# Patient Record
Sex: Female | Born: 1955 | Race: White | Hispanic: No | Marital: Married | Smoking: Former smoker
Health system: Southern US, Community
[De-identification: ages and names within clinical notes are randomized; demographics above are authoritative.]

## PROBLEM LIST (undated history)

## (undated) DIAGNOSIS — C801 Malignant (primary) neoplasm, unspecified: Secondary | ICD-10-CM

## (undated) DIAGNOSIS — S61011A Laceration without foreign body of right thumb without damage to nail, initial encounter: Secondary | ICD-10-CM

## (undated) DIAGNOSIS — T7840XA Allergy, unspecified, initial encounter: Secondary | ICD-10-CM

## (undated) DIAGNOSIS — I10 Essential (primary) hypertension: Secondary | ICD-10-CM

## (undated) DIAGNOSIS — F419 Anxiety disorder, unspecified: Secondary | ICD-10-CM

## (undated) DIAGNOSIS — N6019 Diffuse cystic mastopathy of unspecified breast: Secondary | ICD-10-CM

## (undated) DIAGNOSIS — E785 Hyperlipidemia, unspecified: Secondary | ICD-10-CM

## (undated) DIAGNOSIS — F32A Depression, unspecified: Secondary | ICD-10-CM

## (undated) DIAGNOSIS — E559 Vitamin D deficiency, unspecified: Secondary | ICD-10-CM

## (undated) DIAGNOSIS — F329 Major depressive disorder, single episode, unspecified: Secondary | ICD-10-CM

## (undated) DIAGNOSIS — N951 Menopausal and female climacteric states: Secondary | ICD-10-CM

## (undated) DIAGNOSIS — E7800 Pure hypercholesterolemia, unspecified: Secondary | ICD-10-CM

## (undated) HISTORY — DX: Allergy, unspecified, initial encounter: T78.40XA

## (undated) HISTORY — DX: Diffuse cystic mastopathy of unspecified breast: N60.19

## (undated) HISTORY — DX: Hyperlipidemia, unspecified: E78.5

## (undated) HISTORY — DX: Malignant (primary) neoplasm, unspecified: C80.1

## (undated) HISTORY — DX: Essential (primary) hypertension: I10

## (undated) HISTORY — DX: Vitamin D deficiency, unspecified: E55.9

## (undated) HISTORY — PX: TUBAL LIGATION: SHX77

## (undated) HISTORY — DX: Menopausal and female climacteric states: N95.1

---
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
## 1997-11-27 ENCOUNTER — Emergency Department (HOSPITAL_COMMUNITY): Admission: EM | Admit: 1997-11-27 | Discharge: 1997-11-27 | Payer: Self-pay | Admitting: Emergency Medicine

## 1997-11-27 ENCOUNTER — Encounter: Payer: Self-pay | Admitting: Emergency Medicine

## 1998-12-26 ENCOUNTER — Other Ambulatory Visit: Admission: RE | Admit: 1998-12-26 | Discharge: 1998-12-26 | Payer: Self-pay | Admitting: Internal Medicine

## 2001-01-04 ENCOUNTER — Other Ambulatory Visit: Admission: RE | Admit: 2001-01-04 | Discharge: 2001-01-04 | Payer: Self-pay | Admitting: Internal Medicine

## 2004-02-16 LAB — HM COLONOSCOPY

## 2004-06-17 ENCOUNTER — Other Ambulatory Visit: Admission: RE | Admit: 2004-06-17 | Discharge: 2004-06-17 | Payer: Self-pay | Admitting: Internal Medicine

## 2004-06-29 ENCOUNTER — Ambulatory Visit (HOSPITAL_COMMUNITY): Admission: RE | Admit: 2004-06-29 | Discharge: 2004-06-29 | Payer: Self-pay | Admitting: Internal Medicine

## 2004-07-10 ENCOUNTER — Ambulatory Visit: Payer: Self-pay | Admitting: Gastroenterology

## 2004-07-31 ENCOUNTER — Ambulatory Visit: Payer: Self-pay | Admitting: Gastroenterology

## 2005-08-12 ENCOUNTER — Other Ambulatory Visit: Admission: RE | Admit: 2005-08-12 | Discharge: 2005-08-12 | Payer: Self-pay | Admitting: Internal Medicine

## 2006-04-15 ENCOUNTER — Emergency Department (HOSPITAL_COMMUNITY): Admission: EM | Admit: 2006-04-15 | Discharge: 2006-04-15 | Payer: Self-pay | Admitting: Emergency Medicine

## 2007-12-20 ENCOUNTER — Other Ambulatory Visit: Admission: RE | Admit: 2007-12-20 | Discharge: 2007-12-20 | Payer: Self-pay | Admitting: Internal Medicine

## 2007-12-20 ENCOUNTER — Ambulatory Visit (HOSPITAL_COMMUNITY): Admission: RE | Admit: 2007-12-20 | Discharge: 2007-12-20 | Payer: Self-pay | Admitting: Internal Medicine

## 2009-08-08 ENCOUNTER — Ambulatory Visit (HOSPITAL_COMMUNITY): Admission: RE | Admit: 2009-08-08 | Discharge: 2009-08-08 | Payer: Self-pay | Admitting: Internal Medicine

## 2010-09-01 ENCOUNTER — Other Ambulatory Visit (HOSPITAL_COMMUNITY): Payer: Self-pay | Admitting: Internal Medicine

## 2010-09-01 DIAGNOSIS — Z1231 Encounter for screening mammogram for malignant neoplasm of breast: Secondary | ICD-10-CM

## 2010-09-09 ENCOUNTER — Ambulatory Visit (HOSPITAL_COMMUNITY)
Admission: RE | Admit: 2010-09-09 | Discharge: 2010-09-09 | Disposition: A | Discharge status: Home / Self Care | Payer: 59 | Source: Ambulatory Visit | Attending: Internal Medicine | Admitting: Internal Medicine

## 2010-09-09 DIAGNOSIS — Z1231 Encounter for screening mammogram for malignant neoplasm of breast: Secondary | ICD-10-CM | POA: Insufficient documentation

## 2012-06-07 ENCOUNTER — Other Ambulatory Visit (HOSPITAL_COMMUNITY)
Admission: RE | Admit: 2012-06-07 | Discharge: 2012-06-07 | Disposition: A | Discharge status: Home / Self Care | Payer: 59 | Source: Ambulatory Visit | Attending: Internal Medicine | Admitting: Internal Medicine

## 2012-06-07 DIAGNOSIS — Z1151 Encounter for screening for human papillomavirus (HPV): Secondary | ICD-10-CM | POA: Insufficient documentation

## 2012-06-07 DIAGNOSIS — Z01419 Encounter for gynecological examination (general) (routine) without abnormal findings: Secondary | ICD-10-CM | POA: Insufficient documentation

## 2012-07-13 ENCOUNTER — Encounter: Payer: Self-pay | Admitting: Gastroenterology

## 2012-12-01 ENCOUNTER — Encounter: Payer: Self-pay | Admitting: Internal Medicine

## 2012-12-01 DIAGNOSIS — E559 Vitamin D deficiency, unspecified: Secondary | ICD-10-CM | POA: Insufficient documentation

## 2012-12-01 DIAGNOSIS — E785 Hyperlipidemia, unspecified: Secondary | ICD-10-CM | POA: Insufficient documentation

## 2012-12-01 DIAGNOSIS — I10 Essential (primary) hypertension: Secondary | ICD-10-CM | POA: Insufficient documentation

## 2012-12-21 ENCOUNTER — Ambulatory Visit: Payer: Self-pay | Admitting: Emergency Medicine

## 2013-03-07 ENCOUNTER — Encounter: Payer: Self-pay | Admitting: Internal Medicine

## 2013-03-07 ENCOUNTER — Ambulatory Visit (INDEPENDENT_AMBULATORY_CARE_PROVIDER_SITE_OTHER): Payer: 59 | Admitting: Internal Medicine

## 2013-03-07 VITALS — BP 150/90 | HR 76 | Temp 97.9°F | Resp 16 | Ht 66.0 in | Wt 165.0 lb

## 2013-03-07 DIAGNOSIS — R7401 Elevation of levels of liver transaminase levels: Secondary | ICD-10-CM

## 2013-03-07 DIAGNOSIS — Z0000 Encounter for general adult medical examination without abnormal findings: Secondary | ICD-10-CM

## 2013-03-07 DIAGNOSIS — N6019 Diffuse cystic mastopathy of unspecified breast: Secondary | ICD-10-CM

## 2013-03-07 DIAGNOSIS — Z111 Encounter for screening for respiratory tuberculosis: Secondary | ICD-10-CM

## 2013-03-07 DIAGNOSIS — E782 Mixed hyperlipidemia: Secondary | ICD-10-CM

## 2013-03-07 DIAGNOSIS — Z1212 Encounter for screening for malignant neoplasm of rectum: Secondary | ICD-10-CM

## 2013-03-07 DIAGNOSIS — F411 Generalized anxiety disorder: Secondary | ICD-10-CM

## 2013-03-07 DIAGNOSIS — Z113 Encounter for screening for infections with a predominantly sexual mode of transmission: Secondary | ICD-10-CM

## 2013-03-07 DIAGNOSIS — I10 Essential (primary) hypertension: Secondary | ICD-10-CM

## 2013-03-07 DIAGNOSIS — J019 Acute sinusitis, unspecified: Secondary | ICD-10-CM

## 2013-03-07 DIAGNOSIS — E559 Vitamin D deficiency, unspecified: Secondary | ICD-10-CM

## 2013-03-07 DIAGNOSIS — R740 Nonspecific elevation of levels of transaminase and lactic acid dehydrogenase [LDH]: Secondary | ICD-10-CM

## 2013-03-07 LAB — CBC WITH DIFFERENTIAL/PLATELET
BASOS PCT: 1 % (ref 0–1)
Basophils Absolute: 0 10*3/uL (ref 0.0–0.1)
EOS ABS: 0 10*3/uL (ref 0.0–0.7)
EOS PCT: 1 % (ref 0–5)
HEMATOCRIT: 43.2 % (ref 36.0–46.0)
Hemoglobin: 15.7 g/dL — ABNORMAL HIGH (ref 12.0–15.0)
Lymphocytes Relative: 44 % (ref 12–46)
Lymphs Abs: 1.9 10*3/uL (ref 0.7–4.0)
MCH: 32.7 pg (ref 26.0–34.0)
MCHC: 36.3 g/dL — AB (ref 30.0–36.0)
MCV: 90 fL (ref 78.0–100.0)
Monocytes Absolute: 0.4 10*3/uL (ref 0.1–1.0)
Monocytes Relative: 10 % (ref 3–12)
NEUTROS PCT: 44 % (ref 43–77)
Neutro Abs: 2 10*3/uL (ref 1.7–7.7)
Platelets: 253 10*3/uL (ref 150–400)
RBC: 4.8 MIL/uL (ref 3.87–5.11)
RDW: 13.8 % (ref 11.5–15.5)
WBC: 4.4 10*3/uL (ref 4.0–10.5)

## 2013-03-07 LAB — HEPATIC FUNCTION PANEL
ALT: 26 U/L (ref 0–35)
AST: 23 U/L (ref 0–37)
Albumin: 4.6 g/dL (ref 3.5–5.2)
Alkaline Phosphatase: 79 U/L (ref 39–117)
BILIRUBIN DIRECT: 0.1 mg/dL (ref 0.0–0.3)
BILIRUBIN INDIRECT: 0.4 mg/dL (ref 0.0–0.9)
Total Bilirubin: 0.5 mg/dL (ref 0.3–1.2)
Total Protein: 7 g/dL (ref 6.0–8.3)

## 2013-03-07 LAB — BASIC METABOLIC PANEL WITH GFR
BUN: 9 mg/dL (ref 6–23)
CO2: 29 mEq/L (ref 19–32)
Calcium: 10.1 mg/dL (ref 8.4–10.5)
Chloride: 97 mEq/L (ref 96–112)
Creat: 0.66 mg/dL (ref 0.50–1.10)
GFR, Est Non African American: >89 mL/min
GLUCOSE: 97 mg/dL (ref 70–99)
Potassium: 3.6 mEq/L (ref 3.5–5.3)
Sodium: 136 mEq/L (ref 135–145)

## 2013-03-07 LAB — HEPATITIS B CORE ANTIBODY, TOTAL: HEP B C TOTAL AB: NONREACTIVE

## 2013-03-07 LAB — LIPID PANEL
CHOLESTEROL: 209 mg/dL — AB (ref 0–200)
HDL: 58 mg/dL (ref >39)
LDL Cholesterol: 130 mg/dL — ABNORMAL HIGH (ref 0–99)
TRIGLYCERIDES: 106 mg/dL (ref <150)
Total CHOL/HDL Ratio: 3.6 Ratio
VLDL: 21 mg/dL (ref 0–40)

## 2013-03-07 LAB — TSH: TSH: 1.463 u[IU]/mL (ref 0.350–4.500)

## 2013-03-07 LAB — HIV ANTIBODY (ROUTINE TESTING W REFLEX): HIV: NONREACTIVE

## 2013-03-07 LAB — HEPATITIS A ANTIBODY, TOTAL: HEP A TOTAL AB: NONREACTIVE

## 2013-03-07 LAB — HEPATITIS C ANTIBODY: HCV Ab: NEGATIVE

## 2013-03-07 LAB — MAGNESIUM: Magnesium: 2 mg/dL (ref 1.5–2.5)

## 2013-03-07 LAB — HEPATITIS B SURFACE ANTIBODY,QUALITATIVE: Hep B S Ab: NEGATIVE

## 2013-03-07 LAB — VITAMIN B12: VITAMIN B 12: 586 pg/mL (ref 211–911)

## 2013-03-07 LAB — HEMOGLOBIN A1C
Hgb A1c MFr Bld: 5.4 % (ref <5.7)
MEAN PLASMA GLUCOSE: 108 mg/dL (ref <117)

## 2013-03-07 LAB — RPR

## 2013-03-07 MED ORDER — PREDNISONE 20 MG PO TABS
20.0000 mg | ORAL_TABLET | ORAL | Status: AC
Start: 2013-03-07 — End: 2013-04-07

## 2013-03-07 MED ORDER — PRAVASTATIN SODIUM 40 MG PO TABS: 40.0000 mg | ORAL_TABLET | Freq: Every day | ORAL | Status: DC

## 2013-03-07 MED ORDER — AZITHROMYCIN 250 MG PO TABS: ORAL_TABLET | ORAL | Status: AC

## 2013-03-07 MED ORDER — ALPRAZOLAM 0.25 MG PO TABS: 0.2500 mg | ORAL_TABLET | Freq: Three times a day (TID) | ORAL | Status: DC | PRN

## 2013-03-07 NOTE — Patient Instructions (Signed)

## 2013-03-07 NOTE — Progress Notes (Signed)
Patient ID: Ruth Ward, female   DOB: [birth year], 58 y.o.   MRN: [id number]  Annual Screening Comprehensive Examination  This very nice 58 y.o. MWF presents for complete physical.  Patient has been followed for HTN, Hx/o abnormal glucose, Hyperlipidemia, and Vitamin D Deficiency. She alsoc/o sinus pressure, congestion, and mucopurulent nasal secretions.    HTN predates since  2000. Patient's BP has not been monitored at home and in fact she has been out of her HS Atenolol. Today's BP: 150/90 mmHg. Patient denies any cardiac symptoms as chest pain, palpitations, shortness of breath, dizziness or ankle swelling.   Patient's hyperlipidemia is controlled with diet and medications. Patient denies myalgias or other medication SE's. Last cholesterol last visit was 166, triglycerides 308, HDL 48 and LDL 56 at goal.     Patient is monitored for prediabetes and insulin resistance with last A1c 5.2%. Patient denies reactive hypoglycemic symptoms, visual blurring, diabetic polys, or paresthesias.     Finally, patient has history of Vitamin D Deficiency of 60 in 2008 with last vitamin D 76.       Medication List         ALPRAZolam 0.25 MG tablet  Commonly known as:  XANAX  Take 1 tablet (0.25 mg total) by mouth 3 (three) times daily as needed for anxiety.     aspirin 81 MG tablet  Take 81 mg by mouth daily.     atenolol 100 MG tablet  Commonly known as:  TENORMIN  Take 100 mg by mouth daily. 1/2 DAILY (50MG )     azithromycin 250 MG tablet  Commonly known as:  ZITHROMAX  Take 2 tablets (500 mg) on  Day 1,  followed by 1 tablet (250 mg) once daily on Days 2 through 5.     hydrochlorothiazide 25 MG tablet  Commonly known as:  HYDRODIURIL  Take 25 mg by mouth daily.     pravastatin 40 MG tablet  Commonly known as:  PRAVACHOL  Take 1 tablet (40 mg total) by mouth daily.     predniSONE 20 MG tablet  Commonly known as:  DELTASONE  Take 1 tablet (20 mg total) by mouth See admin  instructions. 1 tab 3 x day for 3 days, then 1 tab 2 x day for 3 days, then 1 tab 1 x day for 5 days     Vitamin D 2000 UNITS tablet  Take 2,000 Units by mouth 2 (two) times daily.        Allergies  Allergen Reactions  . Lipitor [Atorvastatin]     Past Medical History  Diagnosis Date  . Hypertension   . Hyperlipidemia   . Vitamin D deficiency   . Climacteric   . Fibrocystic breast   . Allergy     Past Surgical History  Procedure Laterality Date  . Cesarean section  1975 and 1980  . Tubal ligation      Family History  Problem Relation Age of Onset  . Hypertension Mother   . Diabetes Mother   . Hypertension Father   . Heart disease Father   . Cancer Father   . Hypertension Brother   . Diabetes Other     History  Substance Use Topics  . Smoking status: Former Smoker    Quit date: 02/15/1986  . Smokeless tobacco: Not on file  . Alcohol Use: No    ROS Constitutional: Denies fever, chills, weight loss/gain, headaches, insomnia, fatigue, night sweats, and change in appetite. Eyes: Denies redness, blurred vision, diplopia, discharge,  itchy, watery eyes.  ENT: Denies discharge, post nasal drip, epistaxis, sore throat, earache, hearing loss, dental pain, Tinnitus, Vertigo, , snoring.  Cardio: Denies chest pain, palpitations, irregular heartbeat, syncope, dyspnea, diaphoresis, orthopnea, PND, claudication, edema/. Has sinus pressure, congestion as above. Respiratory: denies cough, dyspnea, DOE, pleurisy, hoarseness, laryngitis, wheezing.  Gastrointestinal: Denies dysphagia, heartburn, reflux, water brash, pain, cramps, nausea, vomiting, bloating, diarrhea, constipation, hematemesis, melena, hematochezia, jaundice, hemorrhoids Genitourinary: Denies dysuria, frequency, urgency, nocturia, hesitancy, discharge, hematuria, flank pain Breast:Breast lumps, nipple discharge, bleeding.  Musculoskeletal: Denies arthralgia, myalgia, stiffness, Jt. Swelling, pain, limp, and  strain/sprain. Skin: Denies puritis, rash, hives, warts, acne, eczema, changing in skin lesion Neuro: No weakness, tremor, incoordination, spasms, paresthesia, pain Psychiatric: Denies confusion, memory loss, sensory loss Endocrine: Denies change in weight, skin, hair change, nocturia, and paresthesia, diabetic polys, visual blurring, hyper / hypo glycemic episodes.  Heme/Lymph: No excessive bleeding, bruising, enlarged lymph nodes.  BP: 150/90  Pulse: 76  Temp: 97.9 F (36.6 C)  Resp: 16    Physical Exam General Appearance: Well nourished, in no apparent distress. Eyes: PERRLA, EOMs, conjunctiva no swelling or erythema, normal fundi and vessels. Sinuses: No frontal but + maxillary tenderness ENT/Mouth: EACs patent / TMs  nl. Nares clear without erythema, swelling, mucoid exudates. Oral hygiene is good. No erythema, swelling, or exudate. Tongue normal, non-obstructing. Tonsils not swollen or erythematous. Hearing normal.  Neck: Supple, thyroid normal. No bruits, nodes or JVD. Respiratory: Respiratory effort normal.  BS equal and clear bilateral without rales, rhonci, wheezing or stridor. Cardio: Heart sounds are normal with regular rate and rhythm and no murmurs, rubs or gallops. Peripheral pulses are normal and equal bilaterally without edema. No aortic or femoral bruits. Chest: symmetric with normal excursions and percussion. Breasts: Symmetric, without lumps, nipple discharge, retractions, or fibrocystic changes.  Abdomen: Flat, soft, with bowl sounds. Nontender, no guarding, rebound, hernias, masses, or organomegaly.  Lymphatics: Non tender without lymphadenopathy.  Musculoskeletal: Full ROM all peripheral extremities, joint stability, 5/5 strength, and normal gait. Skin: Warm and dry without rashes, lesions, cyanosis, clubbing or  ecchymosis.  Neuro: Cranial nerves intact, reflexes equal bilaterally. Normal muscle tone, no cerebellar symptoms. Sensation intact.  Pysch: Awake and  oriented X 3, normal affect, Insight and Judgment appropriate.   Assessment and Plan  1. Annual Screening Examination 2. Hypertension  3. Hyperlipidemia 4. Pre Diabetes Screening 5. Vitamin D Deficiency 6. Sinusitis  Continue prudent diet as discussed, weight control, BP monitoring, regular exercise, and medications. Discussed med's effects and SE's. Screening labs and tests as requested with regular follow-up as recommended.

## 2013-03-08 LAB — URINALYSIS, MICROSCOPIC ONLY
BACTERIA UA: NONE SEEN
Casts: NONE SEEN
Crystals: NONE SEEN
SQUAMOUS EPITHELIAL / LPF: NONE SEEN

## 2013-03-08 LAB — VITAMIN D 25 HYDROXY (VIT D DEFICIENCY, FRACTURES): VIT D 25 HYDROXY: 84 ng/mL (ref 30–89)

## 2013-03-08 LAB — MICROALBUMIN / CREATININE URINE RATIO
CREATININE, URINE: 86.5 mg/dL
MICROALB UR: 1.1 mg/dL (ref 0.00–1.89)
MICROALB/CREAT RATIO: 12.7 mg/g (ref 0.0–30.0)

## 2013-03-08 LAB — HEPATITIS B E ANTIBODY: HEPATITIS BE ANTIBODY: NEGATIVE

## 2013-03-08 LAB — INSULIN, FASTING: INSULIN FASTING, SERUM: 11 u[IU]/mL (ref 3–28)

## 2013-03-09 ENCOUNTER — Encounter: Payer: Self-pay | Admitting: Internal Medicine

## 2013-03-09 LAB — TB SKIN TEST
Induration: 0 mm
TB Skin Test: NEGATIVE

## 2013-03-12 ENCOUNTER — Other Ambulatory Visit: Payer: Self-pay | Admitting: Emergency Medicine

## 2013-03-12 ENCOUNTER — Encounter: Payer: Self-pay | Admitting: Internal Medicine

## 2013-03-12 MED ORDER — HYDROCHLOROTHIAZIDE 25 MG PO TABS: 25.0000 mg | ORAL_TABLET | Freq: Every day | ORAL | Status: DC

## 2013-04-02 ENCOUNTER — Ambulatory Visit (HOSPITAL_COMMUNITY): Admission: RE | Admit: 2013-04-02 | Payer: 59 | Source: Ambulatory Visit

## 2013-04-09 ENCOUNTER — Other Ambulatory Visit: Payer: Self-pay | Admitting: Internal Medicine

## 2013-04-09 ENCOUNTER — Ambulatory Visit (HOSPITAL_COMMUNITY)
Admission: RE | Admit: 2013-04-09 | Discharge: 2013-04-09 | Disposition: A | Discharge status: Home / Self Care | Payer: 59 | Source: Ambulatory Visit | Attending: Internal Medicine | Admitting: Internal Medicine

## 2013-04-09 DIAGNOSIS — Z1231 Encounter for screening mammogram for malignant neoplasm of breast: Secondary | ICD-10-CM | POA: Insufficient documentation

## 2013-04-09 DIAGNOSIS — N6019 Diffuse cystic mastopathy of unspecified breast: Secondary | ICD-10-CM

## 2013-05-08 ENCOUNTER — Encounter: Payer: Self-pay | Admitting: Internal Medicine

## 2013-05-08 ENCOUNTER — Ambulatory Visit (INDEPENDENT_AMBULATORY_CARE_PROVIDER_SITE_OTHER): Payer: 59 | Admitting: Internal Medicine

## 2013-05-08 VITALS — BP 122/76 | HR 56 | Temp 98.1°F | Resp 18 | Ht 66.0 in | Wt 161.0 lb

## 2013-05-08 DIAGNOSIS — F411 Generalized anxiety disorder: Secondary | ICD-10-CM

## 2013-05-08 DIAGNOSIS — I10 Essential (primary) hypertension: Secondary | ICD-10-CM

## 2013-05-08 MED ORDER — SERTRALINE HCL 50 MG PO TABS: 50.0000 mg | ORAL_TABLET | Freq: Every day | ORAL | Status: DC

## 2013-05-08 MED ORDER — ALPRAZOLAM 1 MG PO TABS: ORAL_TABLET | ORAL | Status: DC

## 2013-05-08 NOTE — Progress Notes (Signed)
   Subjective:    Patient ID: Ruth Ward, female    DOB: [birth year], 58 y.o.   MRN: [id number]  HPI Very nice 58 yo MWF presents for F/U of elevated BP 159/90 six weeks ago at her annual CPE. Since then she has been dealing with anxiety issues arising from some marital challenges which she and her spouse are working to try and resolve. She has had more frequent anxiety episode and hence has been using her alprazolam more frequently.  Medication Sig  . aspirin 81 MG tablet Take 81 mg by mouth daily.  Marland Kitchen atenolol (TENORMIN) 100 MG tablet Take 100 mg by mouth daily. 1/2 DAILY (50MG )  . Cholecalciferol (VITAMIN D) 2000 UNITS tablet Take 2,000 Units by mouth 2 (two) times daily.  . hydrochlorothiazide (HYDRODIURIL) 25 MG tablet Take 1 tablet (25 mg total) by mouth daily.  . pravastatin (PRAVACHOL) 40 MG tablet Take 1 tablet (40 mg total) by mouth daily.   Allergies  Allergen Reactions  . Lipitor [Atorvastatin]    Past Medical History  Diagnosis Date  . Hypertension   . Hyperlipidemia   . Vitamin D deficiency   . Climacteric   . Fibrocystic breast   . Allergy   Review of Systems   Negative to above.     Objective:   Physical Exam  BP 122/76  Pulse 56  Temp98.1 F   Resp 18  Ht 5\' 6"    Wt 161 lb   BMI 26.00 kg/m2  HEENT - Eac's patent. TM's Nl.EOM's full. PERRLA. NasoOroPharynx clear. Neck - supple. Nl Thyroid. No bruits nodes JVD Chest - Clear equal BS Cor - Nl HS. RRR w/o sig MGR. PP 1(+) No edema. Abd - No palpable organomegaly, masses or tenderness. BS nl. MS- FROM. w/o deformities. Muscle power tone and bulk Nl. Gait Nl. Neuro - No obvious Cr N abnormalities. Sensory, motor and Cerebellar functions appear Nl w/o focal abnormalities. Psyche - stable w/o ideations, delusions or hallucinations  Assessment & Plan:  1. Hypertension  2. Anxiety  - sertraline (ZOLOFT) 50 MG tablet; Take 1 tablet (50 mg total) by mouth daily. For mood  To start 1/2 tab 1st two weeks  then increase to 1 whole tab daily  - ALPRAZolam (XANAX) 1 MG tablet; Talke 1/2 to 1 tablet 2 to 3 x daily as needed for anxiety  Dispense: 90 tablet; Refill: 3 and instructed to begin to try and wean in 2 or 3 weeks after she's been on the sertraline.  - ROV 6-8 weeks

## 2013-06-05 ENCOUNTER — Ambulatory Visit: Payer: Self-pay | Admitting: Physician Assistant

## 2013-06-06 ENCOUNTER — Other Ambulatory Visit: Payer: Self-pay | Admitting: Emergency Medicine

## 2013-06-06 ENCOUNTER — Encounter: Payer: Self-pay | Admitting: Internal Medicine

## 2013-06-06 DIAGNOSIS — E782 Mixed hyperlipidemia: Secondary | ICD-10-CM

## 2013-06-06 DIAGNOSIS — F411 Generalized anxiety disorder: Secondary | ICD-10-CM

## 2013-06-06 MED ORDER — PRAVASTATIN SODIUM 40 MG PO TABS: 40.0000 mg | ORAL_TABLET | Freq: Every day | ORAL | Status: DC

## 2013-06-06 MED ORDER — SERTRALINE HCL 50 MG PO TABS: 50.0000 mg | ORAL_TABLET | Freq: Every day | ORAL | Status: DC

## 2013-06-08 ENCOUNTER — Ambulatory Visit: Payer: Self-pay | Admitting: Physician Assistant

## 2013-07-05 ENCOUNTER — Ambulatory Visit (INDEPENDENT_AMBULATORY_CARE_PROVIDER_SITE_OTHER): Payer: 59 | Admitting: Internal Medicine

## 2013-07-05 ENCOUNTER — Encounter: Payer: Self-pay | Admitting: Internal Medicine

## 2013-07-05 VITALS — BP 128/76 | HR 60 | Temp 99.0°F | Resp 16

## 2013-07-05 DIAGNOSIS — F411 Generalized anxiety disorder: Secondary | ICD-10-CM

## 2013-07-05 DIAGNOSIS — I10 Essential (primary) hypertension: Secondary | ICD-10-CM
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
## 2013-07-09 ENCOUNTER — Other Ambulatory Visit: Payer: Self-pay | Admitting: Emergency Medicine

## 2013-07-27 ENCOUNTER — Encounter: Payer: Self-pay | Admitting: Internal Medicine

## 2013-09-10 ENCOUNTER — Ambulatory Visit: Payer: Self-pay | Admitting: Internal Medicine

## 2013-10-09 ENCOUNTER — Encounter: Payer: Self-pay | Admitting: Internal Medicine

## 2013-10-09 ENCOUNTER — Ambulatory Visit (INDEPENDENT_AMBULATORY_CARE_PROVIDER_SITE_OTHER): Payer: 59 | Admitting: Internal Medicine

## 2013-10-09 VITALS — BP 126/72 | HR 56 | Temp 98.6°F | Resp 16 | Ht 66.0 in | Wt 140.0 lb

## 2013-10-09 DIAGNOSIS — Z79899 Other long term (current) drug therapy: Secondary | ICD-10-CM

## 2013-10-09 DIAGNOSIS — R7309 Other abnormal glucose: Secondary | ICD-10-CM | POA: Insufficient documentation

## 2013-10-09 DIAGNOSIS — E785 Hyperlipidemia, unspecified: Secondary | ICD-10-CM

## 2013-10-09 DIAGNOSIS — I10 Essential (primary) hypertension: Secondary | ICD-10-CM

## 2013-10-09 DIAGNOSIS — E559 Vitamin D deficiency, unspecified: Secondary | ICD-10-CM

## 2013-10-09 DIAGNOSIS — F411 Generalized anxiety disorder: Secondary | ICD-10-CM

## 2013-10-09 LAB — HEMOGLOBIN A1C
HEMOGLOBIN A1C: 5.3 % (ref <5.7)
MEAN PLASMA GLUCOSE: 105 mg/dL (ref <117)

## 2013-10-09 LAB — CBC WITH DIFFERENTIAL/PLATELET
BASOS PCT: 0 % (ref 0–1)
Basophils Absolute: 0 10*3/uL (ref 0.0–0.1)
EOS ABS: 0.1 10*3/uL (ref 0.0–0.7)
Eosinophils Relative: 1 % (ref 0–5)
HCT: 41.2 % (ref 36.0–46.0)
Hemoglobin: 14.2 g/dL (ref 12.0–15.0)
LYMPHS ABS: 2.4 10*3/uL (ref 0.7–4.0)
Lymphocytes Relative: 43 % (ref 12–46)
MCH: 31.8 pg (ref 26.0–34.0)
MCHC: 34.5 g/dL (ref 30.0–36.0)
MCV: 92.4 fL (ref 78.0–100.0)
Monocytes Absolute: 0.4 10*3/uL (ref 0.1–1.0)
Monocytes Relative: 7 % (ref 3–12)
NEUTROS PCT: 49 % (ref 43–77)
Neutro Abs: 2.7 10*3/uL (ref 1.7–7.7)
PLATELETS: 212 10*3/uL (ref 150–400)
RBC: 4.46 MIL/uL (ref 3.87–5.11)
RDW: 13 % (ref 11.5–15.5)
WBC: 5.5 10*3/uL (ref 4.0–10.5)

## 2013-10-09 MED ORDER — HYDROCHLOROTHIAZIDE 25 MG PO TABS: 25.0000 mg | ORAL_TABLET | Freq: Every day | ORAL | Status: DC

## 2013-10-09 MED ORDER — ATENOLOL 100 MG PO TABS: 100.0000 mg | ORAL_TABLET | Freq: Every day | ORAL | Status: DC

## 2013-10-09 MED ORDER — ALPRAZOLAM 1 MG PO TABS: ORAL_TABLET | ORAL | Status: DC

## 2013-10-09 NOTE — Progress Notes (Signed)
Patient ID: Ruth Ward, female   DOB: Dec 28, 1955, 58 y.o.   MRN: [id number]   This very nice 58 y.o.MWF presents for 3 month follow up with Hypertension, Hyperlipidemia, Pre-Diabetes and Vitamin D Deficiency.    Patient is treated for HTN & BP has been controlled at home. Today's BP: 126/72 mmHg. Patient denies any cardiac type chest pain, palpitations, dyspnea/orthopnea/PND, dizziness, claudication, or dependent edema.   Hyperlipidemia is controlled with diet & meds. Patient denies myalgias or other med SE's. Last Lipids were Chol 209*; HDL Chol 58; LDL 130*; Trig 106 on 03/07/2013:   Also, the patient has history of PreDiabetes and patient denies any symptoms of reactive hypoglycemia, diabetic polys, paresthesias or visual blurring.  Last A1c was  5.4% on  03/07/2013.    Further, Patient has history of Vitamin D Deficiency and patient supplements vitamin D without any suspected side-effects. Last vitamin D was  84 on  03/07/2013.   Medication List   ALPRAZolam 1 MG tablet  Commonly known as:  XANAX  Tal\ke 1/2 to 1 tablet 2 to 3 x daily as needed for anxiety     aspirin 81 MG tablet  Take 81 mg by mouth daily.     atenolol 100 MG tablet  Commonly known as:  TENORMIN  Take 1 tablet (100 mg total) by mouth daily. For BP     hydrochlorothiazide 25 MG tablet  Commonly known as:  HYDRODIURIL  Take 1 tablet (25 mg total) by mouth daily.     pravastatin 40 MG tablet  Commonly known as:  PRAVACHOL  Take 1 tablet (40 mg total) by mouth daily.     sertraline 50 MG tablet  Commonly known as:  ZOLOFT  Take 1 tablet (50 mg total) by mouth daily. For mood     Vitamin D 2000 UNITS tablet  Take 2,000 Units by mouth 2 (two) times daily.     Allergies  Allergen Reactions  . Lipitor [Atorvastatin]    PMHx:   Past Medical History  Diagnosis Date  . Hypertension   . Hyperlipidemia   . Vitamin D deficiency   . Climacteric   . Fibrocystic breast   . Allergy    FHx:    Reviewed /  unchanged SHx:    Reviewed / unchanged  Systems Review:  Constitutional: Denies fever, chills, wt changes, headaches, insomnia, fatigue, night sweats, change in appetite. Eyes: Denies redness, blurred vision, diplopia, discharge, itchy, watery eyes.  ENT: Denies discharge, congestion, post nasal drip, epistaxis, sore throat, earache, hearing loss, dental pain, tinnitus, vertigo, sinus pain, snoring.  CV: Denies chest pain, palpitations, irregular heartbeat, syncope, dyspnea, diaphoresis, orthopnea, PND, claudication or edema. Respiratory: denies cough, dyspnea, DOE, pleurisy, hoarseness, laryngitis, wheezing.  Gastrointestinal: Denies dysphagia, odynophagia, heartburn, reflux, water brash, abdominal pain or cramps, nausea, vomiting, bloating, diarrhea, constipation, hematemesis, melena, hematochezia  or hemorrhoids. Genitourinary: Denies dysuria, frequency, urgency, nocturia, hesitancy, discharge, hematuria or flank pain. Musculoskeletal: Denies arthralgias, myalgias, stiffness, jt. swelling, pain, limping or strain/sprain.  Skin: Denies pruritus, rash, hives, warts, acne, eczema or change in skin lesion(s). Neuro: No weakness, tremor, incoordination, spasms, paresthesia or pain. Psychiatric: Denies confusion, memory loss or sensory loss. Endo: Denies change in weight, skin or hair change.  Heme/Lymph: No excessive bleeding, bruising or enlarged lymph nodes.  Exam:  BP 126/72  Pulse 56  Temp(Src) 98.6 F (37 C) (Temporal)  Resp 16  Ht 5\' 6"  (1.676 m)  Wt 140 lb (63.504 kg)  BMI 22.61 kg/m2  Appears  well nourished and in no distress. Eyes: PERRLA, EOMs, conjunctiva no swelling or erythema. Sinuses: No frontal/maxillary tenderness ENT/Mouth: EAC's clear, TM's nl w/o erythema, bulging. Nares clear w/o erythema, swelling, exudates. Oropharynx clear without erythema or exudates. Oral hygiene is good. Tongue normal, non obstructing. Hearing intact.  Neck: Supple. Thyroid nl. Car 2+/2+  without bruits, nodes or JVD. Chest: Respirations nl with BS clear & equal w/o rales, rhonchi, wheezing or stridor.  Cor: Heart sounds normal w/ regular rate and rhythm without sig. murmurs, gallops, clicks, or rubs. Peripheral pulses normal and equal  without edema.  Abdomen: Soft & bowel sounds normal. Non-tender w/o guarding, rebound, hernias, masses, or organomegaly.  Lymphatics: Unremarkable.  Musculoskeletal: Full ROM all peripheral extremities, joint stability, 5/5 strength, and normal gait.  Skin: Warm, dry without exposed rashes, lesions or ecchymosis apparent.  Neuro: Cranial nerves intact, reflexes equal bilaterally. Sensory-motor testing grossly intact. Tendon reflexes grossly intact.  Pysch: Alert & oriented x 3.  Insight and judgement nl & appropriate. No ideations.  Assessment and Plan:  1. Hypertension - Continue monitor blood pressure at home. Continue diet/meds same.  2. Hyperlipidemia - Continue diet/meds, exercise,& lifestyle modifications. Continue monitor periodic cholesterol/liver & renal functions   3. Abn. Glucose, screening - Continue diet, exercise, lifestyle modifications. Monitor appropriate labs.   4. Vitamin D Deficiency - Continue supplementation.  Recommended regular exercise, BP monitoring, weight control, and discussed med and SE's. Recommended labs to assess and monitor clinical status. Further disposition pending results of labs.

## 2013-10-09 NOTE — Patient Instructions (Signed)

## 2013-10-10 LAB — BASIC METABOLIC PANEL WITH GFR
BUN: 14 mg/dL (ref 6–23)
CALCIUM: 9.2 mg/dL (ref 8.4–10.5)
CO2: 30 mEq/L (ref 19–32)
CREATININE: 0.69 mg/dL (ref 0.50–1.10)
Chloride: 99 mEq/L (ref 96–112)
GFR, Est Non African American: >89 mL/min
Glucose, Bld: 80 mg/dL (ref 70–99)
Potassium: 3.5 mEq/L (ref 3.5–5.3)
Sodium: 138 mEq/L (ref 135–145)

## 2013-10-10 LAB — HEPATIC FUNCTION PANEL
ALBUMIN: 4.5 g/dL (ref 3.5–5.2)
ALT: 23 U/L (ref 0–35)
AST: 20 U/L (ref 0–37)
Alkaline Phosphatase: 89 U/L (ref 39–117)
BILIRUBIN DIRECT: 0.1 mg/dL (ref 0.0–0.3)
BILIRUBIN TOTAL: 0.3 mg/dL (ref 0.2–1.2)
Indirect Bilirubin: 0.2 mg/dL (ref 0.2–1.2)
Total Protein: 6.5 g/dL (ref 6.0–8.3)

## 2013-10-10 LAB — LIPID PANEL
CHOL/HDL RATIO: 2.6 ratio
Cholesterol: 145 mg/dL (ref 0–200)
HDL: 55 mg/dL (ref >39)
LDL Cholesterol: 54 mg/dL (ref 0–99)
Triglycerides: 178 mg/dL — ABNORMAL HIGH (ref <150)
VLDL: 36 mg/dL (ref 0–40)

## 2013-10-10 LAB — MAGNESIUM: MAGNESIUM: 2 mg/dL (ref 1.5–2.5)

## 2013-10-10 LAB — VITAMIN D 25 HYDROXY (VIT D DEFICIENCY, FRACTURES): Vit D, 25-Hydroxy: 78 ng/mL (ref 30–89)

## 2013-10-10 LAB — INSULIN, FASTING: INSULIN FASTING, SERUM: 5.6 u[IU]/mL (ref 2.0–19.6)

## 2013-10-10 LAB — TSH: TSH: 1.631 u[IU]/mL (ref 0.350–4.500)

## 2013-11-30 ENCOUNTER — Encounter: Payer: Self-pay | Admitting: Internal Medicine

## 2013-11-30 ENCOUNTER — Other Ambulatory Visit: Payer: Self-pay

## 2014-01-28 ENCOUNTER — Ambulatory Visit (INDEPENDENT_AMBULATORY_CARE_PROVIDER_SITE_OTHER): Payer: 59 | Admitting: Physician Assistant

## 2014-01-28 VITALS — BP 128/78 | HR 60 | Temp 98.1°F | Resp 16 | Ht 66.0 in | Wt 142.0 lb

## 2014-01-28 DIAGNOSIS — T7840XA Allergy, unspecified, initial encounter: Secondary | ICD-10-CM

## 2014-01-28 DIAGNOSIS — E559 Vitamin D deficiency, unspecified: Secondary | ICD-10-CM

## 2014-01-28 DIAGNOSIS — E785 Hyperlipidemia, unspecified: Secondary | ICD-10-CM

## 2014-01-28 DIAGNOSIS — Z79899 Other long term (current) drug therapy: Secondary | ICD-10-CM

## 2014-01-28 DIAGNOSIS — L219 Seborrheic dermatitis, unspecified: Secondary | ICD-10-CM

## 2014-01-28 DIAGNOSIS — R7303 Prediabetes: Secondary | ICD-10-CM

## 2014-01-28 DIAGNOSIS — I10 Essential (primary) hypertension: Secondary | ICD-10-CM

## 2014-01-28 MED ORDER — DEXAMETHASONE SODIUM PHOSPHATE 100 MG/10ML IJ SOLN
10.0000 mg | Freq: Once | INTRAMUSCULAR | Status: AC
  Administered 2014-01-28: 10 mg via INTRAMUSCULAR

## 2014-01-28 MED ORDER — TRIAMCINOLONE ACETONIDE 0.1 % EX CREA: 1.0000 "application " | TOPICAL_CREAM | Freq: Two times a day (BID) | CUTANEOUS | Status: DC

## 2014-01-28 NOTE — Progress Notes (Signed)
Assessment and Plan:  Hypertension: Continue medication, monitor blood pressure at home. Continue DASH diet.  Reminder to go to the ER if any CP, SOB, nausea, dizziness, severe HA, changes vision/speech, left arm numbness and tingling, and jaw pain. Cholesterol: Continue diet and exercise. Check cholesterol.  Pre-diabetes-Continue diet and exercise.  Vitamin D Def- check level and continue medications.  Seb Dermatitis- info give, prednisone shot in the office, triamcinolone sent in, can use baby shampoo, call if it does not get better/gets worse  Continue diet and meds as discussed. She would prefer to wait until her CPE for labs due to insurance/cost. All labs were good last time so we will wait until her CPE.   HPI 58 y.o. female  presents for 3 month follow up with hypertension, hyperlipidemia, prediabetes and vitamin D. Her blood pressure has been controlled at home, she is on atenolol and HCTZ, today their BP is BP: 128/78 mmHg She does workout. She denies chest pain, shortness of breath, dizziness.  She is on cholesterol medication, pravastatin 40mg  and denies myalgias. Her cholesterol is at goal. The cholesterol last visit was:   Lab Results  Component Value Date   CHOL 145 10/09/2013   HDL 55 10/09/2013   LDLCALC 54 10/09/2013   TRIG 178* 10/09/2013   CHOLHDL 2.6 10/09/2013    Last A1C in the office was:  Lab Results  Component Value Date   HGBA1C 5.3 10/09/2013   Patient is on Vitamin D supplement.   Lab Results  Component Value Date   VD25OH 78 10/09/2013     She states that for the past 3 weeks she has had bilateral eye itching and swelling, she had this happen several years ago and had an injection that made it better. She wears contacts. She states it is not her eyes that itch but just the eye lids, she had stopped wearing make up and has put gold bond/cold rag that has helped some. She has been outside more and around a cat that increases allergies. She denies blurry  vision, eye pain, discharge, fever, chills, joint pain, rash.   Current Medications:  Current Outpatient Prescriptions on File Prior to Visit  Medication Sig Dispense Refill  . ALPRAZolam (XANAX) 1 MG tablet Tal\ke 1/2 to 1 tablet 2 to 3 x daily as needed for anxiety 90 tablet 3  . aspirin 81 MG tablet Take 81 mg by mouth daily.    Marland Kitchen atenolol (TENORMIN) 100 MG tablet Take 1 tablet (100 mg total) by mouth daily. For BP 90 tablet 99  . Cholecalciferol (VITAMIN D) 2000 UNITS tablet Take 2,000 Units by mouth 2 (two) times daily.    . hydrochlorothiazide (HYDRODIURIL) 25 MG tablet Take 1 tablet (25 mg total) by mouth daily. 90 tablet 99  . pravastatin (PRAVACHOL) 40 MG tablet Take 1 tablet (40 mg total) by mouth daily. 90 tablet 1  . sertraline (ZOLOFT) 50 MG tablet Take 1 tablet (50 mg total) by mouth daily. For mood 90 tablet 1   No current facility-administered medications on file prior to visit.   Medical History:  Past Medical History  Diagnosis Date  . Hypertension   . Hyperlipidemia   . Vitamin D deficiency   . Climacteric   . Fibrocystic breast   . Allergy    Allergies:  Allergies  Allergen Reactions  . Lipitor [Atorvastatin]      Review of Systems:  Review of Systems  Constitutional: Negative.   HENT: Negative.   Eyes: Negative for  blurred vision, double vision, photophobia, pain, discharge and redness.  Respiratory: Negative.   Cardiovascular: Negative.   Gastrointestinal: Negative.   Genitourinary: Negative.   Musculoskeletal: Negative.   Skin: Positive for rash (bilateral eyes). Negative for itching.  Neurological: Negative.   Psychiatric/Behavioral: Negative.      Family history- Review and unchanged Social history- Review and unchanged Physical Exam: BP 128/78 mmHg  Pulse 60  Temp(Src) 98.1 F (36.7 C)  Resp 16  Ht 5\' 6"  (1.676 m)  Wt 142 lb (64.411 kg)  BMI 22.93 kg/m2 Wt Readings from Last 3 Encounters:  01/28/14 142 lb (64.411 kg)  10/09/13 140  lb (63.504 kg)  05/08/13 161 lb (73.029 kg)   General Appearance: Well nourished, in no apparent distress. Eyes: PERRLA, EOMs, conjunctiva no swelling or erythema Sinuses: No Frontal/maxillary tenderness ENT/Mouth: Ext aud canals clear, TMs without erythema, bulging. No erythema, swelling, or exudate on post pharynx.  Tonsils not swollen or erythematous. Hearing normal.  Neck: Supple, thyroid normal.  Respiratory: Respiratory effort normal, BS equal bilaterally without rales, rhonchi, wheezing or stridor.  Cardio: RRR with no MRGs. Brisk peripheral pulses without edema.  Abdomen: Soft, + BS.  Non tender, no guarding, rebound, hernias, masses. Lymphatics: Non tender without lymphadenopathy.  Musculoskeletal: Full ROM, 5/5 strength, normal gait.  Skin: Bilateral eye lids erythematous with dr/scaly skin. Warm, dry without lesions, ecchymosis.  Neuro: Cranial nerves intact. Normal muscle tone, no cerebellar symptoms. Sensation intact.  Psych: Awake and oriented X 3, normal affect, Insight and Judgment appropriate.    Vicie Mutters, PA-C 11:36 AM [facility]

## 2014-01-28 NOTE — Patient Instructions (Signed)
Seborrheic Dermatitis °Seborrheic dermatitis involves pink or red skin with greasy, flaky scales. This is often found on the scalp, eyebrows, nose, bearded area, and on or behind the ears. It can also occur on the central chest. It often occurs where there are more oil (sebaceous) glands. This condition is also known as dandruff. When this condition affects a baby's scalp, it is called cradle cap. It may come and go for no known reason. It can occur at any time of life from infancy to old age. °CAUSES  °The cause is unknown. It is not the result of too little moisture or too much oil. In some people, seborrheic dermatitis flare-ups seem to be triggered by stress. It also commonly occurs in people with certain diseases such as Parkinson's disease or HIV/AIDS. °SYMPTOMS  °· Thick scales on the scalp. °· Redness on the face or in the armpits. °· The skin may seem oily or dry, but moisturizers do not help. °· In infants, seborrheic dermatitis appears as scaly redness that does not seem to bother the baby. In some babies, it affects only the scalp. In others, it also affects the neck creases, armpits, groin, or behind the ears. °· In adults and adolescents, seborrheic dermatitis may affect only the scalp. It may look patchy or spread out, with areas of redness and flaking. Other areas commonly affected include: °¨ Eyebrows. °¨ Eyelids. °¨ Forehead. °¨ Skin behind the ears. °¨ Outer ears. °¨ Chest. °¨ Armpits. °¨ Nose creases. °¨ Skin creases under the breasts. °¨ Skin between the buttocks. °¨ Groin. °· Some adults and adolescents feel itching or burning in the affected areas. °DIAGNOSIS  °Your caregiver can usually tell what the problem is by doing a physical exam. °TREATMENT  °· Cortisone (steroid) ointments, creams, and lotions can help decrease inflammation. °· Babies can be treated with baby oil to soften the scales, then they may be washed with baby shampoo. If this does not help, a prescription topical steroid  medicine may work. °· Adults can use medicated shampoos. °· Your caregiver may prescribe corticosteroid cream and shampoo containing an antifungal or yeast medicine (ketoconazole). Hydrocortisone or anti-yeast cream can be rubbed directly onto seborrheic dermatitis patches. Yeast does not cause seborrheic dermatitis, but it seems to add to the problem. °In infants, seborrheic dermatitis is often worst during the first year of life. It tends to disappear on its own as the child grows. However, it may return during the teenage years. In adults and adolescents, seborrheic dermatitis tends to be a long-lasting condition that comes and goes over many years. °HOME CARE INSTRUCTIONS  °· Use prescribed medicines as directed. °· In infants, do not aggressively remove the scales or flakes on the scalp with a comb or by other means. This may lead to hair loss. °SEEK MEDICAL CARE IF:  °· The problem does not improve from the medicated shampoos, lotions, or other medicines given by your caregiver. °· You have any other questions or concerns. °Document Released: 02/01/2005 Document Revised: 08/03/2011 Document Reviewed: 06/23/2009 °[employer]® Patient Information ©2015 [employer]. This information is not intended to replace advice given to you by your health care provider. Make sure you discuss any questions you have with your health care provider. ° °

## 2014-02-18 ENCOUNTER — Other Ambulatory Visit: Payer: Self-pay | Admitting: Internal Medicine

## 2014-03-07 ENCOUNTER — Encounter: Payer: Self-pay | Admitting: Internal Medicine

## 2014-04-09 ENCOUNTER — Encounter: Payer: Self-pay | Admitting: Gastroenterology

## 2014-04-15 ENCOUNTER — Encounter: Payer: Self-pay | Admitting: Internal Medicine

## 2014-05-02 ENCOUNTER — Encounter: Payer: Self-pay | Admitting: Internal Medicine

## 2014-05-02 ENCOUNTER — Ambulatory Visit (INDEPENDENT_AMBULATORY_CARE_PROVIDER_SITE_OTHER): Payer: 59 | Admitting: Internal Medicine

## 2014-05-02 VITALS — BP 132/80 | HR 68 | Temp 97.5°F | Resp 16 | Ht 66.5 in | Wt 152.6 lb

## 2014-05-02 DIAGNOSIS — R7303 Prediabetes: Secondary | ICD-10-CM

## 2014-05-02 DIAGNOSIS — E785 Hyperlipidemia, unspecified: Secondary | ICD-10-CM

## 2014-05-02 DIAGNOSIS — I10 Essential (primary) hypertension: Secondary | ICD-10-CM

## 2014-05-02 DIAGNOSIS — R5383 Other fatigue: Secondary | ICD-10-CM

## 2014-05-02 DIAGNOSIS — F3341 Major depressive disorder, recurrent, in partial remission: Secondary | ICD-10-CM | POA: Insufficient documentation

## 2014-05-02 DIAGNOSIS — R7309 Other abnormal glucose: Secondary | ICD-10-CM

## 2014-05-02 DIAGNOSIS — Z111 Encounter for screening for respiratory tuberculosis: Secondary | ICD-10-CM

## 2014-05-02 DIAGNOSIS — Z79899 Other long term (current) drug therapy: Secondary | ICD-10-CM

## 2014-05-02 DIAGNOSIS — E559 Vitamin D deficiency, unspecified: Secondary | ICD-10-CM

## 2014-05-02 DIAGNOSIS — F3342 Major depressive disorder, recurrent, in full remission: Secondary | ICD-10-CM | POA: Insufficient documentation

## 2014-05-02 DIAGNOSIS — Z1212 Encounter for screening for malignant neoplasm of rectum: Secondary | ICD-10-CM

## 2014-05-02 LAB — BASIC METABOLIC PANEL WITH GFR
BUN: 12 mg/dL (ref 6–23)
CO2: 28 meq/L (ref 19–32)
Calcium: 9.6 mg/dL (ref 8.4–10.5)
Chloride: 102 mEq/L (ref 96–112)
Creat: 0.61 mg/dL (ref 0.50–1.10)
GFR, Est African American: >89 mL/min
GFR, Est Non African American: >89 mL/min
Glucose, Bld: 70 mg/dL (ref 70–99)
Potassium: 4.1 mEq/L (ref 3.5–5.3)
SODIUM: 140 meq/L (ref 135–145)

## 2014-05-02 LAB — LIPID PANEL
CHOLESTEROL: 158 mg/dL (ref 0–200)
HDL: 57 mg/dL (ref >=46)
LDL Cholesterol: 63 mg/dL (ref 0–99)
Total CHOL/HDL Ratio: 2.8 Ratio
Triglycerides: 188 mg/dL — ABNORMAL HIGH (ref <150)
VLDL: 38 mg/dL (ref 0–40)

## 2014-05-02 LAB — HEPATIC FUNCTION PANEL
ALT: 36 U/L — ABNORMAL HIGH (ref 0–35)
AST: 24 U/L (ref 0–37)
Albumin: 4.5 g/dL (ref 3.5–5.2)
Alkaline Phosphatase: 87 U/L (ref 39–117)
BILIRUBIN INDIRECT: 0.3 mg/dL (ref 0.2–1.2)
BILIRUBIN TOTAL: 0.4 mg/dL (ref 0.2–1.2)
Bilirubin, Direct: 0.1 mg/dL (ref 0.0–0.3)
TOTAL PROTEIN: 6.5 g/dL (ref 6.0–8.3)

## 2014-05-02 LAB — IRON AND TIBC
%SAT: 40 % (ref 20–55)
Iron: 144 ug/dL (ref 42–145)
TIBC: 363 ug/dL (ref 250–470)
UIBC: 219 ug/dL (ref 125–400)

## 2014-05-02 LAB — MAGNESIUM: Magnesium: 2.1 mg/dL (ref 1.5–2.5)

## 2014-05-02 NOTE — Patient Instructions (Signed)

## 2014-05-02 NOTE — Progress Notes (Signed)
Patient ID: Akita Maxim, female   DOB: [birth year], 59 y.o.   MRN: [id number]  Annual Comprehensive Examination  This very nice 59 y.o. MWF presents for complete physical.  Patient has been followed for HTN,  Screened for Prediabetes, Hyperlipidemia, and Vitamin D Deficiency.    HTN predates since 2000. Patient's BP has been controlled at home and patient denies any cardiac symptoms as chest pain, palpitations, shortness of breath, dizziness or ankle swelling. Today's BP: 132/80 mmHg    Patient's hyperlipidemia is controlled with diet and medications. Patient denies myalgias or other medication SE's. Last lipids were at goal - Total Chol 145; HDL 55; LDL 54; with sl elevated Trig 178 on 10/09/2013.   Patient is screened for prediabetes predating and patient denies reactive hypoglycemic symptoms, visual blurring, diabetic polys, or paresthesias. Last A1c was 5.3% on 10/09/2013.   Finally, patient has history of Vitamin D Deficiency and last Vitamin D was  78 on 10/09/2013.  Medication Sig  . ALPRAZolam (XANAX) 1 MG tablet TAKE 1/2-1 TAB 2 to 3 times daily as needed for anxiety  . aspirin 81 MG tablet Take 81 mg by mouth daily.  Marland Kitchen atenolol (TENORMIN) 100 MG tablet Take 1 tablet (100 mg total) by mouth daily. For BP  . VITAMIN D 2000 UNITS tablet Take 2,000 Units by mouth 2 (two) times daily.  . hctz 25 MG tablet Take 1 tablet (25 mg total) by mouth daily.  . pravastatin  40 MG tablet Take 1 tablet (40 mg total) by mouth daily.  . sertraline  50 MG tablet Take 1 tablet (50 mg total) by mouth daily. For mood  . triamcinolone cream (KENALOG) 0.1 % Apply 1 application topically 2 (two) times daily.   Allergies  Allergen Reactions  . Lipitor [Atorvastatin]    Past Medical History  Diagnosis Date  . Hypertension   . Hyperlipidemia   . Vitamin D deficiency   . Climacteric   . Fibrocystic breast   . Allergy    Health Maintenance  Topic Date Due  . PAP SMEAR  12/27/1971  . TETANUS/TDAP   02/16/2012  . INFLUENZA VACCINE  09/15/2013  . ZOSTAVAX  12/26/2013  . COLONOSCOPY  02/15/2014  . MAMMOGRAM  04/10/2015  . HIV Screening  Completed   Immunization History  Administered Date(s) Administered  . DTaP 03/02/2012  . Influenza-Unspecified 11/15/2012  . PPD Test 03/07/2013, 05/02/2014  . Pneumococcal-Unspecified 02/15/2009  . Td 02/15/2002   Past Surgical History  Procedure Laterality Date  . Cesarean section  1975 and 1980  . Tubal ligation     Family History  Problem Relation Age of Onset  . Hypertension Mother   . Diabetes Mother   . Hypertension Father   . Heart disease Father   . Cancer Father   . Hypertension Brother   . Diabetes Other    History  Substance Use Topics  . Smoking status: Former Smoker    Quit date: 02/15/1986  . Smokeless tobacco: Not on file  . Alcohol Use: No    ROS Constitutional: Denies fever, chills, weight loss/gain, headaches, insomnia, fatigue, night sweats, and change in appetite. Eyes: Denies redness, blurred vision, diplopia, discharge, itchy, watery eyes.  ENT: Denies discharge, congestion, post nasal drip, epistaxis, sore throat, earache, hearing loss, dental pain, Tinnitus, Vertigo, Sinus pain, snoring.  Cardio: Denies chest pain, palpitations, irregular heartbeat, syncope, dyspnea, diaphoresis, orthopnea, PND, claudication, edema Respiratory: denies cough, dyspnea, DOE, pleurisy, hoarseness, laryngitis, wheezing.  Gastrointestinal: Denies dysphagia, heartburn, reflux, water  brash, pain, cramps, nausea, vomiting, bloating, diarrhea, constipation, hematemesis, melena, hematochezia, jaundice, hemorrhoids Genitourinary: Denies dysuria, frequency, urgency, nocturia, hesitancy, discharge, hematuria, flank pain Breast: Breast lumps, nipple discharge, bleeding.  Musculoskeletal: Denies arthralgia, myalgia, stiffness, Jt. Swelling, pain, limp, and strain/sprain. Denies falls. Skin: Denies puritis, rash, hives, warts, acne, eczema,  changing in skin lesion Neuro: No weakness, tremor, incoordination, spasms, paresthesia, pain Psychiatric: Denies confusion, memory loss, sensory loss. Denies Depression. Endocrine: Denies change in weight, skin, hair change, nocturia, and paresthesia, diabetic polys, visual blurring, hyper / hypo glycemic episodes.  Heme/Lymph: No excessive bleeding, bruising, enlarged lymph nodes.  Physical Exam  BP 132/80   Pulse 68  Temp 97.5 F   Resp 16  Ht 5' 6.5"   Wt 152 lb 9.6 oz     BMI 24.26   General Appearance: Well nourished and in no apparent distress. Eyes: PERRLA, EOMs, conjunctiva no swelling or erythema, normal fundi and vessels. Sinuses: No frontal/maxillary tenderness ENT/Mouth: EACs patent / TMs  nl. Nares clear without erythema, swelling, mucoid exudates. Oral hygiene is good. No erythema, swelling, or exudate. Tongue normal, non-obstructing. Tonsils not swollen or erythematous. Hearing normal.  Neck: Supple, thyroid normal. No bruits, nodes or JVD. Respiratory: Respiratory effort normal.  BS equal and clear bilateral without rales, rhonci, wheezing or stridor. Cardio: Heart sounds are normal with regular rate and rhythm and no murmurs, rubs or gallops. Peripheral pulses are normal and equal bilaterally without edema. No aortic or femoral bruits. Chest: symmetric with normal excursions and percussion. Breasts: Symmetric, without lumps, nipple discharge, retractions, or fibrocystic changes.  Abdomen: Flat, soft, with bowl sounds. Nontender, no guarding, rebound, hernias, masses, or organomegaly.  Lymphatics: Non tender without lymphadenopathy.  Genitourinary:  Musculoskeletal: Full ROM all peripheral extremities, joint stability, 5/5 strength, and normal gait. Skin: Warm and dry without rashes, lesions, cyanosis, clubbing or  ecchymosis.  Neuro: Cranial nerves intact, reflexes equal bilaterally. Normal muscle tone, no cerebellar symptoms. Sensation intact.  Pysch: Awake and  oriented X 3, normal affect, Insight and Judgment appropriate.   Assessment and Plan  1. Essential hypertension  - Microalbumin / creatinine urine ratio - EKG 12-Lead - [place], RETROPERITNL ABD,  LTD  2. Hyperlipidemia  - Lipid panel  3. Prediabetes  - Hemoglobin A1c - Insulin, random  4. Vitamin D deficiency  - Vit D  25 hydroxy (rtn osteoporosis monitoring)  5. Screening for rectal cancer  - POC Hemoccult Bld/Stl (3-Cd Home Screen); Future  6. Other fatigue  - Vitamin B12 - Iron and TIBC - TSH  7. Medication management  - Urine Microscopic - CBC with Differential/Platelet - BASIC METABOLIC PANEL WITH GFR - Hepatic function panel - Magnesium  8. Screening examination for pulmonary tuberculosis  - PPD   Continue prudent diet as discussed, weight control, BP monitoring, regular exercise, and medications. Discussed med's effects and SE's. Screening labs and tests as requested with regular follow-up as recommended.

## 2014-05-03 LAB — URINALYSIS, MICROSCOPIC ONLY
CASTS: NONE SEEN
Crystals: NONE SEEN
SQUAMOUS EPITHELIAL / LPF: NONE SEEN

## 2014-05-03 LAB — CBC WITH DIFFERENTIAL/PLATELET
BASOS ABS: 0 10*3/uL (ref 0.0–0.1)
Basophils Relative: 1 % (ref 0–1)
EOS PCT: 1 % (ref 0–5)
Eosinophils Absolute: 0 10*3/uL (ref 0.0–0.7)
HCT: 42.1 % (ref 36.0–46.0)
Hemoglobin: 13.9 g/dL (ref 12.0–15.0)
Lymphocytes Relative: 45 % (ref 12–46)
Lymphs Abs: 1.9 10*3/uL (ref 0.7–4.0)
MCH: 32.5 pg (ref 26.0–34.0)
MCHC: 33 g/dL (ref 30.0–36.0)
MCV: 98.4 fL (ref 78.0–100.0)
MONOS PCT: 8 % (ref 3–12)
MPV: 10.3 fL (ref 8.6–12.4)
Monocytes Absolute: 0.3 10*3/uL (ref 0.1–1.0)
NEUTROS ABS: 1.9 10*3/uL (ref 1.7–7.7)
Neutrophils Relative %: 45 % (ref 43–77)
Platelets: 219 10*3/uL (ref 150–400)
RBC: 4.28 MIL/uL (ref 3.87–5.11)
RDW: 13.1 % (ref 11.5–15.5)
WBC: 4.3 10*3/uL (ref 4.0–10.5)

## 2014-05-03 LAB — VITAMIN B12: Vitamin B-12: 479 pg/mL (ref 211–911)

## 2014-05-03 LAB — MICROALBUMIN / CREATININE URINE RATIO: Creatinine, Urine: 13.6 mg/dL

## 2014-05-03 LAB — VITAMIN D 25 HYDROXY (VIT D DEFICIENCY, FRACTURES): Vit D, 25-Hydroxy: 44 ng/mL (ref 30–100)

## 2014-05-03 LAB — TSH: TSH: 1.849 u[IU]/mL (ref 0.350–4.500)

## 2014-05-03 LAB — HEMOGLOBIN A1C
HEMOGLOBIN A1C: 5.1 % (ref <5.7)
Mean Plasma Glucose: 100 mg/dL (ref <117)

## 2014-05-03 LAB — INSULIN, RANDOM: Insulin: 8.9 u[IU]/mL (ref 2.0–19.6)

## 2014-05-21 ENCOUNTER — Other Ambulatory Visit: Payer: Self-pay | Admitting: Physician Assistant

## 2014-05-21 DIAGNOSIS — F411 Generalized anxiety disorder: Secondary | ICD-10-CM

## 2014-05-22 ENCOUNTER — Other Ambulatory Visit: Payer: Self-pay | Admitting: Internal Medicine

## 2014-06-02 ENCOUNTER — Other Ambulatory Visit: Payer: Self-pay | Admitting: Internal Medicine

## 2014-06-30 ENCOUNTER — Other Ambulatory Visit: Payer: Self-pay | Admitting: Emergency Medicine

## 2014-08-05 ENCOUNTER — Other Ambulatory Visit: Payer: Self-pay | Admitting: Internal Medicine

## 2014-08-07 ENCOUNTER — Other Ambulatory Visit: Payer: Self-pay | Admitting: *Deleted

## 2014-08-07 MED ORDER — PRAVASTATIN SODIUM 40 MG PO TABS: 40.0000 mg | ORAL_TABLET | Freq: Every day | ORAL | Status: DC

## 2014-08-07 MED ORDER — SERTRALINE HCL 50 MG PO TABS: ORAL_TABLET | ORAL | Status: DC

## 2014-08-07 MED ORDER — HYDROCHLOROTHIAZIDE 25 MG PO TABS: 25.0000 mg | ORAL_TABLET | Freq: Every day | ORAL | Status: DC

## 2014-08-12 ENCOUNTER — Other Ambulatory Visit: Payer: Self-pay | Admitting: *Deleted

## 2014-08-12 MED ORDER — SERTRALINE HCL 50 MG PO TABS: ORAL_TABLET | ORAL | Status: DC

## 2014-08-12 MED ORDER — HYDROCHLOROTHIAZIDE 25 MG PO TABS: 25.0000 mg | ORAL_TABLET | Freq: Every day | ORAL | Status: DC

## 2014-08-12 MED ORDER — PRAVASTATIN SODIUM 40 MG PO TABS: 40.0000 mg | ORAL_TABLET | Freq: Every day | ORAL | Status: DC

## 2014-08-28 ENCOUNTER — Ambulatory Visit: Payer: Self-pay | Admitting: Internal Medicine

## 2014-12-11 ENCOUNTER — Ambulatory Visit: Payer: Self-pay | Admitting: Internal Medicine

## 2014-12-18 ENCOUNTER — Emergency Department (HOSPITAL_COMMUNITY): Payer: 59

## 2014-12-18 ENCOUNTER — Ambulatory Visit (INDEPENDENT_AMBULATORY_CARE_PROVIDER_SITE_OTHER): Payer: 59 | Admitting: Internal Medicine

## 2014-12-18 ENCOUNTER — Emergency Department (HOSPITAL_COMMUNITY)
Admission: EM | Admit: 2014-12-18 | Discharge: 2014-12-18 | Disposition: A | Discharge status: Home / Self Care | Payer: 59 | Attending: Emergency Medicine | Admitting: Emergency Medicine

## 2014-12-18 ENCOUNTER — Encounter (HOSPITAL_COMMUNITY): Payer: Self-pay | Admitting: Emergency Medicine

## 2014-12-18 ENCOUNTER — Encounter: Payer: Self-pay | Admitting: Internal Medicine

## 2014-12-18 VITALS — BP 162/98 | HR 82 | Temp 98.2°F | Resp 18 | Ht 66.0 in

## 2014-12-18 DIAGNOSIS — R531 Weakness: Secondary | ICD-10-CM

## 2014-12-18 DIAGNOSIS — R110 Nausea: Secondary | ICD-10-CM | POA: Diagnosis present

## 2014-12-18 DIAGNOSIS — Z7982 Long term (current) use of aspirin: Secondary | ICD-10-CM | POA: Diagnosis not present

## 2014-12-18 DIAGNOSIS — F411 Generalized anxiety disorder: Secondary | ICD-10-CM

## 2014-12-18 DIAGNOSIS — G253 Myoclonus: Secondary | ICD-10-CM

## 2014-12-18 DIAGNOSIS — I10 Essential (primary) hypertension: Secondary | ICD-10-CM | POA: Insufficient documentation

## 2014-12-18 DIAGNOSIS — M6289 Other specified disorders of muscle: Secondary | ICD-10-CM

## 2014-12-18 DIAGNOSIS — Z9851 Tubal ligation status: Secondary | ICD-10-CM | POA: Diagnosis not present

## 2014-12-18 DIAGNOSIS — Z79899 Other long term (current) drug therapy: Secondary | ICD-10-CM | POA: Insufficient documentation

## 2014-12-18 DIAGNOSIS — E860 Dehydration: Secondary | ICD-10-CM | POA: Diagnosis not present

## 2014-12-18 DIAGNOSIS — Z87891 Personal history of nicotine dependence: Secondary | ICD-10-CM | POA: Diagnosis not present

## 2014-12-18 DIAGNOSIS — Z8742 Personal history of other diseases of the female genital tract: Secondary | ICD-10-CM | POA: Diagnosis not present

## 2014-12-18 DIAGNOSIS — E785 Hyperlipidemia, unspecified: Secondary | ICD-10-CM | POA: Diagnosis not present

## 2014-12-18 DIAGNOSIS — E559 Vitamin D deficiency, unspecified: Secondary | ICD-10-CM | POA: Insufficient documentation

## 2014-12-18 LAB — CBC WITH DIFFERENTIAL/PLATELET
BASOS PCT: 0 %
Basophils Absolute: 0 10*3/uL (ref 0.0–0.1)
EOS ABS: 0.1 10*3/uL (ref 0.0–0.7)
EOS PCT: 2 %
HCT: 40.9 % (ref 36.0–46.0)
HEMOGLOBIN: 14.6 g/dL (ref 12.0–15.0)
Lymphocytes Relative: 35 %
Lymphs Abs: 1.8 10*3/uL (ref 0.7–4.0)
MCH: 33.1 pg (ref 26.0–34.0)
MCHC: 35.7 g/dL (ref 30.0–36.0)
MCV: 92.7 fL (ref 78.0–100.0)
Monocytes Absolute: 0.4 10*3/uL (ref 0.1–1.0)
Monocytes Relative: 9 %
Neutro Abs: 2.8 10*3/uL (ref 1.7–7.7)
Neutrophils Relative %: 54 %
PLATELETS: 233 10*3/uL (ref 150–400)
RBC: 4.41 MIL/uL (ref 3.87–5.11)
RDW: 12.5 % (ref 11.5–15.5)
WBC: 5.1 10*3/uL (ref 4.0–10.5)

## 2014-12-18 LAB — URINALYSIS, ROUTINE W REFLEX MICROSCOPIC
BILIRUBIN URINE: NEGATIVE
GLUCOSE, UA: NEGATIVE mg/dL
Hgb urine dipstick: NEGATIVE
Ketones, ur: NEGATIVE mg/dL
NITRITE: NEGATIVE
PH: 6 (ref 5.0–8.0)
Protein, ur: NEGATIVE mg/dL
SPECIFIC GRAVITY, URINE: 1.027 (ref 1.005–1.030)
Urobilinogen, UA: 0.2 mg/dL (ref 0.0–1.0)

## 2014-12-18 LAB — COMPREHENSIVE METABOLIC PANEL
ALBUMIN: 4.2 g/dL (ref 3.5–5.0)
ALK PHOS: 80 U/L (ref 38–126)
ALT: 48 U/L (ref 14–54)
ANION GAP: 5 (ref 5–15)
AST: 37 U/L (ref 15–41)
BILIRUBIN TOTAL: 0.7 mg/dL (ref 0.3–1.2)
BUN: 11 mg/dL (ref 6–20)
CALCIUM: 9 mg/dL (ref 8.9–10.3)
CO2: 27 mmol/L (ref 22–32)
Chloride: 107 mmol/L (ref 101–111)
Creatinine, Ser: 0.54 mg/dL (ref 0.44–1.00)
GFR calc Af Amer: >60 mL/min (ref >60)
GLUCOSE: 83 mg/dL (ref 65–99)
Potassium: 4.2 mmol/L (ref 3.5–5.1)
Sodium: 139 mmol/L (ref 135–145)
Total Protein: 6.7 g/dL (ref 6.5–8.1)

## 2014-12-18 LAB — CK: Total CK: 100 U/L (ref 38–234)

## 2014-12-18 LAB — MAGNESIUM: Magnesium: 2.4 mg/dL (ref 1.7–2.4)

## 2014-12-18 LAB — TSH: TSH: 2.487 u[IU]/mL (ref 0.350–4.500)

## 2014-12-18 LAB — URINE MICROSCOPIC-ADD ON

## 2014-12-18 MED ORDER — SODIUM CHLORIDE 0.9 % IV BOLUS (SEPSIS)
1000.0000 mL | Freq: Once | INTRAVENOUS | Status: AC
  Administered 2014-12-18: 1000 mL via INTRAVENOUS

## 2014-12-18 MED ORDER — LORAZEPAM 2 MG/ML IJ SOLN
1.0000 mg | Freq: Once | INTRAMUSCULAR | Status: DC | PRN
  Administered 2014-12-18: 1 mg via INTRAVENOUS
  Filled 2014-12-18: qty 1

## 2014-12-18 MED ORDER — ALPRAZOLAM 1 MG PO TABS: 0.5000 mg | ORAL_TABLET | ORAL | Status: DC | PRN

## 2014-12-18 NOTE — ED Notes (Signed)
Pt to CT

## 2014-12-18 NOTE — ED Notes (Signed)
Off floor for testing 

## 2014-12-18 NOTE — ED Provider Notes (Signed)
Medical screening examination/treatment/procedure(s) were performed by non-physician practitioner and as supervising physician I was immediately available for consultation/collaboration.   EKG Interpretation None     58yF with generalized weakness and myoclonic jerking. Reportedly markedly hypokalemic on recent ED visit to outside facility (K of 1.5-2.0). Released early Monday morning after IV/PO potassium but has had continued symptoms.   On my exam she has intermittent myclonic jerking. Is more noticeable on R side, but also noted in LUE. Generalized weakness. Not focally worse in any extremity. Muscle tone seems normal. Bilateral patellar reflexes seem hyperreflexic. Biceps reflexes normal/symmetric. CN 2-12 intact.   Hypokalemia could certainly cause many of her symptoms. Does seem hyperreflexic in lower extremities which is not typical. This can be seen with hypomagnesemia though which often coincides hypokalemia. Magnesium level added.   I think significant metabolic/electrolyte abnormality is likely etiology of symptoms. Less likely acute neurological condition.   W/u actually pretty unremarkable. MRI findings nonspecific and can be seen in setting of many different things. Chronicity and clinical significance unclear. Discussed with neurology. Additional imaging ordered and w/o acute abnormality. At this point i feel can safely be discharged but needs neuro fu for persistent symptoms.   Virgel Manifold, MD 12/30/14 [id number]

## 2014-12-18 NOTE — Discharge Instructions (Signed)
Your imaging and your lab work are negative for any cause of your weakness and jerking YOu will need to follow up with your primary care doctor and a neurologist for further work up and an EMG as recommended by Dr Janann Colonel, the neurohospitalist.   Myoclonus Myoclonus is a term that refers to brief, involuntary twitching or jerking of a muscle or a group of muscles. It describes a symptom, and generally, is not a diagnosis of a disease. The myoclonic twitches or jerks are usually caused by sudden muscle contractions. They also can result from brief lapses of contraction. Myoclonic twitches or jerks may occur:  Alone or in sequence.  In a pattern or without pattern.  Infrequently or many times each minute. Often times, myoclonus is one of several symptoms in a wide variety of nervous system disorders such as:  Multiple sclerosis.  Parkinson's disease.  Alzheimer's disease.  Creutzfeldt-Jakob disease. Familiar examples of normal myoclonus include:  Hiccups and jerks.  "Sleep starts" that some people have while drifting off to sleep. Severe cases can severely limit a person's ability to:  Eat.  Talk.  Walk. Myoclonic jerks commonly occur in individuals with epilepsy. The most common types of myoclonus include:  Action.  Cortical reflex.  Essential.  Palatal.  Progressive myoclonus epilepsy.  Reticular reflex.  Sleep.  Stimulus-sensitive. TREATMENT  Treatment for myoclonus consists of medicines that may help reduce symptoms. These drugs (many of which are also used to treat epilepsy) include:   Barbiturates.  Clonazepam.  Phenytoin.  Primidone.  Sodium valproate. The complex origins of myoclonus may require the use of multiple drugs.   This information is not intended to replace advice given to you by your health care provider. Make sure you discuss any questions you have with your health care provider.   Document Released: 01/22/2002 Document Revised:  04/26/2011 Document Reviewed: 01/04/2013 Elsevier Interactive Patient Education 2016 [employer]  Weakness Weakness is a lack of strength. It may be felt all over the body (generalized) or in one specific part of the body (focal). Some causes of weakness can be serious. You may need further medical evaluation, especially if you are elderly or you have a history of immunosuppression (such as chemotherapy or HIV), kidney disease, heart disease, or diabetes. CAUSES  Weakness can be caused by many different things, including:  Infection.  Physical exhaustion.  Internal bleeding or other blood loss that results in a lack of red blood cells (anemia).  Dehydration. This cause is more common in elderly people.  Side effects or electrolyte abnormalities from medicines, such as pain medicines or sedatives.  Emotional distress, anxiety, or depression.  Circulation problems, especially severe peripheral arterial disease.  Heart disease, such as rapid atrial fibrillation, bradycardia, or heart failure.  Nervous system disorders, such as Guillain-Barr syndrome, multiple sclerosis, or stroke. DIAGNOSIS  To find the cause of your weakness, your caregiver will take your history and perform a physical exam. Lab tests or X-rays may also be ordered, if needed. TREATMENT  Treatment of weakness depends on the cause of your symptoms and can vary greatly. HOME CARE INSTRUCTIONS   Rest as needed.  Eat a well-balanced diet.  Try to get some exercise every day.  Only take over-the-counter or prescription medicines as directed by your caregiver. SEEK MEDICAL CARE IF:   Your weakness seems to be getting worse or spreads to other parts of your body.  You develop new aches or pains. SEEK IMMEDIATE MEDICAL CARE IF:   You cannot  perform your normal daily activities, such as getting dressed and feeding yourself.  You cannot walk up and down stairs, or you feel exhausted when you do so.  You have  shortness of breath or chest pain.  You have difficulty moving parts of your body.  You have weakness in only one area of the body or on only one side of the body.  You have a fever.  You have trouble speaking or swallowing.  You cannot control your bladder or bowel movements.  You have black or bloody vomit or stools. MAKE SURE YOU:  Understand these instructions.  Will watch your condition.  Will get help right away if you are not doing well or get worse.   This information is not intended to replace advice given to you by your health care provider. Make sure you discuss any questions you have with your health care provider.   Document Released: 02/01/2005 Document Revised: 08/03/2011 Document Reviewed: 04/02/2011 Elsevier Interactive Patient Education [employer].

## 2014-12-18 NOTE — Progress Notes (Signed)
Subjective:    Patient ID: Ruth Ward, female    DOB: [birth year], 59 y.o.   MRN: [id number]  HPI  Patient presents to the office for evaluation of post hospitalization in [facility] [place].  Patient reports that she had several days of not truly exerting herself and not eating much and then she reports that she started getting very nauseated and that she didn't feel very good.  She reports that she did have some vomiting since then.  She reports that she went to the hospital and they found that her potassium was very low between 1.5-2.  She was supposed to be admitted but she declined and was given supplemental potassium. She reports that her balance does not feel right.  She feels like her back, neck and her arms feel like they are aching.  She reports that she has been having intermittent headaches.  She has been taking 40 mg of potassium daily up until today.  She has also been eating bananas and fruit and eating that.  Sh reports that she has the sensation that she has been having electric shocks going down her body and she also reports that she has been mildly confused.  She was not eating because she was trying to lose some weight.    Review of Systems  Constitutional: Positive for chills and fatigue. Negative for fever and diaphoresis.  HENT: Positive for rhinorrhea. Negative for congestion, postnasal drip, sore throat and voice change.        Sound sensitivity  Eyes: Positive for photophobia and visual disturbance.  Respiratory: Negative for chest tightness and shortness of breath.   Cardiovascular: Negative for chest pain, palpitations and leg swelling.  Gastrointestinal: Positive for nausea. Negative for vomiting, abdominal pain, diarrhea, constipation and blood in stool.  Genitourinary: Negative for dysuria, urgency, frequency, hematuria and difficulty urinating.  Musculoskeletal: Positive for myalgias.  Neurological: Positive for dizziness, tremors, weakness, light-headedness and  numbness.  Psychiatric/Behavioral: Positive for confusion.       Objective:   Physical Exam  Constitutional: She is oriented to person, place, and time. She appears well-developed and well-nourished. No distress.  HENT:  Head: Normocephalic.  Mouth/Throat: Oropharynx is clear and moist. No oropharyngeal exudate.  Tongue trembling with protrusion  Eyes: Conjunctivae are normal. No scleral icterus.  Neck: Normal range of motion. Neck supple. No JVD present. No thyromegaly present.  Cardiovascular: Regular rhythm, normal heart sounds and intact distal pulses.  Tachycardia present.  Exam reveals no gallop and no friction rub.   No murmur heard. Pulmonary/Chest: Effort normal and breath sounds normal. No respiratory distress. She has no wheezes. She has no rales. She exhibits no tenderness.  Abdominal: Soft. Bowel sounds are normal. She exhibits no distension and no mass. There is no tenderness. There is no rebound and no guarding.  Lymphadenopathy:    She has no cervical adenopathy.  Neurological: She is alert and oriented to person, place, and time. No cranial nerve deficit or sensory deficit. Gait abnormal.  Myoclonic jerking on examination.  Mild right sided deficit of the right lower extremity and right upper extremity with strength testing.  Positive romberg and walking very stiffly  Skin: Skin is warm and dry. She is not diaphoretic.  Psychiatric: She has a normal mood and affect. Her behavior is normal. Judgment and thought content normal.  Nursing note and vitals reviewed.   Filed Vitals:   12/18/14 1400  BP: 162/98  Pulse: 82  Temp: 98.2 F (36.8 C)  Resp:  18         Assessment & Plan:   Likely secondary to electrolyte imbalance given history of poor intake and severe vomiting several days ago.  Given that we won't get labs back until tomorrow and some right sided weakness feel that she should go to the ER for possible fluids and stat labs.    1. Myoclonus  2. Right  sided weakness

## 2014-12-18 NOTE — ED Notes (Signed)
Pt states that she was at [facility] when she got sick and began N/V. She was seen and evaluated at the hospital there and given IV potassium for hypokalemia and was dx with rhabdomylasis. Sent here by Dr. Lawanna Kobus today. Alert and oriented.

## 2014-12-18 NOTE — ED Provider Notes (Signed)
CSN: [id number]     Arrival date & time 12/18/14  1504 History   First MD Initiated Contact with Patient 12/18/14 1512     Chief Complaint  Patient presents with  . Nausea  . Dehydration     (Consider location/radiation/quality/duration/timing/severity/associated sxs/prior Treatment) HPI  Ruth Ward who presents emergency Department for evaluation of weakness, myoclonic jerks, and dehydration. The patient was in [place] this past weekend. Saturday, she spent all day in an amusement park, Sunday, she went hiking. She felt fine until her ride home at which she became nauseated. She states she laid down and drink water. She was unable to feel any better. During the evening, she began having myoclonic twitching in her right arm and right leg. She was diagnosed with hyperkalemia and rhabdomyolysis. Patient does take hydrochlorothiazide and has been dieting by eating only one meal daily. She has not taken her medications since Sunday, 3 days ago. Patient's potassium was noted to be between 1.5 and 2. He was repleted. During her visit and above 3 by the time she was discharged. She is given fluid rehydration. She declined admission but was told at discharge that she must follow closely with her primary care physician. She was seen today at her PCPs office who sent her in for further evaluation. Patient states that since her initial workup. She has continued to have generalized weakness, however, it is worse on the right that is where her myoclonic jerking occurs. She has still had some nausea, which has worsened today on her ride over. Denies  facial asymmetry, difficulty with speech, change in gait, or vertigo. She does not that her "vision is bad" since the onset of symptoms. Denies fevers, chills, myalgias, arthralgias. Denies DOE, SOB, chest tightness or pressure, radiation to left arm, jaw or back, or diaphoresis. Denies dysuria, flank pain, suprapubic pain, frequency, urgency, or hematuria.  Denies headaches, or paresthesias. Denies abdominal pain, nausea, vomiting, diarrhea or constipation.    Past Medical History  Diagnosis Date  . Hypertension   . Hyperlipidemia   . Vitamin D deficiency   . Climacteric   . Fibrocystic breast   . Allergy    Past Surgical History  Procedure Laterality Date  . Cesarean section  1975 and 1980  . Tubal ligation     Family History  Problem Relation Age of Onset  . Hypertension Mother   . Diabetes Mother   . Hypertension Father   . Heart disease Father   . Cancer Father   . Hypertension Brother   . Diabetes Other    Social History  Substance Use Topics  . Smoking status: Former Smoker    Quit date: 02/15/1986  . Smokeless tobacco: None  . Alcohol Use: No   OB History    No data available     Review of Systems  Ten systems reviewed and are negative for acute change, except as noted in the HPI.    Allergies  Lipitor  Home Medications   Prior to Admission medications   Medication Sig Start Date End Date Taking? Authorizing Provider  ALPRAZolam Duanne Moron) 1 MG tablet Take 1/2 to 1 tablet by mouth two to three times daily as needed. Patient not taking: Reported on 12/18/2014 05/21/14   Unk Pinto, MD  aspirin 81 MG tablet Take 81 mg by mouth daily.    Historical Provider, MD  atenolol (TENORMIN) 100 MG tablet Take 1 tablet (100 mg total) by mouth daily. For BP Patient not taking: Reported on 12/18/2014 10/09/13  Unk Pinto, MD  Cholecalciferol (VITAMIN D) 2000 UNITS tablet Take 2,000 Units by mouth 2 (two) times daily.    Historical Provider, MD  hydrochlorothiazide (HYDRODIURIL) 25 MG tablet Take 1 tablet (25 mg total) by mouth daily. Patient not taking: Reported on 12/18/2014 08/12/14 08/13/15  Unk Pinto, MD  ondansetron (ZOFRAN) 4 MG tablet Take 4 mg by mouth every 8 (eight) hours as needed for nausea or vomiting.    Historical Provider, MD  Potassium Chloride Crys CR (KLOR-CON M20 PO) Take 1 tablet by mouth  daily.    Historical Provider, MD  pravastatin (PRAVACHOL) 40 MG tablet Take 1 tablet (40 mg total) by mouth daily. Patient not taking: Reported on 12/18/2014 08/12/14   Unk Pinto, MD  promethazine (PHENERGAN) 25 MG tablet Take 25 mg by mouth every 6 (six) hours as needed for nausea or vomiting.    Historical Provider, MD  sertraline (ZOLOFT) 50 MG tablet TAKE 1 TABLET BY MOUTH DAILY FOR MOOD. Patient not taking: Reported on 12/18/2014 08/12/14   Unk Pinto, MD   BP 161/76 mmHg  Pulse 87  Temp(Src) 98.1 F (36.7 C) (Oral)  Resp 18  SpO2 97% Physical Exam  Constitutional: She is oriented to person, place, and time. She appears well-developed and well-nourished. No distress.  HENT:  Head: Normocephalic and atraumatic.  Mouth/Throat: Oropharynx is clear and moist.  Eyes: Conjunctivae and EOM are normal. Pupils are equal, round, and reactive to light. No scleral icterus.  No horizontal, vertical or rotational nystagmus  Neck: Normal range of motion. Neck supple.  Full active and passive ROM without pain No midline or paraspinal tenderness No nuchal rigidity or meningeal signs  Cardiovascular: Normal rate, regular rhythm and intact distal pulses.   Pulmonary/Chest: Effort normal and breath sounds normal. No respiratory distress. She has no wheezes. She has no rales.  Abdominal: Soft. Bowel sounds are normal. There is no tenderness. There is no rebound and no guarding.  Musculoskeletal: Normal range of motion.  Lymphadenopathy:    She has no cervical adenopathy.  Neurological: She is alert and oriented to person, place, and time. She displays abnormal reflex. No cranial nerve deficit. She exhibits normal muscle tone. Coordination abnormal.  Mental Status:  Alert, oriented, thought content appropriate. Speech fluent without evidence of aphasia. Able to follow 2 step commands without difficulty.  Cranial Nerves:  II:  Peripheral visual fields grossly normal, pupils equal, round,  reactive to light III,IV, VI: ptosis not present, extra-ocular motions intact bilaterally  V,VII: smile symmetric, facial light touch sensation equal VIII: hearing grossly normal bilaterally  IX,X: midline uvula rise  XI: bilateral shoulder shrug equal and strong XII: midline tongue extension  Motor:  5/5 in upper and lower Left extremities bilaterally including grip strength and dorsiflexion/plantar flexion 4/5 strength in the R upper and lower extremities. Sensory: Pinprick and light touch normal in all extremities.  Deep Tendon Reflexes: marked difference in Patellar reflexes with Hyperreflexia in the R patellar tendo  Cerebellar: Poorly coordinated F-N bilaterally Gait: shuffling gait. Poor balance CV: distal pulses palpable throughout   Skin: Skin is warm and dry. No rash noted. She is not diaphoretic.  Psychiatric: She has a normal mood and affect. Her behavior is normal. Judgment and thought content normal.  Nursing note and vitals reviewed.   ED Course  Procedures (including critical care time) Labs Review Labs Reviewed  CBC WITH DIFFERENTIAL/PLATELET  URINALYSIS, ROUTINE W REFLEX MICROSCOPIC (NOT AT [facility])  COMPREHENSIVE METABOLIC PANEL  CK  Imaging Review No results found. I have personally reviewed and evaluated these images and lab results as part of my medical decision-making.   EKG Interpretation   Date/Time:  Wednesday December 18 2014 16:23:14 EDT Ventricular Rate:  66 PR Interval:  149 QRS Duration: 74 QT Interval:  393 QTC Calculation: 412 R Axis:   47 Text Interpretation:  Sinus rhythm No old tracing to compare Confirmed by  [place]  MD, STEPHEN ([id number]) on 12/18/2014 4:36:06 PM      MDM   Final diagnoses:  Myoclonic jerking    3:38 PM BP 161/76 mmHg  Pulse 87  Temp(Src) 98.1 F (36.7 C) (Oral)  Resp 18  SpO2 97% Discussed patient xase with Dr. Wilson Singer. Agree to obtain lab resutls. I have aske d the Dr. Loreta Ave repeat a neuro exam. Will  reevaluate. ?Need for MRI.  4:44 PM BP 161/76 mmHg  Pulse 87  Temp(Src) 98.1 F (36.7 C) (Oral)  Resp 18  SpO2 97% Patiet EKG does not appear to have any acute ischemia or arrhythmias. Electrolytes and blood counts are unremarkable. awaitig mag level.  5:35 PM Updated the patient on her results and plan to proceed with MRI. Patient agrees with poc    I discussed the MRI findings with  Dr. Janann Colonel. He asks for MRI Cspine and TSH. Disucssed with patient who agrees to proceed with work up.   Patient imaging and TSH without abnormality. Dr. Janann Colonel recommends follow up OP EMG. Discussed all findings with the patient and answered questions to the best of my ability. The patient appears reasonably screened and/or stabilized for discharge and I doubt any other medical condition or other [facility] requiring further screening, evaluation, or treatment in the ED at this time prior to discharge.   Margarita Mail, PA-C 12/20/14 [place], MD 12/20/14 [phone]

## 2014-12-18 NOTE — ED Notes (Signed)
Patient transported to MRI 

## 2014-12-18 NOTE — ED Notes (Signed)
PA at bedside.

## 2014-12-30 ENCOUNTER — Encounter: Payer: Self-pay | Admitting: Internal Medicine

## 2015-01-03 ENCOUNTER — Other Ambulatory Visit: Payer: Self-pay | Admitting: *Deleted

## 2015-01-03 MED ORDER — ALPRAZOLAM 1 MG PO TABS: ORAL_TABLET | ORAL | Status: DC

## 2015-01-23 ENCOUNTER — Other Ambulatory Visit: Payer: Self-pay | Admitting: *Deleted

## 2015-01-23 MED ORDER — ALPRAZOLAM 1 MG PO TABS: ORAL_TABLET | ORAL | Status: DC

## 2015-01-23 MED ORDER — POTASSIUM CHLORIDE ER 20 MEQ PO TBCR: 1.0000 | EXTENDED_RELEASE_TABLET | Freq: Every day | ORAL | Status: DC

## 2015-05-14 ENCOUNTER — Other Ambulatory Visit: Payer: Self-pay | Admitting: *Deleted

## 2015-05-14 DIAGNOSIS — I10 Essential (primary) hypertension: Secondary | ICD-10-CM

## 2015-05-14 MED ORDER — ATENOLOL 100 MG PO TABS: 100.0000 mg | ORAL_TABLET | Freq: Every day | ORAL | Status: DC

## 2015-05-22 ENCOUNTER — Other Ambulatory Visit: Payer: Self-pay | Admitting: Internal Medicine

## 2015-05-22 ENCOUNTER — Ambulatory Visit (INDEPENDENT_AMBULATORY_CARE_PROVIDER_SITE_OTHER): Payer: 59 | Admitting: Internal Medicine

## 2015-05-22 ENCOUNTER — Encounter: Payer: Self-pay | Admitting: Internal Medicine

## 2015-05-22 VITALS — BP 126/78 | HR 60 | Temp 97.6°F | Resp 16 | Ht 66.5 in | Wt 178.4 lb

## 2015-05-22 DIAGNOSIS — Z0000 Encounter for general adult medical examination without abnormal findings: Secondary | ICD-10-CM

## 2015-05-22 DIAGNOSIS — Z136 Encounter for screening for cardiovascular disorders: Secondary | ICD-10-CM | POA: Diagnosis not present

## 2015-05-22 DIAGNOSIS — Z111 Encounter for screening for respiratory tuberculosis: Secondary | ICD-10-CM | POA: Diagnosis not present

## 2015-05-22 DIAGNOSIS — R7303 Prediabetes: Secondary | ICD-10-CM

## 2015-05-22 DIAGNOSIS — Z1212 Encounter for screening for malignant neoplasm of rectum: Secondary | ICD-10-CM

## 2015-05-22 DIAGNOSIS — E559 Vitamin D deficiency, unspecified: Secondary | ICD-10-CM

## 2015-05-22 DIAGNOSIS — Z0001 Encounter for general adult medical examination with abnormal findings: Secondary | ICD-10-CM

## 2015-05-22 DIAGNOSIS — Z79899 Other long term (current) drug therapy: Secondary | ICD-10-CM

## 2015-05-22 DIAGNOSIS — I10 Essential (primary) hypertension: Secondary | ICD-10-CM

## 2015-05-22 DIAGNOSIS — R5383 Other fatigue: Secondary | ICD-10-CM

## 2015-05-22 DIAGNOSIS — E785 Hyperlipidemia, unspecified: Secondary | ICD-10-CM

## 2015-05-22 LAB — LIPID PANEL
CHOLESTEROL: 136 mg/dL (ref 125–200)
HDL: 51 mg/dL (ref >=46)
LDL Cholesterol: 50 mg/dL (ref <130)
TRIGLYCERIDES: 173 mg/dL — AB (ref <150)
Total CHOL/HDL Ratio: 2.7 Ratio (ref <=5.0)
VLDL: 35 mg/dL — ABNORMAL HIGH (ref <30)

## 2015-05-22 LAB — CBC WITH DIFFERENTIAL/PLATELET
BASOS ABS: 54 {cells}/uL (ref 0–200)
Basophils Relative: 1 %
EOS ABS: 54 {cells}/uL (ref 15–500)
EOS PCT: 1 %
HCT: 42.7 % (ref 35.0–45.0)
Hemoglobin: 14.8 g/dL (ref 11.7–15.5)
LYMPHS PCT: 42 %
Lymphs Abs: 2268 cells/uL (ref 850–3900)
MCH: 32.4 pg (ref 27.0–33.0)
MCHC: 34.7 g/dL (ref 32.0–36.0)
MCV: 93.4 fL (ref 80.0–100.0)
MONOS PCT: 7 %
MPV: 10.7 fL (ref 7.5–12.5)
Monocytes Absolute: 378 cells/uL (ref 200–950)
Neutro Abs: 2646 cells/uL (ref 1500–7800)
Neutrophils Relative %: 49 %
PLATELETS: 221 10*3/uL (ref 140–400)
RBC: 4.57 MIL/uL (ref 3.80–5.10)
RDW: 13.3 % (ref 11.0–15.0)
WBC: 5.4 10*3/uL (ref 3.8–10.8)

## 2015-05-22 LAB — IRON AND TIBC
%SAT: 41 % (ref 11–50)
Iron: 144 ug/dL (ref 45–160)
TIBC: 354 ug/dL (ref 250–450)
UIBC: 210 ug/dL (ref 125–400)

## 2015-05-22 LAB — BASIC METABOLIC PANEL WITHOUT GFR
BUN: 15 mg/dL (ref 7–25)
CO2: 26 mmol/L (ref 20–31)
Calcium: 9.2 mg/dL (ref 8.6–10.4)
Chloride: 102 mmol/L (ref 98–110)
Creat: 0.62 mg/dL (ref 0.50–0.99)
GFR, Est African American: >89 mL/min (ref >=60)
GFR, Est Non African American: >89 mL/min (ref >=60)
Glucose, Bld: 77 mg/dL (ref 65–99)
Potassium: 4 mmol/L (ref 3.5–5.3)
Sodium: 140 mmol/L (ref 135–146)

## 2015-05-22 LAB — HEPATIC FUNCTION PANEL
ALT: 34 U/L — ABNORMAL HIGH (ref 6–29)
AST: 29 U/L (ref 10–35)
Albumin: 4.2 g/dL (ref 3.6–5.1)
Alkaline Phosphatase: 82 U/L (ref 33–130)
Bilirubin, Direct: 0.1 mg/dL (ref <=0.2)
Indirect Bilirubin: 0.4 mg/dL (ref 0.2–1.2)
Total Bilirubin: 0.5 mg/dL (ref 0.2–1.2)
Total Protein: 6.6 g/dL (ref 6.1–8.1)

## 2015-05-22 LAB — TSH: TSH: 1.77 m[IU]/L

## 2015-05-22 LAB — MAGNESIUM: Magnesium: 2 mg/dL (ref 1.5–2.5)

## 2015-05-22 LAB — VITAMIN B12: Vitamin B-12: 481 pg/mL (ref 200–1100)

## 2015-05-22 NOTE — Patient Instructions (Signed)
Recommend Adult Low Dose Aspirin or   coated  Aspirin 81 mg daily   To reduce risk of Colon Cancer 20 %,   Skin Cancer 26 % ,   Melanoma 46%   and   Pancreatic cancer 60%   ++++++++++++++++++++++++++++++++++++++++++++++++++++++ Vitamin D goal   is between 70-100.   Please make sure that you are taking your Vitamin D as directed.   It is very important as a natural anti-inflammatory   helping hair, skin, and nails, as well as reducing stroke and heart attack risk.   It helps your bones and helps with mood.  It also decreases numerous cancer risks so please take it as directed.   Low Vit D is associated with a 200-300% higher risk for CANCER   and 200-300% higher risk for HEART   ATTACK  &  STROKE.   .....................................Marland Kitchen  It is also associated with higher death rate at younger ages,   autoimmune diseases like Rheumatoid arthritis, Lupus, Multiple Sclerosis.     Also many other serious conditions, like depression, Alzheimer's  Dementia, infertility, muscle aches, fatigue, fibromyalgia - just to name a few.  ++++++++++++++++++++++++++++++++++++++++++++++++  Recommend the book "The END of DIETING" by Dr Excell Seltzer   & the book "The END of DIABETES " by Dr Excell Seltzer  At [email or web address] - get book & Audio CD's     Being diabetic has a  300% increased risk for heart attack, stroke, cancer, and alzheimer- type vascular dementia. It is very important that you work harder with diet by avoiding all foods that are white. Avoid white rice (brown & wild rice is OK), white potatoes (sweetpotatoes in moderation is OK), White bread or wheat bread or anything made out of white flour like bagels, donuts, rolls, buns, biscuits, cakes, pastries, cookies, pizza crust, and pasta (made from white flour & egg whites) - vegetarian pasta or spinach or wheat pasta is OK. Multigrain breads like Arnold's or [employer], or multigrain sandwich thins or flatbreads.  Diet,  exercise and weight loss can reverse and cure diabetes in the early stages.  Diet, exercise and weight loss is very important in the control and prevention of complications of diabetes which affects every system in your body, ie. Brain - dementia/stroke, eyes - glaucoma/blindness, heart - heart attack/heart failure, kidneys - dialysis, stomach - gastric paralysis, intestines - malabsorption, nerves - severe painful neuritis, circulation - gangrene & loss of a leg(s), and finally cancer and Alzheimers.    I recommend avoid fried & greasy foods,  sweets/candy, white rice (brown or wild rice or Quinoa is OK), white potatoes (sweet potatoes are OK) - anything made from white flour - bagels, doughnuts, rolls, buns, biscuits,white and wheat breads, pizza crust and traditional pasta made of white flour & egg white(vegetarian pasta or spinach or wheat pasta is OK).  Multi-grain bread is OK - like multi-grain flat bread or sandwich thins. Avoid alcohol in excess. Exercise is also important.    Eat all the vegetables you want - avoid meat, especially red meat and dairy - especially cheese.  Cheese is the most concentrated form of trans-fats which is the worst thing to clog up our arteries. Veggie cheese is OK which can be found in the fresh produce section at [employer] or [employer] or [employer]  ++++++++++++++++++++++++++++++++++++++++++++++++++ DASH Eating Plan  DASH stands for "Dietary Approaches to Stop Hypertension."   The DASH eating plan is a healthy eating plan that has been shown to reduce high blood  pressure (hypertension). Additional health benefits may include reducing the risk of type 2 diabetes mellitus, heart disease, and stroke. The DASH eating plan may also help with weight loss.  WHAT DO I NEED TO KNOW ABOUT THE DASH EATING PLAN?  For the DASH eating plan, you will follow these general guidelines:  Choose foods with a percent daily value for sodium of less than 5% (as listed on the food  label).  Use salt-free seasonings or herbs instead of table salt or sea salt.  Check with your health care provider or pharmacist before using salt substitutes.  Eat lower-sodium products, often labeled as "lower sodium" or "no salt added."  Eat fresh foods.  Eat more vegetables, fruits, and low-fat dairy products.    Choose whole grains. Look for the word "whole" as the first word in the ingredient list.  Choose fish   Limit sweets, desserts, sugars, and sugary drinks.  Choose heart-healthy fats.  Eat veggie cheese   Eat more home-cooked food and less restaurant, buffet, and fast food.  Limit fried foods.  [place]uffaker foods using methods other than frying.  Limit canned vegetables. If you do use them, rinse them well to decrease the sodium.  When eating at a restaurant, ask that your food be prepared with less salt, or no salt if possible.                      WHAT FOODS CAN I EAT?  Read Dr Fara Olden Fuhrman's books on The End of Dieting & The End of Diabetes  Grains  Whole grain or whole wheat bread. Brown rice. Whole grain or whole wheat pasta. Quinoa, bulgur, and whole grain cereals. Low-sodium cereals. Corn or whole wheat flour tortillas. Whole grain cornbread. Whole grain crackers. Low-sodium crackers.  Vegetables  Fresh or frozen vegetables (raw, steamed, roasted, or grilled). Low-sodium or reduced-sodium tomato and vegetable juices. Low-sodium or reduced-sodium tomato sauce and paste. Low-sodium or reduced-sodium canned vegetables.   Fruits  All fresh, canned (in natural juice), or frozen fruits.  Protein Products   All fish and seafood.  Dried beans, peas, or lentils. Unsalted nuts and seeds. Unsalted canned beans.  Dairy  Low-fat dairy products, such as skim or 1% milk, 2% or reduced-fat cheeses, low-fat ricotta or cottage cheese, or plain low-fat yogurt. Low-sodium or reduced-sodium cheeses.  Fats and Oils  Tub margarines without trans fats. Light or  reduced-fat mayonnaise and salad dressings (reduced sodium). Avocado. Safflower, olive, or canola oils. Natural peanut or almond butter.  Other  Unsalted popcorn and pretzels. The items listed above may not be a complete list of recommended foods or beverages. Contact your dietitian for more options.  +++++++++++++++++++++++++++++++++++++++++++  WHAT FOODS ARE NOT RECOMMENDED?  Grains/ White flour or wheat flour  White bread. White pasta. White rice. Refined cornbread. Bagels and croissants. Crackers that contain trans fat.  Vegetables  Creamed or fried vegetables. Vegetables in a . Regular canned vegetables. Regular canned tomato sauce and paste. Regular tomato and vegetable juices.  Fruits  Dried fruits. Canned fruit in light or heavy syrup. Fruit juice.  Meat and Other Protein Products  Meat in general - RED mwaet & White meat.  Fatty cuts of meat. Ribs, chicken wings, bacon, sausage, bologna, salami, chitterlings, fatback, hot dogs, bratwurst, and packaged luncheon meats.  Dairy  Whole or 2% milk, cream, half-and-half, and cream cheese. Whole-fat or sweetened yogurt. Full-fat cheeses or blue cheese. Nondairy creamers and whipped toppings. Processed cheese, cheese spreads, or  cheese curds.  Condiments  Onion and garlic salt, seasoned salt, table salt, and sea salt. Canned and packaged gravies. Worcestershire sauce. Tartar sauce. Barbecue sauce. Teriyaki sauce. Soy sauce, including reduced sodium. Steak sauce. Fish sauce. Oyster sauce. Cocktail sauce. Horseradish. Ketchup and mustard. Meat flavorings and tenderizers. Bouillon cubes. Hot sauce. Tabasco sauce. Marinades. Taco seasonings. Relishes.  Fats and Oils Butter, stick margarine, lard, shortening and bacon fat. Coconut, palm kernel, or palm oils. Regular salad dressings.  Pickles and olives. Salted popcorn and pretzels.  The items listed above may not be a complete list of foods and beverages to avoid.   Preventive  Care for Adults  A healthy lifestyle and preventive care can promote health and wellness. Preventive health guidelines for women include the following key practices.  A routine yearly physical is a good way to check with your health care provider about your health and preventive screening. It is a chance to share any concerns and updates on your health and to receive a thorough exam.  Visit your dentist for a routine exam and preventive care every 6 months. Brush your teeth twice a day and floss once a day. Good oral hygiene prevents tooth decay and gum disease.  The frequency of eye exams is based on your age, health, family medical history, use of contact lenses, and other factors. Follow your health care provider's recommendations for frequency of eye exams.  Eat a healthy diet. Foods like vegetables, fruits, whole grains, low-fat dairy products, and lean protein foods contain the nutrients you need without too many calories. Decrease your intake of foods high in solid fats, added sugars, and salt. Eat the right amount of calories for you.Get information about a proper diet from your health care provider, if necessary.  Regular physical exercise is one of the most important things you can do for your health. Most adults should get at least 150 minutes of moderate-intensity exercise (any activity that increases your heart rate and causes you to sweat) each week. In addition, most adults need muscle-strengthening exercises on 2 or more days a week.  Maintain a healthy weight. The body mass index (BMI) is a screening tool to identify possible weight problems. It provides an estimate of body fat based on height and weight. Your health care provider can find your BMI and can help you achieve or maintain a healthy weight.For adults 20 years and older:  A BMI below 18.5 is considered underweight.  A BMI of 18.5 to 24.9 is normal.  A BMI of 25 to 29.9 is considered overweight.  A BMI of 30 and  above is considered obese.  Maintain normal blood lipids and cholesterol levels by exercising and minimizing your intake of saturated fat. Eat a balanced diet with plenty of fruit and vegetables. Blood tests for lipids and cholesterol should begin at age 21 and be repeated every 5 years. If your lipid or cholesterol levels are high, you are over 50, or you are at high risk for heart disease, you may need your cholesterol levels checked more frequently.Ongoing high lipid and cholesterol levels should be treated with medicines if diet and exercise are not working.  If you smoke, find out from your health care provider how to quit. If you do not use tobacco, do not start.  Lung cancer screening is recommended for adults aged 31-80 years who are at high risk for developing lung cancer because of a history of smoking. A yearly low-dose CT scan of the lungs is recommended  for people who have at least a 30-pack-year history of smoking and are a current smoker or have quit within the past 15 years. A pack year of smoking is smoking an average of 1 pack of cigarettes a day for 1 year (for example: 1 pack a day for 30 years or 2 packs a day for 15 years). Yearly screening should continue until the smoker has stopped smoking for at least 15 years. Yearly screening should be stopped for people who develop a health problem that would prevent them from having lung cancer treatment.  High blood pressure causes heart disease and increases the risk of stroke. Your blood pressure should be checked at least every 1 to 2 years. Ongoing high blood pressure should be treated with medicines if weight loss and exercise do not work.  If you are 30-80 years old, ask your health care provider if you should take aspirin to prevent strokes.  Diabetes screening involves taking a blood sample to check your fasting blood sugar level. This should be done once every 3 years, after age 61, if you are within normal weight and without risk  factors for diabetes. Testing should be considered at a younger age or be carried out more frequently if you are overweight and have at least 1 risk factor for diabetes.  Breast cancer screening is essential preventive care for women. You should practice "breast self-awareness." This means understanding the normal appearance and feel of your breasts and may include breast self-examination. Any changes detected, no matter how small, should be reported to a health care provider. Women in their 77s and 30s should have a clinical breast exam (CBE) by a health care provider as part of a regular health exam every 1 to 3 years. After age 81, women should have a CBE every year. Starting at age 20, women should consider having a mammogram (breast X-ray test) every year. Women who have a family history of breast cancer should talk to their health care provider about genetic screening. Women at a high risk of breast cancer should talk to their health care providers about having an MRI and a mammogram every year.  Breast cancer gene (BRCA)-related cancer risk assessment is recommended for women who have family members with BRCA-related cancers. BRCA-related cancers include breast, ovarian, tubal, and peritoneal cancers. Having family members with these cancers may be associated with an increased risk for harmful changes (mutations) in the breast cancer genes BRCA1 and BRCA2. Results of the assessment will determine the need for genetic counseling and BRCA1 and BRCA2 testing.  Routine pelvic exams to screen for cancer are no longer recommended for nonpregnant women who are considered low risk for cancer of the pelvic organs (ovaries, uterus, and vagina) and who do not have symptoms. Ask your health care provider if a screening pelvic exam is right for you.  If you have had past treatment for cervical cancer or a condition that could lead to cancer, you need Pap tests and screening for cancer for at least 20 years after  your treatment. If Pap tests have been discontinued, your risk factors (such as having a new sexual partner) need to be reassessed to determine if screening should be resumed. Some women have medical problems that increase the chance of getting cervical cancer. In these cases, your health care provider may recommend more frequent screening and Pap tests.  Colorectal cancer can be detected and often prevented. Most routine colorectal cancer screening begins at the age of 22 years and  continues through age 74 years. However, your health care provider may recommend screening at an earlier age if you have risk factors for colon cancer. On a yearly basis, your health care provider may provide home test kits to check for hidden blood in the stool. Use of a small camera at the end of a tube, to directly examine the colon (sigmoidoscopy or colonoscopy), can detect the earliest forms of colorectal cancer. Talk to your health care provider about this at age 22, when routine screening begins. Direct exam of the colon should be repeated every 5-10 years through age 76 years, unless early forms of pre-cancerous polyps or small growths are found.  Hepatitis C blood testing is recommended for all people born from [birth year] through 1967 and any individual with known risks for hepatitis C.  Pra  Osteoporosis is a disease in which the bones lose minerals and strength with aging. This can result in serious bone fractures or breaks. The risk of osteoporosis can be identified using a bone density scan. Women ages 88 years and over and women at risk for fractures or osteoporosis should discuss screening with their health care providers. Ask your health care provider whether you should take a calcium supplement or vitamin D to reduce the rate of osteoporosis.  Menopause can be associated with physical symptoms and risks. Hormone replacement therapy is available to decrease symptoms and risks. You should talk to your health care  provider about whether hormone replacement therapy is right for you.  Use sunscreen. Apply sunscreen liberally and repeatedly throughout the day. You should seek shade when your shadow is shorter than you. Protect yourself by wearing long sleeves, pants, a wide-brimmed hat, and sunglasses year round, whenever you are outdoors.  Once a month, do a whole body skin exam, using a mirror to look at the skin on your back. Tell your health care provider of new moles, moles that have irregular borders, moles that are larger than a pencil eraser, or moles that have changed in shape or color.  Stay current with required vaccines (immunizations).  Influenza vaccine. All adults should be immunized every year.  Tetanus, diphtheria, and acellular pertussis (Td, Tdap) vaccine. Pregnant women should receive 1 dose of Tdap vaccine during each pregnancy. The dose should be obtained regardless of the length of time since the last dose. Immunization is preferred during the 27th-36th week of gestation. An adult who has not previously received Tdap or who does not know her vaccine status should receive 1 dose of Tdap. This initial dose should be followed by tetanus and diphtheria toxoids (Td) booster doses every 10 years. Adults with an unknown or incomplete history of completing a 3-dose immunization series with Td-containing vaccines should begin or complete a primary immunization series including a Tdap dose. Adults should receive a Td booster every 10 years.  Varicella vaccine. An adult without evidence of immunity to varicella should receive 2 doses or a second dose if she has previously received 1 dose. Pregnant females who do not have evidence of immunity should receive the first dose after pregnancy. This first dose should be obtained before leaving the health care facility. The second dose should be obtained 4-8 weeks after the first dose.  Human papillomavirus (HPV) vaccine. Females aged 13-26 years who have not  received the vaccine previously should obtain the 3-dose series. The vaccine is not recommended for use in pregnant females. However, pregnancy testing is not needed before receiving a dose. If a female is found to  be pregnant after receiving a dose, no treatment is needed. In that case, the remaining doses should be delayed until after the pregnancy. Immunization is recommended for any person with an immunocompromised condition through the age of 14 years if she did not get any or all doses earlier. During the 3-dose series, the second dose should be obtained 4-8 weeks after the first dose. The third dose should be obtained 24 weeks after the first dose and 16 weeks after the second dose.  Zoster vaccine. One dose is recommended for adults aged 87 years or older unless certain conditions are present.  Measles, mumps, and rubella (MMR) vaccine. Adults born before [birth year] generally are considered immune to measles and mumps. Adults born in [birth year] or later should have 1 or more doses of MMR vaccine unless there is a contraindication to the vaccine or there is laboratory evidence of immunity to each of the three diseases. A routine second dose of MMR vaccine should be obtained at least 28 days after the first dose for students attending postsecondary schools, health care workers, or international travelers. People who received inactivated measles vaccine or an unknown type of measles vaccine during 1963-1967 should receive 2 doses of MMR vaccine. People who received inactivated mumps vaccine or an unknown type of mumps vaccine before 1979 and are at high risk for mumps infection should consider immunization with 2 doses of MMR vaccine. For females of childbearing age, rubella immunity should be determined. If there is no evidence of immunity, females who are not pregnant should be vaccinated. If there is no evidence of immunity, females who are pregnant should delay immunization until after pregnancy. Unvaccinated  health care workers born before [birth year] who lack laboratory evidence of measles, mumps, or rubella immunity or laboratory confirmation of disease should consider measles and mumps immunization with 2 doses of MMR vaccine or rubella immunization with 1 dose of MMR vaccine.  Pneumococcal 13-valent conjugate (PCV13) vaccine. When indicated, a person who is uncertain of her immunization history and has no record of immunization should receive the PCV13 vaccine. An adult aged 48 years or older who has certain medical conditions and has not been previously immunized should receive 1 dose of PCV13 vaccine. This PCV13 should be followed with a dose of pneumococcal polysaccharide (PPSV23) vaccine. The PPSV23 vaccine dose should be obtained at least 8 weeks after the dose of PCV13 vaccine. An adult aged 40 years or older who has certain medical conditions and previously received 1 or more doses of PPSV23 vaccine should receive 1 dose of PCV13. The PCV13 vaccine dose should be obtained 1 or more years after the last PPSV23 vaccine dose.    Pneumococcal polysaccharide (PPSV23) vaccine. When PCV13 is also indicated, PCV13 should be obtained first. All adults aged 66 years and older should be immunized. An adult younger than age 28 years who has certain medical conditions should be immunized. Any person who resides in a nursing home or long-term care facility should be immunized. An adult smoker should be immunized. People with an immunocompromised condition and certain other conditions should receive both PCV13 and PPSV23 vaccines. People with human immunodeficiency virus (HIV) infection should be immunized as soon as possible after diagnosis. Immunization during chemotherapy or radiation therapy should be avoided. Routine use of PPSV23 vaccine is not recommended for American Indians, [place] Natives, or people younger than 65 years unless there are medical conditions that require PPSV23 vaccine. When indicated, people who  have unknown immunization and have no record of  immunization should receive PPSV23 vaccine. One-time revaccination 5 years after the first dose of PPSV23 is recommended for people aged 19-64 years who have chronic kidney failure, nephrotic syndrome, asplenia, or immunocompromised conditions. People who received 1-2 doses of PPSV23 before age 15 years should receive another dose of PPSV23 vaccine at age 40 years or later if at least 5 years have passed since the previous dose. Doses of PPSV23 are not needed for people immunized with PPSV23 at or after age 68 years.  Preventive Services / Frequency   Ages 67 to 44 years  Blood pressure check.  Lipid and cholesterol check.  Lung cancer screening. / Every year if you are aged 37-80 years and have a 30-pack-year history of smoking and currently smoke or have quit within the past 15 years. Yearly screening is stopped once you have quit smoking for at least 15 years or develop a health problem that would prevent you from having lung cancer treatment.  Clinical breast exam.** / Every year after age 56 years.  BRCA-related cancer risk assessment.** / For women who have family members with a BRCA-related cancer (breast, ovarian, tubal, or peritoneal cancers).  Mammogram.** / Every year beginning at age 45 years and continuing for as long as you are in good health. Consult with your health care provider.  Pap test.** / Every 3 years starting at age 57 years through age 39 or 44 years with a history of 3 consecutive normal Pap tests.  HPV screening.** / Every 3 years from ages 39 years through ages 21 to 65 years with a history of 3 consecutive normal Pap tests.  Fecal occult blood test (FOBT) of stool. / Every year beginning at age 80 years and continuing until age 60 years. You may not need to do this test if you get a colonoscopy every 10 years.  Flexible sigmoidoscopy or colonoscopy.** / Every 5 years for a flexible sigmoidoscopy or every 10 years  for a colonoscopy beginning at age 44 years and continuing until age 71 years.  Hepatitis C blood test.** / For all people born from [birth year] through 1967 and any individual with known risks for hepatitis C.  Skin self-exam. / Monthly.  Influenza vaccine. / Every year.  Tetanus, diphtheria, and acellular pertussis (Tdap/Td) vaccine.** / Consult your health care provider. Pregnant women should receive 1 dose of Tdap vaccine during each pregnancy. 1 dose of Td every 10 years.  Varicella vaccine.** / Consult your health care provider. Pregnant females who do not have evidence of immunity should receive the first dose after pregnancy.  Zoster vaccine.** / 1 dose for adults aged 30 years or older.  Pneumococcal 13-valent conjugate (PCV13) vaccine.** / Consult your health care provider.  Pneumococcal polysaccharide (PPSV23) vaccine.** / 1 to 2 doses if you smoke cigarettes or if you have certain conditions.  Meningococcal vaccine.** / Consult your health care provider.  Hepatitis A vaccine.** / Consult your health care provider.  Hepatitis B vaccine.** / Consult your health care provider. Screening for abdominal aortic aneurysm (AAA)  by ultrasound is recommended for people over 50 who have history of high blood pressure or who are current or former smokers.

## 2015-05-22 NOTE — Progress Notes (Signed)
Patient ID: Ruth Ward, female   DOB: 1955-06-08, 60 y.o.   MRN: [id number]  Annual  Screening/Preventative Visit And Comprehensive Evaluation & Examination    This very nice 60 y.o. MWF presents for a Wellness/Preventative Visit & comprehensive evaluation and management of multiple medical co-morbidities.  Patient has been followed for HTN, Prediabetes, Hyperlipidemia and Vitamin D Deficiency.     HTN predates since 2000. Patient's BP has been controlled at home.Today's BP: 126/78 mmHg. Patient denies any cardiac symptoms as chest pain, palpitations, shortness of breath, dizziness or ankle swelling.     Patient's hyperlipidemia is controlled with diet and medications. Patient denies myalgias or other medication SE's. Last lipids were at goal with Total Chol 158, HDL 57, trig 188 and LDL 63 - at goal, but apparently patient has stopped he pravastatin.       Patient is screened proactively for prediabetes  and denies reactive hypoglycemic symptoms, visual blurring, diabetic polys or paresthesias. Last A1c was 5.1% in Mar 2016.     Finally, patient has history of Vitamin D Deficiency of 43 in 2008 and last vitamin D was still low at 44 in Mar 2016.     Medication Sig  . ALPRAZolam  1 MG tablet Take 1/2 to 1 tablet by mouth 2-3 times daily as needed.  Marland Kitchen aspirin 81 MG tablet Take 81 mg by mouth daily.  Marland Kitchen atenolol  100 MG tablet Take 1 tablet (100 mg total) by mouth daily. For BP  . VITAMIN D 2000 UNITS  Take 2,000 Units by mouth 2 (two) times daily.  . hctz 25 MG tablet Take 1 tablet (25 mg total) by mouth daily. (Patient not taking: Reported on 12/18/2014)  . ibuprofen  200 MG tablet Take 400 mg by mouth every 6 (six) hours as needed for headache.  Marland Kitchen KLOR-CON 20 MEQ tablet Take 20 mEq by mouth daily.   . ondansetron  4 MG tablet Take 4 mg by mouth every 8 (eight) hours as needed for nausea or vomiting.  . Potassium Chloride ER 20 MEQ  Take 1 tablet by mouth daily.  . pravastatin  40 MG  tablet Take 1 tablet (40 mg total) by mouth daily. (Patient not taking: Reported on 12/18/2014)   Allergies  Allergen Reactions  . Lipitor [Atorvastatin]    Past Medical History  Diagnosis Date  . Hypertension   . Hyperlipidemia   . Vitamin D deficiency   . Climacteric   . Fibrocystic breast   . Allergy    Health Maintenance  Topic Date Due  . PAP SMEAR  12/27/1974  . TETANUS/TDAP  02/16/2012  . ZOSTAVAX  12/26/2013  . COLONOSCOPY  08/01/2014  . MAMMOGRAM  04/10/2015  . INFLUENZA VACCINE  09/16/2015  . Hepatitis C Screening  Completed  . HIV Screening  Completed   Immunization History  Administered Date(s) Administered  . DTaP 03/02/2012  . Influenza-Unspecified 11/15/2012  . PPD Test 03/07/2013, 05/02/2014  . Pneumococcal-Unspecified 02/15/2009  . Td 02/15/2002   Past Surgical History  Procedure Laterality Date  . Cesarean section  1975 and 1980  . Tubal ligation     Family History  Problem Relation Age of Onset  . Hypertension Mother   . Diabetes Mother   . Hypertension Father   . Heart disease Father   . Cancer Father   . Hypertension Brother   . Diabetes Other    Social History   Social History  . Marital Status: Married    Spouse Name: N/A  .  Number of Children: N/A  . Years of Education: N/A   Occupational History  . Works Public librarian x 36 years for [employer] History Main Topics  . Smoking status: Former Smoker    Quit date: 02/15/1986  . Smokeless tobacco: Not on file  . Alcohol Use: No  . Drug Use: Not on file  . Sexual Activity: Active    ROS Constitutional: Denies fever, chills, weight loss/gain, headaches, insomnia,  night sweats or change in appetite. Does c/o fatigue. Eyes: Denies redness, blurred vision, diplopia, discharge, itchy or watery eyes.  ENT: Denies discharge, congestion, post nasal drip, epistaxis, sore throat, earache, hearing loss, dental pain, Tinnitus, Vertigo, Sinus pain or snoring.  Cardio: Denies  chest pain, palpitations, irregular heartbeat, syncope, dyspnea, diaphoresis, orthopnea, PND, claudication or edema Respiratory: denies cough, dyspnea, DOE, pleurisy, hoarseness, laryngitis or wheezing.  Gastrointestinal: Denies dysphagia, heartburn, reflux, water brash, pain, cramps, nausea, vomiting, bloating, diarrhea, constipation, hematemesis, melena, hematochezia, jaundice or hemorrhoids Genitourinary: Denies dysuria, frequency, urgency, nocturia, hesitancy, discharge, hematuria or flank pain Musculoskeletal: Denies arthralgia, myalgia, stiffness, Jt. Swelling, pain, limp or strain/sprain. Denies Falls. Skin: Denies puritis, rash, hives, warts, acne, eczema or change in skin lesion Neuro: No weakness, tremor, incoordination, spasms, paresthesia or pain Psychiatric: Denies confusion, memory loss or sensory loss. Denies Depression. Endocrine: Denies change in weight, skin, hair change, nocturia, and paresthesia, diabetic polys, visual blurring or hyper / hypo glycemic episodes.  Heme/Lymph: No excessive bleeding, bruising or enlarged lymph nodes.  Physical Exam  BP 126/78 mmHg  Pulse 60  Temp(Src) 97.6 F (36.4 C)  Resp 16  Ht 5' 6.5" (1.689 m)  Wt 178 lb 6.4 oz (80.922 kg)  BMI 28.37 kg/m2  General Appearance: Well nourished, in no apparent distress.  Eyes: PERRLA, EOMs, conjunctiva no swelling or erythema, normal fundi and vessels. Sinuses: No frontal/maxillary tenderness ENT/Mouth: EACs patent / TMs  nl. Nares clear without erythema, swelling, mucoid exudates. Oral hygiene is good. No erythema, swelling, or exudate. Tongue normal, non-obstructing. Tonsils not swollen or erythematous. Hearing normal.  Neck: Supple, thyroid normal. No bruits, nodes or JVD. Respiratory: Respiratory effort normal.  BS equal and clear bilateral without rales, rhonci, wheezing or stridor. Cardio: Heart sounds are normal with regular rate and rhythm and no murmurs, rubs or gallops. Peripheral pulses are  normal and equal bilaterally without edema. No aortic or femoral bruits. Breasts: No abnormal palpable masses. Chest: symmetric with normal excursions and percussion.  Abdomen: Soft, with Nl bowel sounds. Nontender, no guarding, rebound, hernias, masses, or organomegaly.  Lymphatics: Non tender without lymphadenopathy.  Musculoskeletal: Full ROM all peripheral extremities, joint stability, 5/5 strength, and normal gait. Skin: Warm and dry without rashes, lesions, cyanosis, clubbing or  ecchymosis.  Neuro: Cranial nerves intact, reflexes equal bilaterally. Normal muscle tone, no cerebellar symptoms. Sensation intact.  Pysch: Alert and oriented X 3 with normal affect, insight and judgment appropriate.   Assessment and Plan  1. Annual Preventative/Screening Exam   - Microalbumin / creatinine urine ratio - EKG 12-Lead - [place], RETROPERITNL ABD,  LTD - POC Hemoccult Bld/Stl  - Urinalysis, Routine w reflex microscopic - Vitamin B12 - Iron and TIBC - CBC with Differential/Platelet - BASIC METABOLIC PANEL WITH GFR - Hepatic function panel - Magnesium - Lipid panel - TSH - Hemoglobin A1c - Insulin, random - VITAMIN D 25 Hydroxy   2. Essential hypertension  - Microalbumin / creatinine urine ratio - EKG 12-Lead - [place], RETROPERITNL ABD,  LTD -  TSH  3. Hyperlipidemia  - Lipid panel - TSH  4. Prediabetes  - Hemoglobin A1c - Insulin, random  5. Vitamin D deficiency  - VITAMIN D 25 Hydroxy   6. Screening for rectal cancer  - POC Hemoccult Bld/Stl   7. Other fatigue  - Vitamin B12 - Iron and TIBC - CBC with Differential/Platelet - TSH  8. Screening for ischemic heart disease   9. Screening for AAA (aortic abdominal aneurysm)   10. Medication management  - Urinalysis, Routine w reflex microscopic  - CBC with Differential/Platelet - BASIC METABOLIC PANEL WITH GFR - Hepatic function panel - Magnesium  11. Screening examination for pulmonary tuberculosis  -  PPD   Continue prudent diet as discussed, weight control, BP monitoring, regular exercise, and medications as discussed.  Discussed med effects and SE's. Routine screening labs and tests as requested with regular follow-up as recommended. Over 40 minutes of exam, counseling, chart review and high complex critical decision making was performed

## 2015-05-23 LAB — URINALYSIS, MICROSCOPIC ONLY
Bacteria, UA: NONE SEEN [HPF]
CASTS: NONE SEEN [LPF]
CRYSTALS: NONE SEEN [HPF]
Yeast: NONE SEEN [HPF]

## 2015-05-23 LAB — URINALYSIS, ROUTINE W REFLEX MICROSCOPIC
BILIRUBIN URINE: NEGATIVE
Glucose, UA: NEGATIVE
Hgb urine dipstick: NEGATIVE
KETONES UR: NEGATIVE
NITRITE: NEGATIVE
PH: 6 (ref 5.0–8.0)
Protein, ur: NEGATIVE
SPECIFIC GRAVITY, URINE: 1.018 (ref 1.001–1.035)

## 2015-05-23 LAB — MICROALBUMIN / CREATININE URINE RATIO
CREATININE, URINE: 98 mg/dL (ref 20–320)
Microalb Creat Ratio: 7 mcg/mg creat (ref <30)
Microalb, Ur: 0.7 mg/dL

## 2015-05-23 LAB — HEMOGLOBIN A1C
Hgb A1c MFr Bld: 5.4 % (ref <5.7)
Mean Plasma Glucose: 108 mg/dL

## 2015-05-23 LAB — INSULIN, RANDOM: INSULIN: 15 u[IU]/mL (ref 2.0–19.6)

## 2015-05-23 LAB — VITAMIN D 25 HYDROXY (VIT D DEFICIENCY, FRACTURES): Vit D, 25-Hydroxy: 91 ng/mL (ref 30–100)

## 2015-05-24 LAB — URINE CULTURE: Colony Count: 45000

## 2015-05-28 LAB — TB SKIN TEST
INDURATION: 0 mm
TB SKIN TEST: NEGATIVE

## 2015-06-03 ENCOUNTER — Encounter: Payer: Self-pay | Admitting: Internal Medicine

## 2015-09-02 ENCOUNTER — Ambulatory Visit: Payer: Self-pay | Admitting: Internal Medicine

## 2015-10-03 ENCOUNTER — Encounter: Payer: Self-pay | Admitting: *Deleted

## 2015-10-14 ENCOUNTER — Other Ambulatory Visit: Payer: Self-pay | Admitting: Internal Medicine

## 2015-11-05 ENCOUNTER — Other Ambulatory Visit: Payer: Self-pay | Admitting: Internal Medicine

## 2015-12-02 ENCOUNTER — Encounter: Payer: Self-pay | Admitting: Internal Medicine

## 2015-12-02 ENCOUNTER — Ambulatory Visit (INDEPENDENT_AMBULATORY_CARE_PROVIDER_SITE_OTHER): Payer: 59 | Admitting: Internal Medicine

## 2015-12-02 ENCOUNTER — Other Ambulatory Visit (HOSPITAL_COMMUNITY)
Admission: RE | Admit: 2015-12-02 | Discharge: 2015-12-02 | Disposition: A | Discharge status: Home / Self Care | Payer: 59 | Source: Ambulatory Visit | Attending: Internal Medicine | Admitting: Internal Medicine

## 2015-12-02 VITALS — BP 122/74 | HR 68 | Temp 98.4°F | Resp 16 | Ht 66.5 in | Wt 186.0 lb

## 2015-12-02 DIAGNOSIS — Z23 Encounter for immunization: Secondary | ICD-10-CM | POA: Diagnosis not present

## 2015-12-02 DIAGNOSIS — Z01419 Encounter for gynecological examination (general) (routine) without abnormal findings: Secondary | ICD-10-CM | POA: Diagnosis present

## 2015-12-02 DIAGNOSIS — Z1151 Encounter for screening for human papillomavirus (HPV): Secondary | ICD-10-CM | POA: Insufficient documentation

## 2015-12-02 DIAGNOSIS — Z2911 Encounter for prophylactic immunotherapy for respiratory syncytial virus (RSV): Secondary | ICD-10-CM | POA: Diagnosis not present

## 2015-12-02 DIAGNOSIS — Z124 Encounter for screening for malignant neoplasm of cervix: Secondary | ICD-10-CM

## 2015-12-02 MED ORDER — SERTRALINE HCL 100 MG PO TABS: 100.0000 mg | ORAL_TABLET | Freq: Every day | ORAL | 2 refills | Status: DC

## 2015-12-02 NOTE — Progress Notes (Signed)
Assessment and Plan:  Hypertension:  -Continue medication,  -monitor blood pressure at home.  -Continue DASH diet.   -Reminder to go to the ER if any CP, SOB, nausea, dizziness, severe HA, changes vision/speech, left arm numbness and tingling, and jaw pain.  Cholesterol: -Continue diet and exercise.   Pre-diabetes: -Continue diet and exercise.   Vitamin D Def:  -continue medications.   Anxiety -increase zoloft to 100 mg  Need for cervical cancer screening -pap smear  Continue diet and meds as discussed. Further disposition pending results of labs.  HPI 60 y.o. female  presents for 3 month follow up with hypertension, hyperlipidemia, prediabetes and vitamin D.   Her blood pressure has been controlled at home, today their BP is BP: 122/74.   She does not workout. She denies chest pain, shortness of breath, dizziness.   She is on cholesterol medication and denies myalgias. Her cholesterol is at goal. The cholesterol last visit was:   Lab Results  Component Value Date   CHOL 136 05/22/2015   HDL 51 05/22/2015   LDLCALC 50 05/22/2015   TRIG 173 (H) 05/22/2015   CHOLHDL 2.7 05/22/2015     She has been working on diet and exercise for prediabetes, and denies foot ulcerations, hyperglycemia, hypoglycemia , increased appetite, nausea, paresthesia of the feet, polydipsia, polyuria, visual disturbances, vomiting and weight loss. Last A1C in the office was:  Lab Results  Component Value Date   HGBA1C 5.4 05/22/2015    Patient is on Vitamin D supplement.  Lab Results  Component Value Date   VD25OH 89 05/22/2015     She reports that she has been taking the zoloft because she is stressed.  She is having some sinus congestion and runny nose, but it is improving.    She is due for a pap smear and is willing to do this today.     Current Medications:  Current Outpatient Prescriptions on File Prior to Visit  Medication Sig Dispense Refill  . ALPRAZolam (XANAX) 1 MG tablet  Take 1/2 to 1 tablet by mouth 2-3 times daily as needed. 270 tablet 1  . aspirin 81 MG tablet Take 81 mg by mouth daily.    Marland Kitchen atenolol (TENORMIN) 100 MG tablet Take 1 tablet (100 mg total) by mouth daily. For BP 90 tablet 4  . Cholecalciferol (VITAMIN D) 2000 UNITS tablet Take 2,000 Units by mouth 2 (two) times daily.    . hydrochlorothiazide (HYDRODIURIL) 25 MG tablet TAKE 1 TABLET DAILY 90 tablet 0  . Potassium Chloride ER 20 MEQ TBCR Take 1 tablet by mouth daily. 90 tablet 4  . pravastatin (PRAVACHOL) 40 MG tablet TAKE 1 TABLET DAILY 90 tablet 0  . sertraline (ZOLOFT) 50 MG tablet TAKE 1 TABLET DAILY FOR MOOD 90 tablet 0   No current facility-administered medications on file prior to visit.     Medical History:  Past Medical History:  Diagnosis Date  . Allergy   . Climacteric   . Fibrocystic breast   . Hyperlipidemia   . Hypertension   . Vitamin D deficiency     Allergies:  Allergies  Allergen Reactions  . Lipitor [Atorvastatin]      Review of Systems:  Review of Systems  Constitutional: Negative for chills, fever and malaise/fatigue.  HENT: Negative for congestion, ear pain and sore throat.   Eyes: Negative.   Respiratory: Negative for cough, shortness of breath and wheezing.   Cardiovascular: Negative for chest pain, palpitations and leg swelling.  Gastrointestinal:  Negative for abdominal pain, blood in stool, constipation, diarrhea, heartburn and melena.  Genitourinary: Negative.   Skin: Negative.   Neurological: Negative for dizziness, sensory change, loss of consciousness and headaches.  Psychiatric/Behavioral: Negative for depression. The patient is not nervous/anxious and does not have insomnia.     Family history- Review and unchanged  Social history- Review and unchanged  Physical Exam: BP 122/74   Pulse 68   Temp 98.4 F (36.9 C) (Temporal)   Resp 16   Ht 5' 6.5" (1.689 m)   Wt 186 lb (84.4 kg)   BMI 29.57 kg/m  Wt Readings from Last 3 Encounters:   12/02/15 186 lb (84.4 kg)  05/22/15 178 lb 6.4 oz (80.9 kg)  12/18/14 150 lb (68 kg)    General Appearance: Well nourished well developed, in no apparent distress. Eyes: PERRLA, EOMs, conjunctiva no swelling or erythema ENT/Mouth: Ear canals normal without obstruction, swelling, erythma, discharge.  TMs normal bilaterally.  Oropharynx moist, clear, without exudate, or postoropharyngeal swelling. Neck: Supple, thyroid normal,no cervical adenopathy  Respiratory: Respiratory effort normal, Breath sounds clear A&P without rhonchi, wheeze, or rale.  No retractions, no accessory usage. Cardio: RRR with no MRGs. Brisk peripheral pulses without edema.  Abdomen: Soft, + BS,  Non tender, no guarding, rebound, hernias, masses. Musculoskeletal: Full ROM, 5/5 strength, Normal gait Skin: Warm, dry without rashes, lesions, ecchymosis.  Genital:  Normal external female genitalia.  Mild vaginal atrophy secondary to age.  NO palpable ovaries.  Cervic parous and without obvious deformity  No CMT Neuro: Awake and oriented X 3, Cranial nerves intact. Normal muscle tone, no cerebellar symptoms. Psych: Normal affect, Insight and Judgment appropriate.    Starlyn Skeans, PA-C 4:37 PM [facility]

## 2015-12-05 LAB — CYTOLOGY - PAP
Diagnosis: NEGATIVE
HPV: NOT DETECTED

## 2015-12-08 ENCOUNTER — Ambulatory Visit: Payer: Self-pay | Admitting: Internal Medicine

## 2015-12-24 ENCOUNTER — Encounter: Payer: Self-pay | Admitting: Internal Medicine

## 2015-12-24 ENCOUNTER — Other Ambulatory Visit: Payer: Self-pay | Admitting: Internal Medicine

## 2015-12-24 MED ORDER — ALPRAZOLAM 1 MG PO TABS: ORAL_TABLET | ORAL | 1 refills | Status: DC

## 2016-01-12 ENCOUNTER — Other Ambulatory Visit: Payer: Self-pay | Admitting: Internal Medicine

## 2016-02-03 ENCOUNTER — Other Ambulatory Visit: Payer: Self-pay | Admitting: Physician Assistant

## 2016-03-19 ENCOUNTER — Encounter: Payer: Self-pay | Admitting: Internal Medicine

## 2016-03-19 ENCOUNTER — Other Ambulatory Visit: Payer: Self-pay | Admitting: Internal Medicine

## 2016-03-19 MED ORDER — ALPRAZOLAM 1 MG PO TABS: ORAL_TABLET | ORAL | 1 refills | Status: DC

## 2016-03-22 ENCOUNTER — Encounter: Payer: Self-pay | Admitting: Internal Medicine

## 2016-03-22 ENCOUNTER — Other Ambulatory Visit: Payer: Self-pay | Admitting: *Deleted

## 2016-03-22 DIAGNOSIS — I10 Essential (primary) hypertension: Secondary | ICD-10-CM

## 2016-03-22 MED ORDER — PRAVASTATIN SODIUM 40 MG PO TABS: 40.0000 mg | ORAL_TABLET | Freq: Every day | ORAL | 2 refills | Status: DC

## 2016-03-22 MED ORDER — SERTRALINE HCL 100 MG PO TABS: 100.0000 mg | ORAL_TABLET | Freq: Every day | ORAL | 2 refills | Status: DC

## 2016-03-22 MED ORDER — ATENOLOL 100 MG PO TABS: 100.0000 mg | ORAL_TABLET | Freq: Every day | ORAL | 2 refills | Status: DC

## 2016-03-22 MED ORDER — ALPRAZOLAM 1 MG PO TABS: ORAL_TABLET | ORAL | 0 refills | Status: DC

## 2016-04-11 ENCOUNTER — Other Ambulatory Visit: Payer: Self-pay | Admitting: Internal Medicine

## 2016-04-26 ENCOUNTER — Other Ambulatory Visit: Payer: Self-pay | Admitting: *Deleted

## 2016-04-26 MED ORDER — POTASSIUM CHLORIDE CRYS ER 20 MEQ PO TBCR: 20.0000 meq | EXTENDED_RELEASE_TABLET | Freq: Every day | ORAL | 1 refills | Status: DC

## 2016-05-03 ENCOUNTER — Other Ambulatory Visit: Payer: Self-pay | Admitting: Internal Medicine

## 2016-07-05 ENCOUNTER — Encounter: Payer: Self-pay | Admitting: Internal Medicine

## 2016-07-15 ENCOUNTER — Other Ambulatory Visit: Payer: Self-pay | Admitting: Internal Medicine

## 2016-09-01 LAB — HM MAMMOGRAPHY

## 2016-09-15 HISTORY — PX: ABDOMINAL SURGERY: SHX537

## 2016-09-16 NOTE — Progress Notes (Signed)
Complete Physical  Assessment and Plan: Essential hypertension - continue medications, DASH diet, exercise and monitor at home. Call if greater than 130/80.  -     CBC with Differential/Platelet -     BASIC METABOLIC PANEL WITH GFR -     Hepatic function panel -     TSH -     Urinalysis, Routine w reflex microscopic -     Microalbumin / creatinine urine ratio -     EKG 12-Lead  Hyperlipidemia, unspecified hyperlipidemia type -continue medications, check lipids, decrease fatty foods, increase activity.  -     Lipid panel  Prediabetes Discussed general issues about diabetes pathophysiology and management., Educational material distributed., Suggested low cholesterol diet., Encouraged aerobic exercise., Discussed foot care., Reminded to get yearly retinal exam.  Medication management -     Magnesium  Vitamin D deficiency -     VITAMIN D 25 Hydroxy (Vit-D Deficiency, Fractures)  Encounter for general adult medical examination with abnormal findings  Anemia, unspecified type -     Iron and TIBC -     Vitamin B12  Sinus bradycardia Will send in atenolol 25 mg for her to take, monitor BP  Discussed med's effects and SE's. Screening labs and tests as requested with regular follow-up as recommended. Over 40 minutes of exam, counseling, chart review, and complex, high level critical decision making was performed this visit.   HPI  61 y.o. female  presents for a complete physical and follow up for has Hypertension; Hyperlipidemia; Vitamin D deficiency; Prediabetes; Medication management; and Depression, major, recurrent, in partial remission ([place]) on her problem list..  Her blood pressure has been controlled at home, today their BP is BP: 120/80 She does not workout. She denies chest pain, shortness of breath, dizziness.   Getting tummy tuck later this month, going to retire in 3 1/2 years, has date set.  4 grand kids. 32-12 year olds  She is on cholesterol medication and denies  myalgias. Her cholesterol is at goal. The cholesterol last visit was:   Lab Results  Component Value Date   CHOL 136 05/22/2015   HDL 51 05/22/2015   LDLCALC 50 05/22/2015   TRIG 173 (H) 05/22/2015   CHOLHDL 2.7 05/22/2015   Last A1C in the office was:  Lab Results  Component Value Date   HGBA1C 5.4 05/22/2015   Last GFR: Lab Results  Component Value Date   GFRNONAA >89 05/22/2015   Patient is on Vitamin D supplement.   Lab Results  Component Value Date   VD25OH 91 05/22/2015     On zoloft daily, on xanax occ at night for sleep, will have trouble initiating sleep and staying asleep.  BMI is Body mass index is 29.44 kg/m., she is working on diet and exercise. Wt Readings from Last 3 Encounters:  09/17/16 182 lb 6.4 oz (82.7 kg)  12/02/15 186 lb (84.4 kg)  05/22/15 178 lb 6.4 oz (80.9 kg)    Current Medications:  Current Outpatient Prescriptions on File Prior to Visit  Medication Sig Dispense Refill  . ALPRAZolam (XANAX) 1 MG tablet Take 1/2 to 1 tablet by mouth 2-3 times daily as needed. 270 tablet 0  . aspirin 81 MG tablet Take 81 mg by mouth daily.    Marland Kitchen atenolol (TENORMIN) 100 MG tablet Take 1 tablet (100 mg total) by mouth daily. For BP 90 tablet 2  . Cholecalciferol (VITAMIN D) 2000 UNITS tablet Take 2,000 Units by mouth 2 (two) times daily.    Marland Kitchen  hydrochlorothiazide (HYDRODIURIL) 25 MG tablet TAKE 1 TABLET DAILY 90 tablet 1  . potassium chloride SA (K-DUR,KLOR-CON) 20 MEQ tablet Take 1 tablet (20 mEq total) by mouth daily. 90 tablet 1  . pravastatin (PRAVACHOL) 40 MG tablet TAKE 1 TABLET DAILY 90 tablet 0  . sertraline (ZOLOFT) 100 MG tablet Take 1 tablet (100 mg total) by mouth daily. 90 tablet 2   No current facility-administered medications on file prior to visit.    Allergies:  Allergies  Allergen Reactions  . Lipitor [Atorvastatin]    Medical History:  She has Hypertension; Hyperlipidemia; Vitamin D deficiency; Prediabetes; Medication management; and  Depression, major, recurrent, in partial remission ([place]) on her problem list. Health Maintenance:   Immunization History  Administered Date(s) Administered  . DTaP 03/02/2012  . Influenza-Unspecified 11/15/2012  . PPD Test 03/07/2013, 05/02/2014, 05/22/2015  . Pneumococcal-Unspecified 02/15/2009  . Td 02/15/2002  . Zoster 12/02/2015   Tetanus: 2014 Pneumovax: 2011 Prevnar 13:  Flu vaccine: 2017 at work Zostavax: 2017  LMP: Pap: 11/2015 neg HPV and normal MGM: 08/2016 at work DEXA: Colonoscopy: 2006, Dr. Fuller Plan, insurance does not cover EGD: Ct head 2008 CXR 2009  Last Dental Exam: Melina Copa q 6 months Last Eye Exam: Happy eyes 2017 Patient Care Team: Unk Pinto, MD as PCP - General (Internal Medicine)  Surgical History:  She has a past surgical history that includes Cesarean section (1975 and 1980) and Tubal ligation. Family History:  Herfamily history includes Cancer in her father; Diabetes in her mother and other; Heart disease in her father; Hypertension in her brother, father, and mother. Social History:  She reports that she quit smoking about 30 years ago. She has never used smokeless tobacco. She reports that she does not drink alcohol.  Review of Systems: Review of Systems  Constitutional: Negative.   HENT: Negative.   Eyes: Negative.   Respiratory: Negative.   Cardiovascular: Negative.   Gastrointestinal: Negative.   Genitourinary: Negative.   Musculoskeletal: Negative.   Skin: Negative.   Neurological: Negative.   Endo/Heme/Allergies: Negative.   Psychiatric/Behavioral: Negative.     Physical Exam: Estimated body mass index is 29.44 kg/m as calculated from the following:   Height as of this encounter: 5\' 6"  (1.676 m).   Weight as of this encounter: 182 lb 6.4 oz (82.7 kg). BP 120/80   Pulse (!) 54   Temp 97.9 F (36.6 C)   Resp 16   Ht 5\' 6"  (1.676 m)   Wt 182 lb 6.4 oz (82.7 kg)   SpO2 98%   BMI 29.44 kg/m  General Appearance: Well  nourished, in no apparent distress.  Eyes: PERRLA, EOMs, conjunctiva no swelling or erythema, normal fundi and vessels.  Sinuses: No Frontal/maxillary tenderness  ENT/Mouth: Ext aud canals clear, normal light reflex with TMs without erythema, bulging. Good dentition. No erythema, swelling, or exudate on post pharynx. Tonsils not swollen or erythematous. Hearing normal.  Neck: Supple, thyroid normal. No bruits  Respiratory: Respiratory effort normal, BS equal bilaterally without rales, rhonchi, wheezing or stridor.  Cardio: RRR without murmurs, rubs or gallops. Brisk peripheral pulses without edema.  Chest: symmetric, with normal excursions and percussion.  Breasts: Symmetric, without lumps, nipple discharge, retractions.  Abdomen: Soft, nontender, no guarding, rebound, hernias, masses, or organomegaly.  Lymphatics: Non tender without lymphadenopathy.  Genitourinary: defer Musculoskeletal: Full ROM all peripheral extremities,5/5 strength, and normal gait.  Skin: Warm, dry without rashes, lesions, ecchymosis. Neuro: Cranial nerves intact, reflexes equal bilaterally. Normal muscle tone, no cerebellar symptoms. Sensation  intact.  Psych: Awake and oriented X 3, normal affect, Insight and Judgment appropriate.   EKG: WNL no ST changes sinus brady from atenolol. AORTA SCAN: declines   Ruth Ward 9:48 AM [facility]

## 2016-09-17 ENCOUNTER — Encounter: Payer: Self-pay | Admitting: Physician Assistant

## 2016-09-17 ENCOUNTER — Ambulatory Visit (INDEPENDENT_AMBULATORY_CARE_PROVIDER_SITE_OTHER): Payer: 59 | Admitting: Physician Assistant

## 2016-09-17 ENCOUNTER — Encounter: Payer: Self-pay | Admitting: Internal Medicine

## 2016-09-17 VITALS — BP 120/80 | HR 54 | Temp 97.9°F | Resp 16 | Ht 66.0 in | Wt 182.4 lb

## 2016-09-17 DIAGNOSIS — R7303 Prediabetes: Secondary | ICD-10-CM

## 2016-09-17 DIAGNOSIS — Z136 Encounter for screening for cardiovascular disorders: Secondary | ICD-10-CM | POA: Diagnosis not present

## 2016-09-17 DIAGNOSIS — E785 Hyperlipidemia, unspecified: Secondary | ICD-10-CM

## 2016-09-17 DIAGNOSIS — Z79899 Other long term (current) drug therapy: Secondary | ICD-10-CM | POA: Diagnosis not present

## 2016-09-17 DIAGNOSIS — E559 Vitamin D deficiency, unspecified: Secondary | ICD-10-CM | POA: Diagnosis not present

## 2016-09-17 DIAGNOSIS — Z0001 Encounter for general adult medical examination with abnormal findings: Secondary | ICD-10-CM

## 2016-09-17 DIAGNOSIS — I10 Essential (primary) hypertension: Secondary | ICD-10-CM | POA: Diagnosis not present

## 2016-09-17 DIAGNOSIS — R001 Bradycardia, unspecified: Secondary | ICD-10-CM

## 2016-09-17 DIAGNOSIS — D649 Anemia, unspecified: Secondary | ICD-10-CM

## 2016-09-17 LAB — CBC WITH DIFFERENTIAL/PLATELET
BASOS ABS: 65 {cells}/uL (ref 0–200)
Basophils Relative: 1 %
EOS ABS: 65 {cells}/uL (ref 15–500)
Eosinophils Relative: 1 %
HEMATOCRIT: 41 % (ref 35.0–45.0)
HEMOGLOBIN: 13.9 g/dL (ref 11.7–15.5)
LYMPHS ABS: 2860 {cells}/uL (ref 850–3900)
Lymphocytes Relative: 44 %
MCH: 32.5 pg (ref 27.0–33.0)
MCHC: 33.9 g/dL (ref 32.0–36.0)
MCV: 95.8 fL (ref 80.0–100.0)
MPV: 10.1 fL (ref 7.5–12.5)
Monocytes Absolute: 455 cells/uL (ref 200–950)
Monocytes Relative: 7 %
Neutro Abs: 3055 cells/uL (ref 1500–7800)
Neutrophils Relative %: 47 %
Platelets: 278 10*3/uL (ref 140–400)
RBC: 4.28 MIL/uL (ref 3.80–5.10)
RDW: 13.4 % (ref 11.0–15.0)
WBC: 6.5 10*3/uL (ref 3.8–10.8)

## 2016-09-17 MED ORDER — ATENOLOL 25 MG PO TABS: 25.0000 mg | ORAL_TABLET | Freq: Every day | ORAL | 3 refills | Status: DC

## 2016-09-17 NOTE — Patient Instructions (Addendum)
Try the melatonin '5mg'$ -'20mg'$  dissolvable or gummy 30 mins before bed  11 Tips to Follow:  1. No caffeine after 3pm: Avoid beverages with caffeine (soda, tea, energy drinks, etc.) especially after 3pm. 2. Don't go to bed hungry: Have your evening meal at least 3 hrs. before going to sleep. It's fine to have a small bedtime snack such as a glass of milk and a few crackers but don't have a big meal. 3. Have a nightly routine before bed: Plan on "winding down" before you go to sleep. Begin relaxing about 1 hour before you go to bed. Try doing a quiet activity such as listening to calming music, reading a book or meditating. 4. Turn off the TV and ALL electronics including video games, tablets, laptops, etc. 1 hour before sleep, and keep them out of the bedroom. 5. Turn off your cell phone and all notifications (new email and text alerts) or even better, leave your phone outside your room while you sleep. Studies have shown that a part of your brain continues to respond to certain lights and sounds even while you're still asleep. 6. Make your bedroom quiet, dark and cool. If you can't control the noise, try wearing earplugs or using a fan to block out other sounds. 7. Practice relaxation techniques. Try reading a book or meditating or drain your brain by writing a list of what you need to do the next day. 8. Don't nap unless you feel sick: you'll have a better night's sleep. 9. Don't smoke, or quit if you do. Nicotine, alcohol, and marijuana can all keep you awake. Talk to your health care provider if you need help with substance use. 10. Most importantly, wake up at the same time every day (or within 1 hour of your usual wake up time) EVEN on the weekends. A regular wake up time promotes sleep hygiene and prevents sleep problems. 11. Reduce exposure to bright light in the last three hours of the day before going to sleep. Maintaining good sleep hygiene and having good sleep habits lower your risk of  developing sleep problems. Getting better sleep can also improve your concentration and alertness. Try the simple steps in this guide. If you still have trouble getting enough rest, make an appointment with your health care provider.   [place] with no obligation # [phone] MUST BE A MEMBER Call for store hours  Cologuard is an easy to use noninvasive colon cancer screening test based on the latest advances in stool DNA science.   Colon cancer is 3rd most diagnosed cancer and 2nd leading cause of death in both men and women 1 years of age and older despite being one of the most preventable and treatable cancers if found early.  4 of out 5 people diagnosed with colon cancer have NO prior family history.  When caught EARLY 90% of colon cancer is curable.   You have agreed to do a Cologuard screening and have declined a colonoscopy in spite of being explained the risks and benefits of the colonoscopy in detail, including cancer and death. Please understand that this is test not as sensitive or specific as a colonoscopy and you are still recommended to get a colonoscopy.   If you are NOT medicare please call your insurance company and give them these items to see if they will cover it: 1) CPT code, [phone] 2) Provider is Probation officer 3) [employer] NPI [id number] 4) [place] Tax ID #[id number]  Out-of-pocket cost for Cologuard can range from $0 - $649 so please call  You will receive a short call from [place] support center at [employer], when you receive a call they will say they are from [place],  to confirm your mailing address and give you more information.  When they calll you, it will appear on the caller ID as "[employer]" or in some cases only this number will appear, [phone].   [employer] will ship your collection kit directly to you. You will collect a single stool sample in  the privacy of your own home, no special preparation required. You will return the kit via [facility] pre-paid shipping or pick-up, in the same box it arrived in. Then I will contact you to discuss your results after I receive them from the laboratory.   If you have any questions or concerns, Cologuard Customer Support Specialist are available 24 hours a day, 7 days a week at [phone] or go to [email or web address].    Do HCTZ every other day  [place]   Do not stand or sit in one position for long periods of time. Do not sit with your legs crossed. Rest with your legs raised during the day.  Your legs have to be higher than your heart so that gravity will force the valves to open, so please really elevate your legs.   Wear elastic stockings or support hose. Do not wear other tight, encircling garments around the legs, pelvis, or waist.  [employer]  has a wide variety of well priced compression stockings. [place], [place] [address] [phone]  Walk as much as possible to increase blood flow.  Raise the foot of your bed at night with 2-inch blocks. SEEK MEDICAL CARE IF:   The skin around your ankle starts to break down.  You have pain, redness, tenderness, or hard swelling developing in your leg over a vein.  You are uncomfortable due to leg pain. Document Released: 11/11/2004 Document Revised: 04/26/2011 Document Reviewed: 03/30/2010 [facility] Patient Information 2014 [place].

## 2016-09-18 LAB — LIPID PANEL
CHOLESTEROL: 187 mg/dL (ref <200)
HDL: 57 mg/dL (ref >50)
LDL CALC: 86 mg/dL (ref <100)
TRIGLYCERIDES: 218 mg/dL — AB (ref <150)
Total CHOL/HDL Ratio: 3.3 Ratio (ref <5.0)
VLDL: 44 mg/dL — AB (ref <30)

## 2016-09-18 LAB — VITAMIN B12: Vitamin B-12: 370 pg/mL (ref 200–1100)

## 2016-09-18 LAB — URINALYSIS, ROUTINE W REFLEX MICROSCOPIC
Bilirubin Urine: NEGATIVE
Glucose, UA: NEGATIVE
Hgb urine dipstick: NEGATIVE
KETONES UR: NEGATIVE
Leukocytes, UA: NEGATIVE
Nitrite: NEGATIVE
PH: 7.5 (ref 5.0–8.0)
Protein, ur: NEGATIVE
Specific Gravity, Urine: 1.007 (ref 1.001–1.035)

## 2016-09-18 LAB — IRON AND TIBC
%SAT: 35 % (ref 11–50)
Iron: 139 ug/dL (ref 45–160)
TIBC: 395 ug/dL (ref 250–450)
UIBC: 256 ug/dL

## 2016-09-18 LAB — MICROALBUMIN / CREATININE URINE RATIO
CREATININE, URINE: 31 mg/dL (ref 20–320)
MICROALB UR: 0.2 mg/dL
MICROALB/CREAT RATIO: 6 ug/mg{creat} (ref <30)

## 2016-09-18 LAB — BASIC METABOLIC PANEL WITH GFR
BUN: 12 mg/dL (ref 7–25)
CALCIUM: 9.5 mg/dL (ref 8.6–10.4)
CHLORIDE: 99 mmol/L (ref 98–110)
CO2: 26 mmol/L (ref 20–31)
CREATININE: 0.67 mg/dL (ref 0.50–0.99)
GFR, Est African American: >89 mL/min (ref >=60)
GFR, Est Non African American: >89 mL/min (ref >=60)
GLUCOSE: 76 mg/dL (ref 65–99)
Potassium: 3.8 mmol/L (ref 3.5–5.3)
Sodium: 139 mmol/L (ref 135–146)

## 2016-09-18 LAB — HEPATIC FUNCTION PANEL
ALBUMIN: 4.3 g/dL (ref 3.6–5.1)
ALT: 46 U/L — ABNORMAL HIGH (ref 6–29)
AST: 31 U/L (ref 10–35)
Alkaline Phosphatase: 94 U/L (ref 33–130)
BILIRUBIN INDIRECT: 0.3 mg/dL (ref 0.2–1.2)
BILIRUBIN TOTAL: 0.4 mg/dL (ref 0.2–1.2)
Bilirubin, Direct: 0.1 mg/dL (ref <=0.2)
Total Protein: 6.8 g/dL (ref 6.1–8.1)

## 2016-09-18 LAB — VITAMIN D 25 HYDROXY (VIT D DEFICIENCY, FRACTURES): Vit D, 25-Hydroxy: 66 ng/mL (ref 30–100)

## 2016-09-18 LAB — TSH: TSH: 2.61 mIU/L

## 2016-09-18 LAB — MAGNESIUM: Magnesium: 2.3 mg/dL (ref 1.5–2.5)

## 2016-09-22 ENCOUNTER — Encounter: Payer: Self-pay | Admitting: Physician Assistant

## 2016-09-22 DIAGNOSIS — I10 Essential (primary) hypertension: Secondary | ICD-10-CM

## 2016-09-22 DIAGNOSIS — R001 Bradycardia, unspecified: Secondary | ICD-10-CM

## 2016-09-22 MED ORDER — ATENOLOL 25 MG PO TABS: 25.0000 mg | ORAL_TABLET | Freq: Every day | ORAL | 1 refills | Status: DC

## 2016-09-27 ENCOUNTER — Encounter: Payer: Self-pay | Admitting: Physician Assistant

## 2016-09-27 ENCOUNTER — Ambulatory Visit (INDEPENDENT_AMBULATORY_CARE_PROVIDER_SITE_OTHER): Payer: 59 | Admitting: Physician Assistant

## 2016-09-27 VITALS — BP 120/74 | HR 67 | Temp 97.6°F | Ht 66.0 in | Wt 184.4 lb

## 2016-09-27 DIAGNOSIS — H1033 Unspecified acute conjunctivitis, bilateral: Secondary | ICD-10-CM | POA: Diagnosis not present

## 2016-09-27 MED ORDER — CIPROFLOXACIN HCL 0.3 % OP SOLN: OPHTHALMIC | 0 refills | Status: DC

## 2016-09-27 NOTE — Progress Notes (Signed)
Subjective:    Patient ID: Ruth Ward, female    DOB: [birth year], 61 y.o.   MRN: [id number]  HPI  61 y.o. WF presents with bilateral eye discomfort.  She had bilateral eye swelling, eye discharge since Thursday, started on maxitrol and predforte, still having itching, burning, discomfort, blurred vision, and still having yellow discharge, thick from eyes.    Blood pressure 120/74, pulse 67, temperature 97.6 F (36.4 C), height 5\' 6"  (1.676 m), weight 184 lb 6.4 oz (83.6 kg), SpO2 97 %.  Medications Allergies as of 09/27/2016      Reactions   Lipitor [atorvastatin]       Medication List       Accurate as of 09/27/16  4:07 PM. Always use your most recent med list.          ALPRAZolam 1 MG tablet Commonly known as:  XANAX Take 1/2 to 1 tablet by mouth 2-3 times daily as needed.   aspirin 81 MG tablet Take 81 mg by mouth daily.   atenolol 25 MG tablet Commonly known as:  TENORMIN Take 1 tablet (25 mg total) by mouth daily. For BP   cephALEXin 500 MG capsule Commonly known as:  KEFLEX Take 500 mg by mouth 2 (two) times daily.   hydrochlorothiazide 25 MG tablet Commonly known as:  HYDRODIURIL TAKE 1 TABLET DAILY   neomycin-polymyxin b-dexamethasone 3.5-10000-0.1 Oint Commonly known as:  MAXITROL Place 1 application into both eyes.   potassium chloride SA 20 MEQ tablet Commonly known as:  K-DUR,KLOR-CON Take 1 tablet (20 mEq total) by mouth daily.   pravastatin 40 MG tablet Commonly known as:  PRAVACHOL TAKE 1 TABLET DAILY   prednisoLONE acetate 1 % ophthalmic suspension Commonly known as:  PRED FORTE Place 1 drop into both eyes 4 (four) times daily.   predniSONE 20 MG tablet Commonly known as:  DELTASONE Take 20 mg by mouth daily with breakfast.   sertraline 100 MG tablet Commonly known as:  ZOLOFT Take 1 tablet (100 mg total) by mouth daily.   Vitamin D 2000 units tablet Take 2,000 Units by mouth 2 (two) times daily.       Problem list She  has Hypertension; Hyperlipidemia; Vitamin D deficiency; Prediabetes; Medication management; and Depression, major, recurrent, in partial remission ([place]) on her problem list.   Review of Systems  Constitutional: Negative.  Negative for chills and fever.  HENT: Negative.   Eyes: Positive for pain, discharge, redness and itching. Negative for photophobia and visual disturbance.  Respiratory: Negative.   Neurological: Negative.        Objective:   Physical Exam  Constitutional: She is oriented to person, place, and time. She appears well-developed and well-nourished.  HENT:  Head: Normocephalic and atraumatic.  Right Ear: External ear normal.  Left Ear: External ear normal.  Mouth/Throat: Oropharynx is clear and moist.  Eyes: Pupils are equal, round, and reactive to light. EOM are normal. Lids are everted and swept, no foreign bodies found. Right eye exhibits discharge. Right eye exhibits no hordeolum. No foreign body present in the right eye. Left eye exhibits no discharge and no hordeolum. No foreign body present in the left eye. Right conjunctiva is injected. Left conjunctiva is injected.  Neck: Normal range of motion. Neck supple. No thyromegaly present.  Cardiovascular: Normal rate, regular rhythm and normal heart sounds.  Exam reveals no gallop and no friction rub.   No murmur heard. Pulmonary/Chest: Effort normal and breath sounds normal. No respiratory distress. She has  no wheezes.  Abdominal: Soft. Bowel sounds are normal. She exhibits no distension and no mass. There is no tenderness. There is no rebound and no guarding.  Musculoskeletal: Normal range of motion.  Lymphadenopathy:    She has no cervical adenopathy.  Neurological: She is alert and oriented to person, place, and time. She displays normal reflexes. No cranial nerve deficit. Coordination normal.  Skin: Skin is warm and dry.  Psychiatric: She has a normal mood and affect.      Assessment & Plan:   Acute bacterial  conjunctivitis of both eyes -     ciprofloxacin (CILOXAN) 0.3 % ophthalmic solution; 2 gtt in eye(s) Q2Hours while awake for 2 days, then 2 gtt in eye(s) Q4Hours for 5 days.

## 2016-09-27 NOTE — Patient Instructions (Signed)

## 2016-10-12 ENCOUNTER — Other Ambulatory Visit: Payer: Self-pay | Admitting: Internal Medicine

## 2016-10-12 ENCOUNTER — Other Ambulatory Visit: Payer: Self-pay

## 2016-10-12 MED ORDER — POTASSIUM CHLORIDE CRYS ER 20 MEQ PO TBCR: 20.0000 meq | EXTENDED_RELEASE_TABLET | Freq: Every day | ORAL | 1 refills | Status: DC

## 2016-10-14 ENCOUNTER — Other Ambulatory Visit: Payer: Self-pay | Admitting: Plastic Surgery

## 2016-12-26 ENCOUNTER — Other Ambulatory Visit: Payer: Self-pay | Admitting: Internal Medicine

## 2016-12-29 ENCOUNTER — Ambulatory Visit: Payer: Self-pay | Admitting: Physician Assistant

## 2017-01-18 ENCOUNTER — Encounter (INDEPENDENT_AMBULATORY_CARE_PROVIDER_SITE_OTHER): Payer: Self-pay

## 2017-01-18 ENCOUNTER — Other Ambulatory Visit: Payer: Self-pay | Admitting: *Deleted

## 2017-01-18 DIAGNOSIS — I10 Essential (primary) hypertension: Secondary | ICD-10-CM

## 2017-01-18 DIAGNOSIS — R001 Bradycardia, unspecified: Secondary | ICD-10-CM

## 2017-01-18 MED ORDER — POTASSIUM CHLORIDE CRYS ER 20 MEQ PO TBCR: 20.0000 meq | EXTENDED_RELEASE_TABLET | Freq: Every day | ORAL | 1 refills | Status: DC

## 2017-01-18 MED ORDER — SERTRALINE HCL 100 MG PO TABS: 100.0000 mg | ORAL_TABLET | Freq: Every day | ORAL | 1 refills | Status: DC

## 2017-01-18 MED ORDER — HYDROCHLOROTHIAZIDE 25 MG PO TABS: 25.0000 mg | ORAL_TABLET | Freq: Every day | ORAL | 1 refills | Status: DC

## 2017-01-18 MED ORDER — PRAVASTATIN SODIUM 40 MG PO TABS: 40.0000 mg | ORAL_TABLET | Freq: Every day | ORAL | 1 refills | Status: DC

## 2017-01-18 MED ORDER — ATENOLOL 25 MG PO TABS: 25.0000 mg | ORAL_TABLET | Freq: Every day | ORAL | 1 refills | Status: DC

## 2017-02-19 ENCOUNTER — Other Ambulatory Visit: Payer: Self-pay | Admitting: Internal Medicine

## 2017-02-19 MED ORDER — POTASSIUM CHLORIDE CRYS ER 20 MEQ PO TBCR: 20.0000 meq | EXTENDED_RELEASE_TABLET | Freq: Every day | ORAL | 1 refills | Status: DC

## 2017-02-22 ENCOUNTER — Other Ambulatory Visit: Payer: Self-pay | Admitting: *Deleted

## 2017-02-22 MED ORDER — POTASSIUM CHLORIDE CRYS ER 20 MEQ PO TBCR: 20.0000 meq | EXTENDED_RELEASE_TABLET | Freq: Every day | ORAL | 1 refills | Status: DC

## 2017-02-24 ENCOUNTER — Emergency Department (HOSPITAL_COMMUNITY): Payer: BLUE CROSS/BLUE SHIELD

## 2017-02-24 ENCOUNTER — Other Ambulatory Visit: Payer: Self-pay

## 2017-02-24 ENCOUNTER — Encounter (HOSPITAL_COMMUNITY): Payer: Self-pay

## 2017-02-24 ENCOUNTER — Emergency Department (HOSPITAL_COMMUNITY)
Admission: EM | Admit: 2017-02-24 | Discharge: 2017-02-24 | Disposition: A | Discharge status: Home / Self Care | Payer: BLUE CROSS/BLUE SHIELD | Attending: Emergency Medicine | Admitting: Emergency Medicine

## 2017-02-24 DIAGNOSIS — K529 Noninfective gastroenteritis and colitis, unspecified: Secondary | ICD-10-CM | POA: Diagnosis not present

## 2017-02-24 DIAGNOSIS — Z7982 Long term (current) use of aspirin: Secondary | ICD-10-CM | POA: Insufficient documentation

## 2017-02-24 DIAGNOSIS — Z87891 Personal history of nicotine dependence: Secondary | ICD-10-CM | POA: Insufficient documentation

## 2017-02-24 DIAGNOSIS — Z79899 Other long term (current) drug therapy: Secondary | ICD-10-CM | POA: Insufficient documentation

## 2017-02-24 DIAGNOSIS — I10 Essential (primary) hypertension: Secondary | ICD-10-CM | POA: Insufficient documentation

## 2017-02-24 DIAGNOSIS — R197 Diarrhea, unspecified: Secondary | ICD-10-CM | POA: Diagnosis present

## 2017-02-24 LAB — COMPREHENSIVE METABOLIC PANEL
ALK PHOS: 89 U/L (ref 38–126)
ALT: 44 U/L (ref 14–54)
ANION GAP: 9 (ref 5–15)
AST: 33 U/L (ref 15–41)
Albumin: 4.3 g/dL (ref 3.5–5.0)
BILIRUBIN TOTAL: 0.8 mg/dL (ref 0.3–1.2)
BUN: 10 mg/dL (ref 6–20)
CALCIUM: 9.6 mg/dL (ref 8.9–10.3)
CO2: 29 mmol/L (ref 22–32)
CREATININE: 0.74 mg/dL (ref 0.44–1.00)
Chloride: 99 mmol/L — ABNORMAL LOW (ref 101–111)
GFR calc Af Amer: >60 mL/min (ref >60)
Glucose, Bld: 113 mg/dL — ABNORMAL HIGH (ref 65–99)
Potassium: 3.9 mmol/L (ref 3.5–5.1)
SODIUM: 137 mmol/L (ref 135–145)
TOTAL PROTEIN: 7.2 g/dL (ref 6.5–8.1)

## 2017-02-24 LAB — URINALYSIS, ROUTINE W REFLEX MICROSCOPIC
BILIRUBIN URINE: NEGATIVE
Glucose, UA: NEGATIVE mg/dL
HGB URINE DIPSTICK: NEGATIVE
Ketones, ur: 5 mg/dL — AB
Leukocytes, UA: NEGATIVE
NITRITE: NEGATIVE
PROTEIN: NEGATIVE mg/dL
SPECIFIC GRAVITY, URINE: 1.011 (ref 1.005–1.030)
pH: 8 (ref 5.0–8.0)

## 2017-02-24 LAB — POC OCCULT BLOOD, ED: FECAL OCCULT BLD: POSITIVE — AB

## 2017-02-24 LAB — CBC
HCT: 46.9 % — ABNORMAL HIGH (ref 36.0–46.0)
HEMOGLOBIN: 16 g/dL — AB (ref 12.0–15.0)
MCH: 31.3 pg (ref 26.0–34.0)
MCHC: 34.1 g/dL (ref 30.0–36.0)
MCV: 91.8 fL (ref 78.0–100.0)
PLATELETS: 251 10*3/uL (ref 150–400)
RBC: 5.11 MIL/uL (ref 3.87–5.11)
RDW: 12.6 % (ref 11.5–15.5)
WBC: 11.5 10*3/uL — ABNORMAL HIGH (ref 4.0–10.5)

## 2017-02-24 LAB — LIPASE, BLOOD: Lipase: 38 U/L (ref 11–51)

## 2017-02-24 MED ORDER — CIPROFLOXACIN IN D5W 400 MG/200ML IV SOLN
400.0000 mg | Freq: Once | INTRAVENOUS | Status: AC
  Administered 2017-02-24: 400 mg via INTRAVENOUS
  Filled 2017-02-24: qty 200

## 2017-02-24 MED ORDER — METRONIDAZOLE 500 MG PO TABS: 500.0000 mg | ORAL_TABLET | Freq: Two times a day (BID) | ORAL | 0 refills | Status: DC

## 2017-02-24 MED ORDER — SODIUM CHLORIDE 0.9 % IV BOLUS (SEPSIS)
1000.0000 mL | Freq: Once | INTRAVENOUS | Status: AC
  Administered 2017-02-24: 1000 mL via INTRAVENOUS

## 2017-02-24 MED ORDER — CIPROFLOXACIN HCL 500 MG PO TABS: 500.0000 mg | ORAL_TABLET | Freq: Two times a day (BID) | ORAL | 0 refills | Status: DC

## 2017-02-24 MED ORDER — IOPAMIDOL (ISOVUE-300) INJECTION 61%
INTRAVENOUS | Status: AC
  Administered 2017-02-24: 100 mL
  Filled 2017-02-24: qty 100

## 2017-02-24 MED ORDER — METRONIDAZOLE IN NACL 5-0.79 MG/ML-% IV SOLN
500.0000 mg | Freq: Once | INTRAVENOUS | Status: AC
  Administered 2017-02-24: 500 mg via INTRAVENOUS
  Filled 2017-02-24: qty 100

## 2017-02-24 NOTE — Discharge Instructions (Signed)
Return to the ED with any concerns including fainting, difficulty breathing, vomiting and not able to keep down liquids or antibiotics, worsening abdominal pain, decreased level of alertness/lethargy, or any other alarming symptoms

## 2017-02-24 NOTE — ED Notes (Signed)
Patient states that she took her BP medicine this morning (atenolol)

## 2017-02-24 NOTE — ED Notes (Signed)
Patient reports feeling nausea, denies vomiting. Drank ginger ale this AM and was "ok".

## 2017-02-24 NOTE — ED Notes (Signed)
Pt ambulated to bathroom at 14:00. Gait was normal with good balance. Pt back in bed on the monitor and resting.

## 2017-02-24 NOTE — ED Notes (Signed)
Reviewed d/c with patient. No further questions at this time

## 2017-02-24 NOTE — ED Triage Notes (Addendum)
Per Pt, Pt is coming from home with complaints of diarrhea that started last night and then today pt noted some blood in the diarrhea. Intermittent abdominal pain and lower back.

## 2017-02-24 NOTE — ED Provider Notes (Signed)
[facility] DEPARTMENT Provider Note   CSN: [id number] Arrival date & time: 02/24/17  6213     History   Chief Complaint Chief Complaint  Patient presents with  . Diarrhea    HPI Ruth Ward is a 62 y.o. female.  HPI  Patient presenting with complaint of diarrhea.  She states she began to have loose and watery bowel movements last night.  She had multiple bowel movements through the night.  She has been experiencing lower abdominal pain as well.  It is not completely relieved after passing a stool.  This morning she saw blood in her stool.  The blood was bright red.  She has not had any fever.  She has not had any recent travel.  She has no specific sick contacts.  She has not felt faint but is somewhat tired.  Her last colonoscopy was approximately 13 years ago and this was normal.  She does not take any blood thinners.  There are no other associated systemic symptoms, there are no other alleviating or modifying factors.    Past Medical History:  Diagnosis Date  . Allergy   . Climacteric   . Fibrocystic breast   . Hyperlipidemia   . Hypertension   . Vitamin D deficiency     Patient Active Problem List   Diagnosis Date Noted  . Depression, major, recurrent, in partial remission ([place]) 05/02/2014  . Prediabetes 10/09/2013  . Medication management 10/09/2013  . Hypertension   . Hyperlipidemia   . Vitamin D deficiency     Past Surgical History:  Procedure Laterality Date  . ABDOMINAL SURGERY     "tummy tuck"  . [place]  . TUBAL LIGATION      OB History    No data available       Home Medications    Prior to Admission medications   Medication Sig Start Date End Date Taking? Authorizing Provider  aspirin 81 MG tablet Take 81 mg by mouth daily.   Yes [provider]  atenolol (TENORMIN) 25 MG tablet Take 1 tablet (25 mg total) by mouth daily. For BP 01/18/17  Yes Unk Pinto, MD    Cholecalciferol (VITAMIN D) 2000 UNITS tablet Take 2,000 Units by mouth 2 (two) times daily.   Yes [provider]  hydrochlorothiazide (HYDRODIURIL) 25 MG tablet Take 1 tablet (25 mg total) by mouth daily. 01/18/17  Yes Unk Pinto, MD  Multiple Vitamin (MULTIVITAMIN WITH MINERALS) TABS tablet Take 1 tablet by mouth daily.   Yes [provider]  potassium chloride SA (KLOR-CON M20) 20 MEQ tablet Take 1 tablet (20 mEq total) by mouth daily. 02/22/17  Yes Unk Pinto, MD  pravastatin (PRAVACHOL) 40 MG tablet Take 1 tablet (40 mg total) by mouth daily. 01/18/17  Yes Unk Pinto, MD  sertraline (ZOLOFT) 100 MG tablet Take 1 tablet (100 mg total) by mouth daily. 01/18/17 01/18/18 Yes Unk Pinto, MD  ALPRAZolam Duanne Moron) 1 MG tablet Take 1/2 to 1 tablet by mouth 2-3 times daily as needed. Patient not taking: Reported on 02/24/2017 03/22/16   Unk Pinto, MD  ciprofloxacin (CIPRO) 500 MG tablet Take 1 tablet (500 mg total) by mouth every 12 (twelve) hours. 02/24/17   Mabe, Forbes Cellar, MD  metroNIDAZOLE (FLAGYL) 500 MG tablet Take 1 tablet (500 mg total) by mouth 2 (two) times daily. 02/24/17   Mabe, Forbes Cellar, MD    Family History Family History  Problem Relation Age of Onset  .  Diabetes Other   . Hypertension Mother   . Diabetes Mother   . Hypertension Father   . Heart disease Father   . Cancer Father   . Hypertension Brother     Social History Social History   Tobacco Use  . Smoking status: Former Smoker    Last attempt to quit: 02/15/1986    Years since quitting: 31.0  . Smokeless tobacco: Never Used  Substance Use Topics  . Alcohol use: No  . Drug use: Not on file     Allergies   Atorvastatin   Review of Systems Review of Systems  ROS reviewed and all otherwise negative except for mentioned in HPI   Physical Exam Updated Vital Signs BP (!) 147/72   Pulse (!) 58   Temp 98.8 F (37.1 C) (Oral)   Resp 14   Ht 5\' 6"  (1.676 m)   Wt 83.9 kg  (185 lb)   SpO2 97%   BMI 29.86 kg/m  Vitals reviewed Physical Exam  Physical Examination: General appearance - alert, well appearing, and in no distress Mental status - alert, oriented to person, place, and time Eyes - no conjunctival injection, no scleral icterus Mouth - mucous membranes moist, pharynx normal without lesions Neck - supple, no significant adenopathy Chest - clear to auscultation, no wheezes, rales or rhonchi, symmetric air entry Heart - normal rate, regular rhythm, normal S1, S2, no murmurs, rubs, clicks or gallops Abdomen - soft, ttp in left lower abdomen, no gaurding or rebound tenderness, nabs, nondistended, no masses or organomegaly Neurological - alert, oriented, normal speech, no focal findings or movement disorder noted Extremities - peripheral pulses normal, no pedal edema, no clubbing or cyanosis Skin - normal coloration and turgor, no rashes,    ED Treatments / Results  Labs (all labs ordered are listed, but only abnormal results are displayed) Labs Reviewed  COMPREHENSIVE METABOLIC PANEL - Abnormal; Notable for the following components:      Result Value   Chloride 99 (*)    Glucose, Bld 113 (*)    All other components within normal limits  CBC - Abnormal; Notable for the following components:   WBC 11.5 (*)    Hemoglobin 16.0 (*)    HCT 46.9 (*)    All other components within normal limits  URINALYSIS, ROUTINE W REFLEX MICROSCOPIC - Abnormal; Notable for the following components:   Ketones, ur 5 (*)    All other components within normal limits  POC OCCULT BLOOD, ED - Abnormal; Notable for the following components:   Fecal Occult Bld POSITIVE (*)    All other components within normal limits  LIPASE, BLOOD  OCCULT BLOOD X 1 CARD TO LAB, STOOL    EKG  EKG Interpretation None       Radiology Ct Abdomen Pelvis W Contrast  Result Date: 02/24/2017 CLINICAL DATA:  Diarrhea beginning last night with some blood in stool today. Intermittent  abdominal pain and low back pain. EXAM: CT ABDOMEN AND PELVIS WITH CONTRAST TECHNIQUE: Multidetector CT imaging of the abdomen and pelvis was performed using the standard protocol following bolus administration of intravenous contrast. CONTRAST:  170mL ISOVUE-300 IOPAMIDOL (ISOVUE-300) INJECTION 61% COMPARISON:  None. FINDINGS: Lower chest: Lung bases are normal. Hepatobiliary: Mild diffuse low-attenuation of the liver without focal mass. Gallbladder and biliary tree are normal. Pancreas: Normal. Spleen: Normal. Adrenals/Urinary Tract: Adrenal glands are normal. Kidneys are normal in size without nephrolithiasis or focal mass. There is mild prominence of the intrarenal collecting systems right greater  than left. Ureters are normal. Bladder is normal. Stomach/Bowel: Stomach and small bowel are within normal. Appendix is normal. There is moderate wall thickening involving the descending colon with minimal adjacent pericolonic inflammation/fluid compatible with acute colitis. Vascular/Lymphatic: Within normal. Reproductive: Within normal. Other: None. Musculoskeletal: Within normal. IMPRESSION: Evidence of acute colitis involving the descending colon likely of infectious or inflammatory nature. Electronically Signed   By: Marin Olp M.D.   On: 02/24/2017 14:58    Procedures Procedures (including critical care time)  Medications Ordered in ED Medications  ciprofloxacin (CIPRO) IVPB 400 mg (400 mg Intravenous New Bag/Given 02/24/17 1533)  metroNIDAZOLE (FLAGYL) IVPB 500 mg (500 mg Intravenous New Bag/Given 02/24/17 1532)  sodium chloride 0.9 % bolus 1,000 mL (0 mLs Intravenous Stopped 02/24/17 1418)  iopamidol (ISOVUE-300) 61 % injection (100 mLs  Contrast Given 02/24/17 1426)     Initial Impression / Assessment and Plan / ED Course  I have reviewed the triage vital signs and the nursing notes.  Pertinent labs & imaging results that were available during my care of the patient were reviewed by me and  considered in my medical decision making (see chart for details).     Patient presenting with complaint of lower abdominal pain and diarrhea today blood in her stool.  Workup reveals colitis on CT scan.  Patient was started on IV Cipro and Flagyl.  She has no vomiting and will be able to continue treatment as an outpatient to finish her antibiotics.  Patient was updated about findings and plan of care and she is agreeable.  Discharged with strict return precautions.  Pt agreeable with plan.  Final Clinical Impressions(s) / ED Diagnoses   Final diagnoses:  Colitis    ED Discharge Orders        Ordered    ciprofloxacin (CIPRO) 500 MG tablet  Every 12 hours     02/24/17 1537    metroNIDAZOLE (FLAGYL) 500 MG tablet  2 times daily     02/24/17 1537       Mabe, Forbes Cellar, MD 02/24/17 1615

## 2017-02-24 NOTE — ED Notes (Signed)
Patient denies pain and is resting comfortably.  

## 2017-02-24 NOTE — ED Notes (Signed)
Occult card collected by this RN and Tawanna Solo RN

## 2017-02-24 NOTE — ED Notes (Signed)
Pt to CT

## 2017-02-25 ENCOUNTER — Telehealth: Payer: Self-pay

## 2017-02-25 NOTE — Progress Notes (Signed)
FOLLOW UP  Assessment and Plan:   Colon cancer screening -     Ambulatory referral to Gastroenterology  Colitis -     Ambulatory referral to Gastroenterology - patient still with tenderness, will do prolonged course of ABX - liquid diet, bland diet and then slowly advance diet, push fluids - discussed signs/symptoms of obstruction/need to go to ER again - add probiotic - -     metroNIDAZOLE (FLAGYL) 500 MG tablet; Take 1 tablet (500 mg total) by mouth 2 (two) times daily. -     ciprofloxacin (CIPRO) 500 MG tablet; Take 1 tablet (500 mg total) by mouth every 12 (twelve) hours.  Essential hypertension -     CBC with Differential/Platelet -     BASIC METABOLIC PANEL WITH GFR -     Hepatic function panel -     TSH  Hyperlipidemia, unspecified hyperlipidemia type -continue medications, check lipids, decrease fatty foods, increase activity.  -     Lipid panel  Medication management   Continue diet and meds as discussed. Further disposition pending results of labs. Over 30 minutes of exam, counseling, chart review, and critical decision making was performed  Future Appointments  Date Time Provider [place]  10/10/2017  9:00 AM Unk Pinto, MD [facility] None     HPI 62 y.o. female  presents for 3 month follow up on hypertension, cholesterol, prediabetes, and vitamin D deficiency.  Patient was seen in the ER 4 days ago for colitis, started on cipro/flagyl. Last colonoscopy was 2005, Dr. Fuller Plan. She is feeling better, last BM Thursday AM, has been passing gas. She has decreased appetite, no fever,, chills.  Daughter has celiac, patient has tested negative. However her daughter and husband see Dr. Henrene Pastor, she would like to see him     Her blood pressure has been controlled at home, today their BP is BP: 120/82   She does workout. She denies chest pain, shortness of breath, dizziness.   She  is  on cholesterol medication and denies myalgias. Her cholesterol is at goal.  The cholesterol last visit was:   Lab Results  Component Value Date   CHOL 187 09/17/2016   HDL 57 09/17/2016   LDLCALC 86 09/17/2016   TRIG 218 (H) 09/17/2016   CHOLHDL 3.3 09/17/2016   Patient is on Vitamin D supplement.   Lab Results  Component Value Date   VD25OH 66 09/17/2016     BMI is Body mass index is 29.73 kg/m., she is working on diet and exercise. Wt Readings from Last 3 Encounters:  02/28/17 184 lb 3.2 oz (83.6 kg)  02/24/17 185 lb (83.9 kg)  09/27/16 184 lb 6.4 oz (83.6 kg)    Current Medications:  Current Outpatient Medications on File Prior to Visit  Medication Sig  . aspirin 81 MG tablet Take 81 mg by mouth daily.  Marland Kitchen atenolol (TENORMIN) 25 MG tablet Take 1 tablet (25 mg total) by mouth daily. For BP  . Cholecalciferol (VITAMIN D) 2000 UNITS tablet Take 2,000 Units by mouth 2 (two) times daily.  . hydrochlorothiazide (HYDRODIURIL) 25 MG tablet Take 1 tablet (25 mg total) by mouth daily.  . Multiple Vitamin (MULTIVITAMIN WITH MINERALS) TABS tablet Take 1 tablet by mouth daily.  . potassium chloride SA (KLOR-CON M20) 20 MEQ tablet Take 1 tablet (20 mEq total) by mouth daily.  . pravastatin (PRAVACHOL) 40 MG tablet Take 1 tablet (40 mg total) by mouth daily.  . sertraline (ZOLOFT) 100 MG tablet Take 1 tablet (  100 mg total) by mouth daily.  Marland Kitchen ALPRAZolam (XANAX) 1 MG tablet Take 1/2 to 1 tablet by mouth 2-3 times daily as needed. (Patient not taking: Reported on 02/28/2017)   No current facility-administered medications on file prior to visit.     Medical History:  Past Medical History:  Diagnosis Date  . Allergy   . Climacteric   . Fibrocystic breast   . Hyperlipidemia   . Hypertension   . Vitamin D deficiency    Allergies:  Allergies  Allergen Reactions  . Atorvastatin Nausea Only     Review of Systems:  Review of Systems  Constitutional: Negative.   HENT: Negative.   Eyes: Negative.   Respiratory: Negative.   Cardiovascular: Negative.    Gastrointestinal: Positive for abdominal pain and nausea. Negative for blood in stool, constipation, diarrhea, heartburn, melena and vomiting.  Genitourinary: Negative.   Musculoskeletal: Negative.   Skin: Negative.     Family history- Review and unchanged Social history- Review and unchanged Physical Exam: BP 120/82   Pulse 67   Temp 97.6 F (36.4 C)   Resp 14   Ht 5\' 6"  (1.676 m)   Wt 184 lb 3.2 oz (83.6 kg)   SpO2 95%   BMI 29.73 kg/m  Wt Readings from Last 3 Encounters:  02/28/17 184 lb 3.2 oz (83.6 kg)  02/24/17 185 lb (83.9 kg)  09/27/16 184 lb 6.4 oz (83.6 kg)   General Appearance: Well nourished, in no apparent distress. Eyes: PERRLA, EOMs, conjunctiva no swelling or erythema Sinuses: No Frontal/maxillary tenderness ENT/Mouth: Ext aud canals clear, TMs without erythema, bulging. No erythema, swelling, or exudate on post pharynx.  Tonsils not swollen or erythematous. Hearing normal.  Neck: Supple, thyroid normal.  Respiratory: Respiratory effort normal, BS equal bilaterally without rales, rhonchi, wheezing or stridor.  Cardio: RRR with no MRGs. Brisk peripheral pulses without edema.  Abdomen: Soft, + BS normal, diffuse tenderness, worse LLQ, no guarding, rebound, hernias, masses. Lymphatics: Non tender without lymphadenopathy.  Musculoskeletal: Full ROM, 5/5 strength, Normal gait Skin: Warm, dry without rashes, lesions, ecchymosis.  Neuro: Cranial nerves intact. Normal muscle tone, no cerebellar symptoms. Psych: Awake and oriented X 3, normal affect, Insight and Judgment appropriate.    Vicie Mutters, PA-C 9:24 AM [facility]

## 2017-02-25 NOTE — Telephone Encounter (Signed)
Pt called to report that she was seen in the hospital for colon infection & now she has a sinus headache & would like to know if she can take tylenol.  Per provider it is ok for to take tylenol max per day is 3,000mg s  Pt voiced understanding & hung up.

## 2017-02-28 ENCOUNTER — Ambulatory Visit (INDEPENDENT_AMBULATORY_CARE_PROVIDER_SITE_OTHER): Payer: BLUE CROSS/BLUE SHIELD | Admitting: Physician Assistant

## 2017-02-28 ENCOUNTER — Encounter: Payer: Self-pay | Admitting: Physician Assistant

## 2017-02-28 VITALS — BP 120/82 | HR 67 | Temp 97.6°F | Resp 14 | Ht 66.0 in | Wt 184.2 lb

## 2017-02-28 DIAGNOSIS — I10 Essential (primary) hypertension: Secondary | ICD-10-CM | POA: Diagnosis not present

## 2017-02-28 DIAGNOSIS — K529 Noninfective gastroenteritis and colitis, unspecified: Secondary | ICD-10-CM

## 2017-02-28 DIAGNOSIS — Z79899 Other long term (current) drug therapy: Secondary | ICD-10-CM | POA: Diagnosis not present

## 2017-02-28 DIAGNOSIS — Z1211 Encounter for screening for malignant neoplasm of colon: Secondary | ICD-10-CM

## 2017-02-28 DIAGNOSIS — E785 Hyperlipidemia, unspecified: Secondary | ICD-10-CM

## 2017-02-28 LAB — HEPATIC FUNCTION PANEL
AG RATIO: 1.8 (calc) (ref 1.0–2.5)
ALKALINE PHOSPHATASE (APISO): 83 U/L (ref 33–130)
ALT: 30 U/L — AB (ref 6–29)
AST: 26 U/L (ref 10–35)
Albumin: 4.6 g/dL (ref 3.6–5.1)
Bilirubin, Direct: 0.2 mg/dL (ref 0.0–0.2)
Globulin: 2.5 g/dL (calc) (ref 1.9–3.7)
Indirect Bilirubin: 0.3 mg/dL (calc) (ref 0.2–1.2)
TOTAL PROTEIN: 7.1 g/dL (ref 6.1–8.1)
Total Bilirubin: 0.5 mg/dL (ref 0.2–1.2)

## 2017-02-28 LAB — BASIC METABOLIC PANEL WITH GFR
BUN: 12 mg/dL (ref 7–25)
CO2: 31 mmol/L (ref 20–32)
CREATININE: 0.73 mg/dL (ref 0.50–0.99)
Calcium: 10.2 mg/dL (ref 8.6–10.4)
Chloride: 101 mmol/L (ref 98–110)
GFR, Est African American: 102 mL/min/{1.73_m2} (ref >=60)
GFR, Est Non African American: 88 mL/min/{1.73_m2} (ref >=60)
GLUCOSE: 99 mg/dL (ref 65–99)
Potassium: 4.9 mmol/L (ref 3.5–5.3)
SODIUM: 139 mmol/L (ref 135–146)

## 2017-02-28 LAB — TSH: TSH: 2.87 mIU/L (ref 0.40–4.50)

## 2017-02-28 LAB — LIPID PANEL
CHOL/HDL RATIO: 2.8 (calc) (ref <5.0)
Cholesterol: 156 mg/dL (ref <200)
HDL: 55 mg/dL (ref >50)
LDL Cholesterol (Calc): 71 mg/dL (calc)
Non-HDL Cholesterol (Calc): 101 mg/dL (calc) (ref <130)
Triglycerides: 201 mg/dL — ABNORMAL HIGH (ref <150)

## 2017-02-28 LAB — CBC WITH DIFFERENTIAL/PLATELET
BASOS ABS: 40 {cells}/uL (ref 0–200)
BASOS PCT: 0.7 %
EOS PCT: 1.1 %
Eosinophils Absolute: 63 cells/uL (ref 15–500)
HCT: 47.5 % — ABNORMAL HIGH (ref 35.0–45.0)
HEMOGLOBIN: 16.2 g/dL — AB (ref 11.7–15.5)
Lymphs Abs: 2109 cells/uL (ref 850–3900)
MCH: 31.2 pg (ref 27.0–33.0)
MCHC: 34.1 g/dL (ref 32.0–36.0)
MCV: 91.5 fL (ref 80.0–100.0)
MONOS PCT: 7.4 %
MPV: 10.5 fL (ref 7.5–12.5)
NEUTROS ABS: 3067 {cells}/uL (ref 1500–7800)
Neutrophils Relative %: 53.8 %
Platelets: 278 10*3/uL (ref 140–400)
RBC: 5.19 10*6/uL — AB (ref 3.80–5.10)
RDW: 12.9 % (ref 11.0–15.0)
Total Lymphocyte: 37 %
WBC mixed population: 422 cells/uL (ref 200–950)
WBC: 5.7 10*3/uL (ref 3.8–10.8)

## 2017-02-28 MED ORDER — METRONIDAZOLE 500 MG PO TABS: 500.0000 mg | ORAL_TABLET | Freq: Two times a day (BID) | ORAL | 0 refills | Status: DC

## 2017-02-28 MED ORDER — CIPROFLOXACIN HCL 500 MG PO TABS: 500.0000 mg | ORAL_TABLET | Freq: Two times a day (BID) | ORAL | 0 refills | Status: DC

## 2017-02-28 NOTE — Patient Instructions (Signed)
If you stop passing gas for 24-48 hours with worsening  Pain, go to the ER Do another 5 days of antibiotics Get on probiotic Bland foods Push fluids  Small Bowel Obstruction A small bowel obstruction means that something is blocking the small bowel. The small bowel is also called the small intestine. It is the long tube that connects the stomach to the colon. An obstruction will stop food and fluids from passing through the small bowel. Treatment depends on what is causing the problem and how bad the problem is. Follow these instructions at home:  Get a lot of rest.  Follow your diet as told by your doctor. You may need to: ? Only drink clear liquids until you start to get better. ? Avoid solid foods as told by your doctor.  Take over-the-counter and prescription medicines only as told by your doctor.  Keep all follow-up visits as told by your doctor. This is important. Contact a doctor if:  You have a fever.  You have chills. Get help right away if:  You have pain or cramps that get worse.  You throw up (vomit) blood.  You have a feeling of being sick to your stomach (nausea) that does not go away.  You cannot stop throwing up.  You cannot drink fluids.  You feel confused.  You feel dry or thirsty (dehydrated).  Your belly gets more bloated.  You feel weak or you pass out (faint). This information is not intended to replace advice given to you by your health care provider. Make sure you discuss any questions you have with your health care provider. Document Released: 03/11/2004 Document Revised: 09/29/2015 Document Reviewed: 03/28/2014 Elsevier Interactive Patient Education  [employer].

## 2017-03-01 ENCOUNTER — Encounter: Payer: Self-pay | Admitting: Physician Assistant

## 2017-03-01 ENCOUNTER — Encounter: Payer: Self-pay | Admitting: Gastroenterology

## 2017-03-01 DIAGNOSIS — K760 Fatty (change of) liver, not elsewhere classified: Secondary | ICD-10-CM | POA: Insufficient documentation

## 2017-03-18 ENCOUNTER — Telehealth: Payer: Self-pay | Admitting: *Deleted

## 2017-03-18 NOTE — Telephone Encounter (Signed)
Patient is scheduled for recall colon with you on 04/15/17 at [facility]. Her last colon was 2006. She did have recent ED visit on 02/24/2017 for colitis. Ok for patient to have screening colonoscopy at this time or OV needed? Please advise. Thank you,[name]

## 2017-03-21 ENCOUNTER — Ambulatory Visit: Payer: Self-pay | Admitting: Physician Assistant

## 2017-03-21 NOTE — Telephone Encounter (Signed)
OK for direct colonoscopy

## 2017-03-21 NOTE — Telephone Encounter (Signed)
Noted! Thank you

## 2017-03-27 ENCOUNTER — Other Ambulatory Visit: Payer: Self-pay | Admitting: Physician Assistant

## 2017-03-27 DIAGNOSIS — I10 Essential (primary) hypertension: Secondary | ICD-10-CM

## 2017-03-27 DIAGNOSIS — R001 Bradycardia, unspecified: Secondary | ICD-10-CM

## 2017-04-01 ENCOUNTER — Ambulatory Visit (AMBULATORY_SURGERY_CENTER): Payer: Self-pay

## 2017-04-01 VITALS — Ht 66.0 in | Wt 190.8 lb

## 2017-04-01 DIAGNOSIS — Z1211 Encounter for screening for malignant neoplasm of colon: Secondary | ICD-10-CM

## 2017-04-01 MED ORDER — NA SULFATE-K SULFATE-MG SULF 17.5-3.13-1.6 GM/177ML PO SOLN: 1.0000 | Freq: Once | ORAL | 0 refills | Status: AC

## 2017-04-01 NOTE — Progress Notes (Signed)
Per pt, no allergies to soy or egg products.Pt not taking any weight loss meds or using  O2 at home.  Pt refused emmi video. 

## 2017-04-06 ENCOUNTER — Encounter: Payer: Self-pay | Admitting: Gastroenterology

## 2017-04-07 ENCOUNTER — Other Ambulatory Visit: Payer: Self-pay | Admitting: *Deleted

## 2017-04-07 DIAGNOSIS — I10 Essential (primary) hypertension: Secondary | ICD-10-CM

## 2017-04-07 DIAGNOSIS — R001 Bradycardia, unspecified: Secondary | ICD-10-CM

## 2017-04-07 MED ORDER — ATENOLOL 25 MG PO TABS: 25.0000 mg | ORAL_TABLET | Freq: Every day | ORAL | 0 refills | Status: DC

## 2017-04-15 ENCOUNTER — Other Ambulatory Visit: Payer: Self-pay

## 2017-04-15 ENCOUNTER — Encounter: Payer: Self-pay | Admitting: Gastroenterology

## 2017-04-15 ENCOUNTER — Ambulatory Visit (AMBULATORY_SURGERY_CENTER): Payer: BLUE CROSS/BLUE SHIELD | Admitting: Gastroenterology

## 2017-04-15 VITALS — BP 150/70 | HR 59 | Temp 98.6°F | Resp 21 | Ht 66.0 in | Wt 190.0 lb

## 2017-04-15 DIAGNOSIS — D120 Benign neoplasm of cecum: Secondary | ICD-10-CM

## 2017-04-15 DIAGNOSIS — Z1211 Encounter for screening for malignant neoplasm of colon: Secondary | ICD-10-CM | POA: Diagnosis not present

## 2017-04-15 DIAGNOSIS — D122 Benign neoplasm of ascending colon: Secondary | ICD-10-CM | POA: Diagnosis not present

## 2017-04-15 DIAGNOSIS — D123 Benign neoplasm of transverse colon: Secondary | ICD-10-CM | POA: Diagnosis not present

## 2017-04-15 MED ORDER — SODIUM CHLORIDE 0.9 % IV SOLN: 500.0000 mL | Freq: Once | INTRAVENOUS | Status: DC

## 2017-04-15 NOTE — Progress Notes (Signed)
Called to room to assist during endoscopic procedure.  Patient ID and intended procedure confirmed with present staff. Received instructions for my participation in the procedure from the performing physician.  

## 2017-04-15 NOTE — Patient Instructions (Signed)
YOU HAD AN ENDOSCOPIC PROCEDURE TODAY AT THE [facility]:   Refer to the procedure report that was given to you for any specific questions about what was found during the examination.  If the procedure report does not answer your questions, please call your gastroenterologist to clarify.  If you requested that your care partner not be given the details of your procedure findings, then the procedure report has been included in a sealed envelope for you to review at your convenience later.  YOU SHOULD EXPECT: Some feelings of bloating in the abdomen. Passage of more gas than usual.  Walking can help get rid of the air that was put into your GI tract during the procedure and reduce the bloating. If you had a lower endoscopy (such as a colonoscopy or flexible sigmoidoscopy) you may notice spotting of blood in your stool or on the toilet paper. If you underwent a bowel prep for your procedure, you may not have a normal bowel movement for a few days.  Please Note:  You might notice some irritation and congestion in your nose or some drainage.  This is from the oxygen used during your procedure.  There is no need for concern and it should clear up in a day or so.  SYMPTOMS TO REPORT IMMEDIATELY:   Following lower endoscopy (colonoscopy or flexible sigmoidoscopy):  Excessive amounts of blood in the stool  Significant tenderness or worsening of abdominal pains  Swelling of the abdomen that is new, acute  Fever of 100F or higher   For urgent or emergent issues, a gastroenterologist can be reached at any hour by calling [phone].   DIET:  We do recommend a small meal at first, but then you may proceed to your regular diet.  Drink plenty of fluids but you should avoid alcoholic beverages for 24 hours.  ACTIVITY:  You should plan to take it easy for the rest of today and you should NOT DRIVE or use heavy machinery until tomorrow (because of the sedation medicines used during the test).     FOLLOW UP: Our staff will call the number listed on your records the next business day following your procedure to check on you and address any questions or concerns that you may have regarding the information given to you following your procedure. If we do not reach you, we will leave a message.  However, if you are feeling well and you are not experiencing any problems, there is no need to return our call.  We will assume that you have returned to your regular daily activities without incident.  If any biopsies were taken you will be contacted by phone or by letter within the next 1-3 weeks.  Please call us at [phone] if you have not heard about the biopsies in 3 weeks.    SIGNATURES/CONFIDENTIALITY: You and/or your care partner have signed paperwork which will be entered into your electronic medical record.  These signatures attest to the fact that that the information above on your After Visit Summary has been reviewed and is understood.  Full responsibility of the confidentiality of this discharge information lies with you and/or your care-partner.  Polyps and hemorrhoid information given. 

## 2017-04-15 NOTE — Progress Notes (Signed)
Pt's states no medical or surgical changes since previsit or office visit. 

## 2017-04-15 NOTE — Progress Notes (Signed)
Report to PACU, RN, vss, BBS= Clear.  

## 2017-04-15 NOTE — Op Note (Signed)
[facility] Patient Name: Ruth Ward Procedure Date: 04/15/2017 8:29 AM MRN: [id number] Endoscopist: Ladene Artist , MD Age: 62 Referring MD:  Date of Birth: December 08, 1955 Gender: Female Account #: [id number] Procedure:                Colonoscopy Indications:              Screening for colorectal malignant neoplasm Medicines:                Monitored Anesthesia Care Procedure:                Pre-Anesthesia Assessment:                           - Prior to the procedure, a History and Physical                            was performed, and patient medications and                            allergies were reviewed. The patient's tolerance of                            previous anesthesia was also reviewed. The risks                            and benefits of the procedure and the sedation                            options and risks were discussed with the patient.                            All questions were answered, and informed consent                            was obtained. Prior Anticoagulants: The patient has                            taken no previous anticoagulant or antiplatelet                            agents. ASA Grade Assessment: II - A patient with                            mild systemic disease. After reviewing the risks                            and benefits, the patient was deemed in                            satisfactory condition to undergo the procedure.                           After obtaining informed consent, the colonoscope  was passed under direct vision. Throughout the                            procedure, the patient's blood pressure, pulse, and                            oxygen saturations were monitored continuously. The                            Model PCF-[id number]) scope was introduced                            through the anus and advanced to the the cecum,                            identified by  appendiceal orifice and ileocecal                            valve. The ileocecal valve, appendiceal orifice,                            and rectum were photographed. The quality of the                            bowel preparation was good. The colonoscopy was                            performed without difficulty. The patient tolerated                            the procedure well. Scope In: 8:39:27 AM Scope Out: 9:00:51 AM Scope Withdrawal Time: 0 hours 14 minutes 9 seconds  Total Procedure Duration: 0 hours 21 minutes 24 seconds  Findings:                 The perianal and digital rectal examinations were                            normal.                           A 7 mm polyp was found in the transverse colon. The                            polyp was sessile. The polyp was removed with a                            cold snare. Resection and retrieval were complete.                           Two sessile polyps were found in the hepatic                            flexure and cecum. The polyps were 3 to 4 mm  in                            size. These polyps were removed with a cold biopsy                            forceps. Resection and retrieval were complete.                           Internal hemorrhoids were found during                            retroflexion. The hemorrhoids were medium-sized and                            Grade I (internal hemorrhoids that do not prolapse).                           The exam was otherwise without abnormality on                            direct and retroflexion views. Complications:            No immediate complications. Estimated blood loss:                            None. Estimated Blood Loss:     Estimated blood loss: none. Impression:               - One 7 mm polyp in the transverse colon, removed                            with a cold snare. Resected and retrieved.                           - Two 3 to 4 mm polyps at the hepatic flexure and                             in the cecum, removed with a cold biopsy forceps.                            Resected and retrieved.                           - Internal hemorrhoids.                           - The examination was otherwise normal on direct                            and retroflexion views. Recommendation:           - Repeat colonoscopy in 3 - 5 years for                            surveillance if polyp(s) are precancerous,  otherwise 10 years.                           - Patient has a contact number available for                            emergencies. The signs and symptoms of potential                            delayed complications were discussed with the                            patient. Return to normal activities tomorrow.                            Written discharge instructions were provided to the                            patient.                           - Resume previous diet.                           - Continue present medications.                           - Await pathology results. Ladene Artist, MD 04/15/2017 9:03:18 AM This report has been signed electronically.

## 2017-04-18 ENCOUNTER — Telehealth: Payer: Self-pay | Admitting: *Deleted

## 2017-04-18 NOTE — Telephone Encounter (Signed)
Message left

## 2017-04-30 ENCOUNTER — Encounter: Payer: Self-pay | Admitting: Gastroenterology

## 2017-05-12 ENCOUNTER — Other Ambulatory Visit: Payer: Self-pay | Admitting: Internal Medicine

## 2017-05-12 MED ORDER — SERTRALINE HCL 100 MG PO TABS: 100.0000 mg | ORAL_TABLET | Freq: Every day | ORAL | 1 refills | Status: DC

## 2017-08-09 ENCOUNTER — Other Ambulatory Visit: Payer: Self-pay

## 2017-08-09 MED ORDER — HYDROCHLOROTHIAZIDE 25 MG PO TABS: 25.0000 mg | ORAL_TABLET | Freq: Every day | ORAL | 1 refills | Status: DC

## 2017-08-09 NOTE — Telephone Encounter (Signed)
Refill request for HCTZ 

## 2017-09-23 ENCOUNTER — Encounter: Payer: Self-pay | Admitting: Physician Assistant

## 2017-10-10 ENCOUNTER — Ambulatory Visit (INDEPENDENT_AMBULATORY_CARE_PROVIDER_SITE_OTHER): Payer: BLUE CROSS/BLUE SHIELD | Admitting: Internal Medicine

## 2017-10-10 ENCOUNTER — Encounter: Payer: Self-pay | Admitting: Internal Medicine

## 2017-10-10 VITALS — BP 116/80 | HR 68 | Temp 97.3°F | Resp 16 | Ht 66.5 in | Wt 189.8 lb

## 2017-10-10 DIAGNOSIS — Z79899 Other long term (current) drug therapy: Secondary | ICD-10-CM | POA: Diagnosis not present

## 2017-10-10 DIAGNOSIS — Z1389 Encounter for screening for other disorder: Secondary | ICD-10-CM | POA: Diagnosis not present

## 2017-10-10 DIAGNOSIS — E559 Vitamin D deficiency, unspecified: Secondary | ICD-10-CM | POA: Diagnosis not present

## 2017-10-10 DIAGNOSIS — R5383 Other fatigue: Secondary | ICD-10-CM

## 2017-10-10 DIAGNOSIS — Z13220 Encounter for screening for lipoid disorders: Secondary | ICD-10-CM | POA: Diagnosis not present

## 2017-10-10 DIAGNOSIS — Z1212 Encounter for screening for malignant neoplasm of rectum: Secondary | ICD-10-CM

## 2017-10-10 DIAGNOSIS — Z0000 Encounter for general adult medical examination without abnormal findings: Secondary | ICD-10-CM

## 2017-10-10 DIAGNOSIS — Z1329 Encounter for screening for other suspected endocrine disorder: Secondary | ICD-10-CM

## 2017-10-10 DIAGNOSIS — Z131 Encounter for screening for diabetes mellitus: Secondary | ICD-10-CM

## 2017-10-10 DIAGNOSIS — R7303 Prediabetes: Secondary | ICD-10-CM

## 2017-10-10 DIAGNOSIS — Z1211 Encounter for screening for malignant neoplasm of colon: Secondary | ICD-10-CM

## 2017-10-10 DIAGNOSIS — Z111 Encounter for screening for respiratory tuberculosis: Secondary | ICD-10-CM | POA: Diagnosis not present

## 2017-10-10 DIAGNOSIS — E782 Mixed hyperlipidemia: Secondary | ICD-10-CM

## 2017-10-10 DIAGNOSIS — R7309 Other abnormal glucose: Secondary | ICD-10-CM

## 2017-10-10 DIAGNOSIS — Z0001 Encounter for general adult medical examination with abnormal findings: Secondary | ICD-10-CM

## 2017-10-10 DIAGNOSIS — I10 Essential (primary) hypertension: Secondary | ICD-10-CM

## 2017-10-10 DIAGNOSIS — Z87891 Personal history of nicotine dependence: Secondary | ICD-10-CM

## 2017-10-10 DIAGNOSIS — Z8249 Family history of ischemic heart disease and other diseases of the circulatory system: Secondary | ICD-10-CM

## 2017-10-10 DIAGNOSIS — Z136 Encounter for screening for cardiovascular disorders: Secondary | ICD-10-CM

## 2017-10-10 NOTE — Progress Notes (Signed)
ADULT & ADOLESCENT INTERNAL MEDICINE Unk Pinto, M.D.     Uvaldo Bristle. Silverio Lay, P.A.-C Liane Comber, [place] [address] [place], [place] [address] Telephone [phone] Telefax [phone] Annual Screening/Preventative Visit & Comprehensive Evaluation &  Examination     This very nice 62 y.o. MWF presents for a Screening /Preventative Visit & comprehensive evaluation and management of multiple medical co-morbidities.  Patient has been followed expectantly  for HTN, HLD, Prediabetes screening and Vitamin D Deficiency.      HTN predates circa 2000. Patient's BP has been controlled at home and patient denies any cardiac symptoms as chest pain, palpitations, shortness of breath, dizziness or ankle swelling. Today's BP is at goal - 116/80.      Patient's hyperlipidemia is controlled with diet and medications. Patient denies myalgias or other medication SE's. Last lipids were at goal albeit sl elevated Trig's: Lab Results  Component Value Date   CHOL 156 02/28/2017   HDL 55 02/28/2017   LDLCALC 71 02/28/2017   TRIG 201 (H) 02/28/2017   CHOLHDL 2.8 02/28/2017      Patient has obesity (BMI 30+) and is screened expectantly for  prediabetes and patient denies reactive hypoglycemic symptoms, visual blurring, diabetic polys, or paresthesias. Last A1c was Normal & at goal: Lab Results  Component Value Date   HGBA1C 5.4 05/22/2015      Finally, patient has history of Vitamin D Deficiency ("43"/on Tx /2008) and last Vitamin D was at goal: Lab Results  Component Value Date   VD25OH 66 09/17/2016   Current Outpatient Medications on File Prior to Visit  Medication Sig  . ALPRAZolam 1 MG tablet Take 1/2 to 1 tablet  2-3 times daily as needed.  Marland Kitchen aspirin 81 MG  Take  daily.  Marland Kitchen atenolol  25 MG Take 1 tablet ( daily. For BP  . VITAMIN D 2000 UNITS Take  daily.   . hydrochlorothiazide 25 MG  Take 1 tablet  daily.  . Multiple Vitamin w/Min  Take 1 tabletdaily.  . potassium chloride  20 MEQ  Take 1 tablet daily.  . pravastatin 40 MG tablet Take 1 tablet  daily.  . sertraline 100 MG tablet Take 1 tablet daily.   Allergies  Allergen Reactions  . Atorvastatin Nausea Only   Past Medical History:  Diagnosis Date  . Allergy   . Climacteric   . Fibrocystic breast   . Hyperlipidemia   . Hypertension   . Vitamin D deficiency    Health Maintenance  Topic Date Due  . MAMMOGRAM  08/12/2016  . INFLUENZA VACCINE  09/15/2017  . PAP SMEAR  12/02/2018  . COLONOSCOPY  04/15/2020  . TETANUS/TDAP  03/02/2022  . Hepatitis C Screening  Completed  . HIV Screening  Completed   Immunization History  Administered Date(s) Administered  . DTaP 03/02/2012  . Influenza-Unspecified 11/15/2012  . PPD Test 03/07/2013, 05/02/2014, 05/22/2015  . Pneumococcal-Unspecified 02/15/2009  . Td 02/15/2002  . Zoster 12/02/2015   Last Colon - 04/15/2017 - Dr Fuller Plan recc 3 yr f/u due Mar 2022  Last MGM - 2018 ay wok at [facility] to schedule [facility]   Past Surgical History:  Procedure Laterality Date  . ABDOMINAL SURGERY  09/2016   "tummy tuck"  . [place]  . TUBAL LIGATION     Family History  Problem Relation Age of Onset  . Diabetes Other   . Hypertension Mother   .  Diabetes Mother   . Hypertension Father   . Heart disease Father   . Cancer Father   . Prostate cancer Father   . Hypertension Brother   . Diabetes Brother   . Diabetes Brother   . Colon cancer Neg Hx    Social History   Tobacco Use  . Smoking status: Former Smoker    Last attempt to quit: 02/15/1986    Years since quitting: 31.6  . Smokeless tobacco: Never Used  Substance Use Topics  . Alcohol use: No  . Drug use: No    ROS Constitutional: Denies fever, chills, weight loss/gain, headaches, insomnia,  night sweats, and change in appetite. Does c/o fatigue. Eyes: Denies redness, blurred vision, diplopia, discharge, itchy,  watery eyes.  ENT: Denies discharge, congestion, post nasal drip, epistaxis, sore throat, earache, hearing loss, dental pain, Tinnitus, Vertigo, Sinus pain, snoring.  Cardio: Denies chest pain, palpitations, irregular heartbeat, syncope, dyspnea, diaphoresis, orthopnea, PND, claudication, edema Respiratory: denies cough, dyspnea, DOE, pleurisy, hoarseness, laryngitis, wheezing.  Gastrointestinal: Denies dysphagia, heartburn, reflux, water brash, pain, cramps, nausea, vomiting, bloating, diarrhea, constipation, hematemesis, melena, hematochezia, jaundice, hemorrhoids Genitourinary: Denies dysuria, frequency, urgency, nocturia, hesitancy, discharge, hematuria, flank pain Breast: Breast lumps, nipple discharge, bleeding.  Musculoskeletal: Denies arthralgia, myalgia, stiffness, Jt. Swelling, pain, limp, and strain/sprain. Denies falls. Skin: Denies puritis, rash, hives, warts, acne, eczema, changing in skin lesion Neuro: No weakness, tremor, incoordination, spasms, paresthesia, pain Psychiatric: Denies confusion, memory loss, sensory loss. Denies Depression. Endocrine: Denies change in weight, skin, hair change, nocturia, and paresthesia, diabetic polys, visual blurring, hyper / hypo glycemic episodes.  Heme/Lymph: No excessive bleeding, bruising, enlarged lymph nodes.  Physical Exam  BP 116/80   Pulse 68   Temp (!) 97.3 F (36.3 C)   Resp 16   Ht 5' 6.5" (1.689 m)   Wt 189 lb 12.8 oz (86.1 kg)   BMI 30.18 kg/m   General Appearance: Well nourished, well groomed and in no apparent distress.  Eyes: PERRLA, EOMs, conjunctiva no swelling or erythema, normal fundi and vessels. Sinuses: No frontal/maxillary tenderness ENT/Mouth: EACs patent / TMs  nl. Nares clear without erythema, swelling, mucoid exudates. Oral hygiene is good. No erythema, swelling, or exudate. Tongue normal, non-obstructing. Tonsils not swollen or erythematous. Hearing normal.  Neck: Supple, thyroid not palpable. No bruits,  nodes or JVD. Respiratory: Respiratory effort normal.  BS equal and clear bilateral without rales, rhonci, wheezing or stridor. Cardio: Heart sounds are normal with regular rate and rhythm and no murmurs, rubs or gallops. Peripheral pulses are normal and equal bilaterally without edema. No aortic or femoral bruits. Chest: symmetric with normal excursions and percussion. Breasts: Symmetric, without lumps, nipple discharge, retractions, or fibrocystic changes.  Abdomen: Flat, soft with bowel sounds active. Nontender, no guarding, rebound, hernias, masses, or organomegaly.  Lymphatics: Non tender without lymphadenopathy.  Musculoskeletal: Full ROM all peripheral extremities, joint stability, 5/5 strength, and normal gait. Skin: Warm and dry without rashes, lesions, cyanosis, clubbing or  ecchymosis.  Neuro: Cranial nerves intact, reflexes equal bilaterally. Normal muscle tone, no cerebellar symptoms. Sensation intact.  Pysch: Alert and oriented X 3, normal affect, Insight and Judgment appropriate.   Assessment and Plan  1. Annual Preventative Screening Examination  2. Essential hypertension  - EKG 12-Lead - [place], RETROPERITNL ABD,  LTD - Urinalysis, Routine w reflex microscopic - Microalbumin / creatinine urine ratio - CBC with Differential/Platelet - COMPLETE METABOLIC PANEL WITH GFR - Magnesium - TSH  3. Hyperlipidemia, mixed  - EKG 12-Lead -  [place], RETROPERITNL ABD,  LTD - Lipid panel  4. Abnormal glucose  - EKG 12-Lead - [place], RETROPERITNL ABD,  LTD - Hemoglobin A1c - Insulin, random  5. Vitamin D deficiency  - VITAMIN D 25 Hydroxyl  6. Screening for colorectal cancer  - POC Hemoccult Bld/Stl  7. Screening for ischemic heart disease  - EKG 12-Lead  8. FHx: heart disease  - EKG 12-Lead - [place], RETROPERITNL ABD,  LTD  9. Former smoker  - EKG 12-Lead - [place], RETROPERITNL ABD,  LTD  10. Screening for AAA (aortic abdominal aneurysm)  - [place], RETROPERITNL ABD,   LTD  11. Fatigue  - Iron,Total/Total Iron Binding Cap - Vitamin B12 - CBC with Differential/Platelet - TSH  12. Medication management  - Urinalysis, Routine w reflex microscopic - Microalbumin / creatinine urine ratio - CBC with Differential/Platelet - COMPLETE METABOLIC PANEL WITH GFR - Magnesium - Lipid panel - TSH - Hemoglobin A1c - Insulin, random - VITAMIN D 25 Hydroxyl  13. Screening examination for pulmonary tuberculosis  - PPD     Patient was counseled in prudent diet to achieve/maintain BMI less than 25 for weight control, BP monitoring, regular exercise and medications. Discussed med's effects and SE's. Screening labs and tests as requested with regular follow-up as recommended. Over 40 minutes of exam, counseling, chart review and high complex critical decision making was performed.

## 2017-10-10 NOTE — Patient Instructions (Signed)

## 2017-10-11 LAB — URINALYSIS, ROUTINE W REFLEX MICROSCOPIC
BILIRUBIN URINE: NEGATIVE
Glucose, UA: NEGATIVE
Hgb urine dipstick: NEGATIVE
Ketones, ur: NEGATIVE
LEUKOCYTES UA: NEGATIVE
NITRITE: NEGATIVE
PROTEIN: NEGATIVE
Specific Gravity, Urine: 1.013 (ref 1.001–1.03)
pH: 8 (ref 5.0–8.0)

## 2017-10-11 LAB — CBC WITH DIFFERENTIAL/PLATELET
Basophils Absolute: 39 cells/uL (ref 0–200)
Basophils Relative: 0.8 %
EOS PCT: 1 %
Eosinophils Absolute: 49 cells/uL (ref 15–500)
HEMATOCRIT: 44.2 % (ref 35.0–45.0)
HEMOGLOBIN: 15.3 g/dL (ref 11.7–15.5)
LYMPHS ABS: 1744 {cells}/uL (ref 850–3900)
MCH: 32.6 pg (ref 27.0–33.0)
MCHC: 34.6 g/dL (ref 32.0–36.0)
MCV: 94 fL (ref 80.0–100.0)
MONOS PCT: 6.7 %
MPV: 10.6 fL (ref 7.5–12.5)
NEUTROS ABS: 2739 {cells}/uL (ref 1500–7800)
Neutrophils Relative %: 55.9 %
Platelets: 255 10*3/uL (ref 140–400)
RBC: 4.7 10*6/uL (ref 3.80–5.10)
RDW: 12 % (ref 11.0–15.0)
Total Lymphocyte: 35.6 %
WBC mixed population: 328 cells/uL (ref 200–950)
WBC: 4.9 10*3/uL (ref 3.8–10.8)

## 2017-10-11 LAB — LIPID PANEL
CHOL/HDL RATIO: 3.2 (calc) (ref <5.0)
Cholesterol: 189 mg/dL (ref <200)
HDL: 59 mg/dL (ref >50)
LDL Cholesterol (Calc): 98 mg/dL (calc)
NON-HDL CHOLESTEROL (CALC): 130 mg/dL — AB (ref <130)
Triglycerides: 204 mg/dL — ABNORMAL HIGH (ref <150)

## 2017-10-11 LAB — COMPLETE METABOLIC PANEL WITH GFR
AG RATIO: 1.7 (calc) (ref 1.0–2.5)
ALT: 48 U/L — ABNORMAL HIGH (ref 6–29)
AST: 31 U/L (ref 10–35)
Albumin: 4.4 g/dL (ref 3.6–5.1)
Alkaline phosphatase (APISO): 94 U/L (ref 33–130)
BUN: 18 mg/dL (ref 7–25)
CALCIUM: 9.8 mg/dL (ref 8.6–10.4)
CO2: 33 mmol/L — ABNORMAL HIGH (ref 20–32)
Chloride: 102 mmol/L (ref 98–110)
Creat: 0.81 mg/dL (ref 0.50–0.99)
GFR, EST AFRICAN AMERICAN: 90 mL/min/{1.73_m2} (ref >=60)
GFR, Est Non African American: 77 mL/min/{1.73_m2} (ref >=60)
GLOBULIN: 2.6 g/dL (ref 1.9–3.7)
Glucose, Bld: 95 mg/dL (ref 65–99)
POTASSIUM: 4.4 mmol/L (ref 3.5–5.3)
SODIUM: 141 mmol/L (ref 135–146)
TOTAL PROTEIN: 7 g/dL (ref 6.1–8.1)
Total Bilirubin: 0.5 mg/dL (ref 0.2–1.2)

## 2017-10-11 LAB — VITAMIN D 25 HYDROXY (VIT D DEFICIENCY, FRACTURES): Vit D, 25-Hydroxy: 109 ng/mL — ABNORMAL HIGH (ref 30–100)

## 2017-10-11 LAB — INSULIN, RANDOM: Insulin: 11.2 u[IU]/mL (ref 2.0–19.6)

## 2017-10-11 LAB — IRON, TOTAL/TOTAL IRON BINDING CAP
%SAT: 44 % (calc) (ref 16–45)
IRON: 164 ug/dL — AB (ref 45–160)
TIBC: 374 mcg/dL (calc) (ref 250–450)

## 2017-10-11 LAB — HEMOGLOBIN A1C
HEMOGLOBIN A1C: 5.5 %{Hb} (ref <5.7)
MEAN PLASMA GLUCOSE: 111 (calc)
eAG (mmol/L): 6.2 (calc)

## 2017-10-11 LAB — MICROALBUMIN / CREATININE URINE RATIO
CREATININE, URINE: 47 mg/dL (ref 20–275)
MICROALB UR: 0.3 mg/dL
MICROALB/CREAT RATIO: 6 ug/mg{creat} (ref <30)

## 2017-10-11 LAB — TSH: TSH: 1.95 m[IU]/L (ref 0.40–4.50)

## 2017-10-11 LAB — VITAMIN B12: Vitamin B-12: 514 pg/mL (ref 200–1100)

## 2017-10-11 LAB — MAGNESIUM: Magnesium: 2.3 mg/dL (ref 1.5–2.5)

## 2017-10-12 ENCOUNTER — Other Ambulatory Visit: Payer: Self-pay | Admitting: Internal Medicine

## 2017-10-12 ENCOUNTER — Other Ambulatory Visit: Payer: Self-pay

## 2017-10-12 DIAGNOSIS — I10 Essential (primary) hypertension: Secondary | ICD-10-CM

## 2017-10-12 DIAGNOSIS — R001 Bradycardia, unspecified: Secondary | ICD-10-CM

## 2017-10-12 MED ORDER — POTASSIUM CHLORIDE CRYS ER 20 MEQ PO TBCR: EXTENDED_RELEASE_TABLET | ORAL | 3 refills | Status: DC

## 2017-10-12 MED ORDER — ATENOLOL 25 MG PO TABS: 25.0000 mg | ORAL_TABLET | Freq: Every day | ORAL | 1 refills | Status: DC

## 2017-10-12 MED ORDER — HYDROCHLOROTHIAZIDE 25 MG PO TABS: 25.0000 mg | ORAL_TABLET | Freq: Every day | ORAL | 1 refills | Status: DC

## 2017-10-13 ENCOUNTER — Other Ambulatory Visit: Payer: Self-pay | Admitting: Internal Medicine

## 2017-10-13 MED ORDER — VITAMIN D 50 MCG (2000 UT) PO TABS: ORAL_TABLET | ORAL | Status: DC

## 2017-10-14 ENCOUNTER — Other Ambulatory Visit: Payer: Self-pay

## 2017-10-14 DIAGNOSIS — R001 Bradycardia, unspecified: Secondary | ICD-10-CM

## 2017-10-14 DIAGNOSIS — I10 Essential (primary) hypertension: Secondary | ICD-10-CM

## 2017-10-14 LAB — TB SKIN TEST
Induration: 0 mm
TB SKIN TEST: NEGATIVE

## 2017-10-14 MED ORDER — ATENOLOL 25 MG PO TABS: 25.0000 mg | ORAL_TABLET | Freq: Every day | ORAL | 1 refills | Status: DC

## 2017-10-19 ENCOUNTER — Encounter: Payer: Self-pay | Admitting: *Deleted

## 2017-11-04 ENCOUNTER — Other Ambulatory Visit: Payer: Self-pay | Admitting: Physician Assistant

## 2017-12-30 ENCOUNTER — Other Ambulatory Visit: Payer: Self-pay | Admitting: Internal Medicine

## 2018-01-16 DIAGNOSIS — Z6830 Body mass index (BMI) 30.0-30.9, adult: Secondary | ICD-10-CM | POA: Insufficient documentation

## 2018-01-16 DIAGNOSIS — Z6824 Body mass index (BMI) 24.0-24.9, adult: Secondary | ICD-10-CM | POA: Insufficient documentation

## 2018-01-16 NOTE — Progress Notes (Signed)
FOLLOW UP  Assessment and Plan:   Hypertension Well controlled with current medications  Monitor blood pressure at home; patient to call if consistently greater than 130/80 Continue DASH diet.   Reminder to go to the ER if any CP, SOB, nausea, dizziness, severe HA, changes vision/speech, left arm numbness and tingling and jaw pain.  Cholesterol Currently at goal; continue pravastatin Continue low cholesterol diet and exercise.  Check lipid panel.   Other abnormal glucose Recent A1Cs at goal Discussed diet/exercise, weight management  Weight gain, losing insurance, wants to check A1C   Obesity with co morbidities Long discussion about weight loss, diet, and exercise Recommended diet heavy in fruits and veggies and low in animal meats, cheeses, and dairy products, appropriate calorie intake Discussed ideal weight for height and initial weight goal (185lb) Patient will work on starting exercise, watching portions Will follow up in 3 months  Vitamin D Def Above goal at last visit; continue supplementation to maintain goal of 70-100 Check Vit D level  Continue diet and meds as discussed. Further disposition pending results of labs. Discussed med's effects and SE's.   Over 30 minutes of exam, counseling, chart review, and critical decision making was performed.   Future Appointments  Date Time Provider [place]  04/21/2018  9:30 AM Unk Pinto, MD [facility] None  10/30/2018  9:00 AM Unk Pinto, MD [facility] None    ----------------------------------------------------------------------------------------------------------------------  HPI 62 y.o. female  presents for 3 month follow up on hypertension, cholesterol, glucose manageement, weight, depression/anxiety and vitamin D deficiency.   she has a diagnosis of depression/anxiety and is currently on zoloft 100 mg daily, reports symptoms are well controlled on current regimen.   BMI is Body mass index is  30.88 kg/m., she has not been working on diet and exercise, but plans to start exercising now that she is retired.  Wt Readings from Last 3 Encounters:  01/18/18 194 lb 3.2 oz (88.1 kg)  10/10/17 189 lb 12.8 oz (86.1 kg)  04/15/17 190 lb (86.2 kg)   Her blood pressure has been controlled at home, today their BP is BP: 130/82  She does not workout. She denies chest pain, shortness of breath, dizziness.   She is on cholesterol medication Pravastatin and denies myalgias. Her LDL cholesterol is at goal. The cholesterol last visit was:   Lab Results  Component Value Date   CHOL 189 10/10/2017   HDL 59 10/10/2017   LDLCALC 98 10/10/2017   TRIG 204 (H) 10/10/2017   CHOLHDL 3.2 10/10/2017    She has not been working on diet and exercise for glucose management, and denies increased appetite, nausea, paresthesia of the feet, polydipsia, polyuria and visual disturbances. Last A1C in the office was:  Lab Results  Component Value Date   HGBA1C 5.5 10/10/2017   Patient is on Vitamin D supplement.   Lab Results  Component Value Date   VD25OH 109 (H) 10/10/2017       Current Medications:  Current Outpatient Medications on File Prior to Visit  Medication Sig  . aspirin 81 MG tablet Take 81 mg by mouth daily.  Marland Kitchen atenolol (TENORMIN) 25 MG tablet Take 1 tablet (25 mg total) by mouth daily. For BP  . Cholecalciferol (VITAMIN D) 2000 units tablet Take 1 tablet 2 x (10,000 u)  /day  . hydrochlorothiazide (HYDRODIURIL) 25 MG tablet Take 1 tablet (25 mg total) by mouth daily.  Marland Kitchen KLOR-CON M20 20 MEQ tablet TAKE 1 TABLET BY MOUTH EVERY DAY  .  Multiple Vitamin (MULTIVITAMIN WITH MINERALS) TABS tablet Take 1 tablet by mouth daily.  . potassium chloride SA (K-DUR,KLOR-CON) 20 MEQ tablet Take 1 tablet 2 x /day  . pravastatin (PRAVACHOL) 40 MG tablet Take 1 tablet (40 mg total) by mouth daily.  . sertraline (ZOLOFT) 100 MG tablet TAKE 1 TABLET DAILY   No current facility-administered medications on file  prior to visit.      Allergies:  Allergies  Allergen Reactions  . Atorvastatin Nausea Only     Medical History:  Past Medical History:  Diagnosis Date  . Allergy   . Climacteric   . Fibrocystic breast   . Hyperlipidemia   . Hypertension   . Vitamin D deficiency    Family history- Reviewed and unchanged Social history- Reviewed and unchanged   Review of Systems:  Review of Systems  Constitutional: Negative for malaise/fatigue and weight loss.  HENT: Negative for hearing loss and tinnitus.   Eyes: Negative for blurred vision and double vision.  Respiratory: Negative for cough, shortness of breath and wheezing.   Cardiovascular: Negative for chest pain, palpitations, orthopnea, claudication and leg swelling.  Gastrointestinal: Negative for abdominal pain, blood in stool, constipation, diarrhea, heartburn, melena, nausea and vomiting.  Genitourinary: Negative.   Musculoskeletal: Negative for joint pain and myalgias.  Skin: Negative for rash.  Neurological: Negative for dizziness, tingling, sensory change, weakness and headaches.  Endo/Heme/Allergies: Negative for polydipsia.  Psychiatric/Behavioral: Negative.   All other systems reviewed and are negative.     Physical Exam: BP 130/82   Pulse 63   Temp (!) 97.3 F (36.3 C)   Ht 5' 6.5" (1.689 m)   Wt 194 lb 3.2 oz (88.1 kg)   SpO2 97%   BMI 30.88 kg/m  Wt Readings from Last 3 Encounters:  01/18/18 194 lb 3.2 oz (88.1 kg)  10/10/17 189 lb 12.8 oz (86.1 kg)  04/15/17 190 lb (86.2 kg)   General Appearance: Well nourished, in no apparent distress. Eyes: PERRLA, EOMs, conjunctiva no swelling or erythema Sinuses: No Frontal/maxillary tenderness ENT/Mouth: Ext aud canals clear, TMs without erythema, bulging. No erythema, swelling, or exudate on post pharynx.  Tonsils not swollen or erythematous. Hearing normal.  Neck: Supple, thyroid normal.  Respiratory: Respiratory effort normal, BS equal bilaterally without  rales, rhonchi, wheezing or stridor.  Cardio: RRR with no MRGs. Brisk peripheral pulses without edema.  Abdomen: Soft, + BS.  Non tender, no guarding, rebound, hernias, masses. Lymphatics: Non tender without lymphadenopathy.  Musculoskeletal: Full ROM, 5/5 strength, Normal gait Skin: Warm, dry without rashes, lesions, ecchymosis.  Neuro: Cranial nerves intact. No cerebellar symptoms.  Psych: Awake and oriented X 3, normal affect, Insight and Judgment appropriate.    Izora Ribas, NP 8:56 AM [facility]

## 2018-01-18 ENCOUNTER — Encounter: Payer: Self-pay | Admitting: Adult Health

## 2018-01-18 ENCOUNTER — Ambulatory Visit (INDEPENDENT_AMBULATORY_CARE_PROVIDER_SITE_OTHER): Payer: BLUE CROSS/BLUE SHIELD | Admitting: Adult Health

## 2018-01-18 VITALS — BP 130/82 | HR 63 | Temp 97.3°F | Ht 66.5 in | Wt 194.2 lb

## 2018-01-18 DIAGNOSIS — K760 Fatty (change of) liver, not elsewhere classified: Secondary | ICD-10-CM

## 2018-01-18 DIAGNOSIS — E559 Vitamin D deficiency, unspecified: Secondary | ICD-10-CM

## 2018-01-18 DIAGNOSIS — I10 Essential (primary) hypertension: Secondary | ICD-10-CM | POA: Diagnosis not present

## 2018-01-18 DIAGNOSIS — R7309 Other abnormal glucose: Secondary | ICD-10-CM | POA: Diagnosis not present

## 2018-01-18 DIAGNOSIS — Z79899 Other long term (current) drug therapy: Secondary | ICD-10-CM | POA: Diagnosis not present

## 2018-01-18 DIAGNOSIS — E785 Hyperlipidemia, unspecified: Secondary | ICD-10-CM | POA: Diagnosis not present

## 2018-01-18 DIAGNOSIS — F3341 Major depressive disorder, recurrent, in partial remission: Secondary | ICD-10-CM

## 2018-01-18 DIAGNOSIS — Z6830 Body mass index (BMI) 30.0-30.9, adult: Secondary | ICD-10-CM

## 2018-01-18 NOTE — Patient Instructions (Signed)
Goals    . Exercise 150 min/wk Moderate Activity    . Weight (lb) < 185 lb (83.9 kg)       Recommend taking potassium every other day for 2-3 weeks, then stop for 2 weeks and will recheck potassium by lab only visit in 8 weeks    Know what a healthy weight is for you (roughly BMI <25) and aim to maintain this  Aim for 7+ servings of fruits and vegetables daily  65-80+ fluid ounces of water or unsweet tea for healthy kidneys  Limit to max 1 drink of alcohol per day; avoid smoking/tobacco  Limit animal fats in diet for cholesterol and heart health - choose grass fed whenever available  Avoid highly processed foods, and foods high in saturated/trans fats  Aim for low stress - take time to unwind and care for your mental health  Aim for 150 min of moderate intensity exercise weekly for heart health, and weights twice weekly for bone health  Aim for 7-9 hours of sleep daily      Drink 1/2 your body weight in fluid ounces of water daily; drink a tall glass of water 30 min before meals  Don't eat until you're stuffed- listen to your stomach and eat until you are 80% full   Try eating off of a salad plate; wait 10 min after finishing before going back for seconds  Start by eating the vegetables on your plate; aim for 50% of your meals to be fruits or vegetables  Then eat your protein - lean meats (grass fed if possible), fish, beans, nuts in moderation  Eat your carbs/starch last ONLY if you still are hungry. If you can, stop before finishing it all  Avoid sugar and flour - the closer it looks to it's original form in nature, typically the better it is for you  Splurge in moderation - "assign" days when you get to splurge and have the "bad stuff" - I like to follow a 80% - 20% plan- "good" choices 80 % of the time, "bad" choices in moderation 20% of the time  Simple equation is: Calories out > calories in = weight loss - even if you eat the bad stuff, if you limit portions,  you will still lose weight       Hypokalemia Hypokalemia means that the amount of potassium in the blood is lower than normal.Potassium is a chemical that helps regulate the amount of fluid in the body (electrolyte). It also stimulates muscle tightening (contraction) and helps nerves work properly.Normally, most of the body's potassium is inside of cells, and only a very small amount is in the blood. Because the amount in the blood is so small, minor changes to potassium levels in the blood can be life-threatening. What are the causes? This condition may be caused by:  Antibiotic medicine.  Diarrhea or vomiting. Taking too much of a medicine that helps you have a bowel movement (laxative) can cause diarrhea and lead to hypokalemia.  Chronic kidney disease (CKD).  Medicines that help the body get rid of excess fluid (diuretics).  Eating disorders, such as bulimia.  Low magnesium levels in the body.  Sweating a lot.  What are the signs or symptoms? Symptoms of this condition include:  Weakness.  Constipation.  Fatigue.  Muscle cramps.  Mental confusion.  Skipped heartbeats or irregular heartbeat (palpitations).  Tingling or numbness.  How is this diagnosed? This condition is diagnosed with a blood test. How is this treated? Hypokalemia  can be treated by taking potassium supplements by mouth or adjusting the medicines that you take. Treatment may also include eating more foods that contain a lot of potassium. If your potassium level is very low, you may need to get potassium through an IV tube in one of your veins and be monitored in the hospital. Follow these instructions at home:  Take over-the-counter and prescription medicines only as told by your health care provider. This includes vitamins and supplements.  Eat a healthy diet. A healthy diet includes fresh fruits and vegetables, whole grains, healthy fats, and lean proteins.  If instructed, eat more foods  that contain a lot of potassium, such as: ? Nuts, such as peanuts and pistachios. ? Seeds, such as sunflower seeds and pumpkin seeds. ? Peas, lentils, and lima beans. ? Whole grain and bran cereals and breads. ? Fresh fruits and vegetables, such as apricots, avocado, bananas, cantaloupe, kiwi, oranges, tomatoes, asparagus, and potatoes. ? Orange juice. ? Tomato juice. ? Red meats. ? Yogurt.  Keep all follow-up visits as told by your health care provider. This is important. Contact a health care provider if:  You have weakness that gets worse.  You feel your heart pounding or racing.  You vomit.  You have diarrhea.  You have diabetes (diabetes mellitus) and you have trouble keeping your blood sugar (glucose) in your target range. Get help right away if:  You have chest pain.  You have shortness of breath.  You have vomiting or diarrhea that lasts for more than 2 days.  You faint. This information is not intended to replace advice given to you by your health care provider. Make sure you discuss any questions you have with your health care provider. Document Released: 02/01/2005 Document Revised: 09/20/2015 Document Reviewed: 09/20/2015 Elsevier Interactive Patient Education  2018 [employer].

## 2018-01-19 LAB — CBC WITH DIFFERENTIAL/PLATELET
BASOS ABS: 30 {cells}/uL (ref 0–200)
Basophils Relative: 0.5 %
Eosinophils Absolute: 89 cells/uL (ref 15–500)
Eosinophils Relative: 1.5 %
HCT: 44.8 % (ref 35.0–45.0)
Hemoglobin: 15.3 g/dL (ref 11.7–15.5)
Lymphs Abs: 2384 cells/uL (ref 850–3900)
MCH: 32.2 pg (ref 27.0–33.0)
MCHC: 34.2 g/dL (ref 32.0–36.0)
MCV: 94.3 fL (ref 80.0–100.0)
MPV: 10.6 fL (ref 7.5–12.5)
Monocytes Relative: 7.8 %
Neutro Abs: 2938 cells/uL (ref 1500–7800)
Neutrophils Relative %: 49.8 %
Platelets: 251 10*3/uL (ref 140–400)
RBC: 4.75 10*6/uL (ref 3.80–5.10)
RDW: 12 % (ref 11.0–15.0)
Total Lymphocyte: 40.4 %
WBC mixed population: 460 cells/uL (ref 200–950)
WBC: 5.9 10*3/uL (ref 3.8–10.8)

## 2018-01-19 LAB — VITAMIN D 25 HYDROXY (VIT D DEFICIENCY, FRACTURES): VIT D 25 HYDROXY: 98 ng/mL (ref 30–100)

## 2018-01-19 LAB — COMPLETE METABOLIC PANEL WITH GFR
AG Ratio: 2 (calc) (ref 1.0–2.5)
ALKALINE PHOSPHATASE (APISO): 100 U/L (ref 33–130)
ALT: 43 U/L — AB (ref 6–29)
AST: 31 U/L (ref 10–35)
Albumin: 4.5 g/dL (ref 3.6–5.1)
BUN: 14 mg/dL (ref 7–25)
CO2: 30 mmol/L (ref 20–32)
Calcium: 10.2 mg/dL (ref 8.6–10.4)
Chloride: 102 mmol/L (ref 98–110)
Creat: 0.61 mg/dL (ref 0.50–0.99)
GFR, Est African American: 111 mL/min/{1.73_m2} (ref >=60)
GFR, Est Non African American: 96 mL/min/{1.73_m2} (ref >=60)
GLUCOSE: 84 mg/dL (ref 65–99)
Globulin: 2.3 g/dL (calc) (ref 1.9–3.7)
Potassium: 4.3 mmol/L (ref 3.5–5.3)
Sodium: 140 mmol/L (ref 135–146)
Total Bilirubin: 0.4 mg/dL (ref 0.2–1.2)
Total Protein: 6.8 g/dL (ref 6.1–8.1)

## 2018-01-19 LAB — LIPID PANEL
CHOLESTEROL: 193 mg/dL (ref <200)
HDL: 57 mg/dL (ref >50)
LDL Cholesterol (Calc): 96 mg/dL (calc)
Non-HDL Cholesterol (Calc): 136 mg/dL (calc) — ABNORMAL HIGH (ref <130)
TRIGLYCERIDES: 282 mg/dL — AB (ref <150)
Total CHOL/HDL Ratio: 3.4 (calc) (ref <5.0)

## 2018-01-19 LAB — HEMOGLOBIN A1C
Hgb A1c MFr Bld: 5.5 % of total Hgb (ref <5.7)
Mean Plasma Glucose: 111 (calc)
eAG (mmol/L): 6.2 (calc)

## 2018-01-19 LAB — TSH: TSH: 2.9 m[IU]/L (ref 0.40–4.50)

## 2018-03-01 ENCOUNTER — Other Ambulatory Visit: Payer: Self-pay | Admitting: Adult Health

## 2018-03-01 ENCOUNTER — Other Ambulatory Visit (INDEPENDENT_AMBULATORY_CARE_PROVIDER_SITE_OTHER): Payer: BLUE CROSS/BLUE SHIELD

## 2018-03-01 DIAGNOSIS — E876 Hypokalemia: Secondary | ICD-10-CM

## 2018-03-01 DIAGNOSIS — E785 Hyperlipidemia, unspecified: Secondary | ICD-10-CM

## 2018-03-01 LAB — BASIC METABOLIC PANEL WITH GFR
BUN: 13 mg/dL (ref 7–25)
CO2: 29 mmol/L (ref 20–32)
Calcium: 9.7 mg/dL (ref 8.6–10.4)
Chloride: 101 mmol/L (ref 98–110)
Creat: 0.68 mg/dL (ref 0.50–0.99)
GFR, Est African American: 107 mL/min/{1.73_m2} (ref >=60)
GFR, Est Non African American: 92 mL/min/{1.73_m2} (ref >=60)
Glucose, Bld: 82 mg/dL (ref 65–99)
Potassium: 4.1 mmol/L (ref 3.5–5.3)
Sodium: 138 mmol/L (ref 135–146)

## 2018-03-02 ENCOUNTER — Other Ambulatory Visit: Payer: Self-pay | Admitting: Adult Health

## 2018-03-03 ENCOUNTER — Telehealth: Payer: Self-pay

## 2018-03-03 DIAGNOSIS — I10 Essential (primary) hypertension: Secondary | ICD-10-CM

## 2018-03-03 DIAGNOSIS — R001 Bradycardia, unspecified: Secondary | ICD-10-CM

## 2018-03-03 MED ORDER — ATENOLOL 25 MG PO TABS: 25.0000 mg | ORAL_TABLET | Freq: Every day | ORAL | 1 refills | Status: DC

## 2018-03-03 MED ORDER — SERTRALINE HCL 100 MG PO TABS: 100.0000 mg | ORAL_TABLET | Freq: Every day | ORAL | 0 refills | Status: DC

## 2018-03-03 MED ORDER — HYDROCHLOROTHIAZIDE 25 MG PO TABS: 25.0000 mg | ORAL_TABLET | Freq: Every day | ORAL | 1 refills | Status: DC

## 2018-03-03 NOTE — Telephone Encounter (Signed)
Refill request

## 2018-03-06 ENCOUNTER — Other Ambulatory Visit: Payer: Self-pay

## 2018-03-06 MED ORDER — PRAVASTATIN SODIUM 40 MG PO TABS: 40.0000 mg | ORAL_TABLET | Freq: Every day | ORAL | 1 refills | Status: DC

## 2018-03-15 ENCOUNTER — Other Ambulatory Visit: Payer: Self-pay

## 2018-04-21 ENCOUNTER — Ambulatory Visit: Payer: Self-pay | Admitting: Internal Medicine

## 2018-05-25 ENCOUNTER — Other Ambulatory Visit: Payer: Self-pay | Admitting: Adult Health

## 2018-06-12 NOTE — Progress Notes (Deleted)
FOLLOW UP  Assessment and Plan:   Hypertension Well controlled with current medications  Monitor blood pressure at home; patient to call if consistently greater than 130/80 Continue DASH diet.   Reminder to go to the ER if any CP, SOB, nausea, dizziness, severe HA, changes vision/speech, left arm numbness and tingling and jaw pain.  Cholesterol Currently at goal; continue pravastatin Continue low cholesterol diet and exercise.  Check lipid panel.   Other abnormal glucose Recent A1Cs at goal Discussed diet/exercise, weight management  Defer A1C; check CMP  Obesity with co morbidities Long discussion about weight loss, diet, and exercise Recommended diet heavy in fruits and veggies and low in animal meats, cheeses, and dairy products, appropriate calorie intake Discussed ideal weight for height and initial weight goal (185lb) Patient will work on starting exercise, watching portions Will follow up in 3 months  Vitamin D Def At goal at last visit; continue supplementation to maintain goal of 60-100 Defer Vit D level  Continue diet and meds as discussed. Further disposition pending results of labs. Discussed med's effects and SE's.   Over 30 minutes of exam, counseling, chart review, and critical decision making was performed.   Future Appointments  Date Time Provider [place]  06/14/2018  9:30 AM Liane Comber, NP [facility] None  09/18/2018  9:30 AM Unk Pinto, MD [facility] None  12/27/2018  9:00 AM Unk Pinto, MD [facility] None    ----------------------------------------------------------------------------------------------------------------------  HPI 63 y.o. female  presents for 3 month follow up on hypertension, cholesterol, glucose manageement, weight, depression/anxiety and vitamin D deficiency.   she has a diagnosis of depression/anxiety and is currently on zoloft 100 mg daily, reports symptoms are well controlled on current regimen.   BMI  is There is no height or weight on file to calculate BMI., she has not been working on diet and exercise, but plans to start exercising now that she is retired.  Wt Readings from Last 3 Encounters:  01/18/18 194 lb 3.2 oz (88.1 kg)  10/10/17 189 lb 12.8 oz (86.1 kg)  04/15/17 190 lb (86.2 kg)   Her blood pressure has been controlled at home, today their BP is    She does not workout. She denies chest pain, shortness of breath, dizziness.   She is on cholesterol medication Pravastatin and denies myalgias. Her LDL cholesterol is at goal. The cholesterol last visit was:   Lab Results  Component Value Date   CHOL 193 01/18/2018   HDL 57 01/18/2018   LDLCALC 96 01/18/2018   TRIG 282 (H) 01/18/2018   CHOLHDL 3.4 01/18/2018    She has not been working on diet and exercise for glucose management, and denies increased appetite, nausea, paresthesia of the feet, polydipsia, polyuria and visual disturbances. Last A1C in the office was:  Lab Results  Component Value Date   HGBA1C 5.5 01/18/2018   Patient is on Vitamin D supplement.   Lab Results  Component Value Date   VD25OH 98 01/18/2018       Current Medications:  Current Outpatient Medications on File Prior to Visit  Medication Sig  . aspirin 81 MG tablet Take 81 mg by mouth daily.  Marland Kitchen atenolol (TENORMIN) 25 MG tablet Take 1 tablet (25 mg total) by mouth daily. For BP  . Cholecalciferol (VITAMIN D) 2000 units tablet Take 1 tablet 2 x (10,000 u)  /day  . hydrochlorothiazide (HYDRODIURIL) 25 MG tablet Take 1 tablet (25 mg total) by mouth daily.  . Multiple Vitamin (MULTIVITAMIN WITH MINERALS)  TABS tablet Take 1 tablet by mouth daily.  . pravastatin (PRAVACHOL) 40 MG tablet Take 1 tablet (40 mg total) by mouth daily.  . sertraline (ZOLOFT) 100 MG tablet Take 1 tablet by mouth once daily   No current facility-administered medications on file prior to visit.      Allergies:  Allergies  Allergen Reactions  . Atorvastatin Nausea Only      Medical History:  Past Medical History:  Diagnosis Date  . Allergy   . Climacteric   . Fibrocystic breast   . Hyperlipidemia   . Hypertension   . Vitamin D deficiency    Family history- Reviewed and unchanged Social history- Reviewed and unchanged   Review of Systems:  Review of Systems  Constitutional: Negative for malaise/fatigue and weight loss.  HENT: Negative for hearing loss and tinnitus.   Eyes: Negative for blurred vision and double vision.  Respiratory: Negative for cough, shortness of breath and wheezing.   Cardiovascular: Negative for chest pain, palpitations, orthopnea, claudication and leg swelling.  Gastrointestinal: Negative for abdominal pain, blood in stool, constipation, diarrhea, heartburn, melena, nausea and vomiting.  Genitourinary: Negative.   Musculoskeletal: Negative for joint pain and myalgias.  Skin: Negative for rash.  Neurological: Negative for dizziness, tingling, sensory change, weakness and headaches.  Endo/Heme/Allergies: Negative for polydipsia.  Psychiatric/Behavioral: Negative.   All other systems reviewed and are negative.     Physical Exam: There were no vitals taken for this visit. Wt Readings from Last 3 Encounters:  01/18/18 194 lb 3.2 oz (88.1 kg)  10/10/17 189 lb 12.8 oz (86.1 kg)  04/15/17 190 lb (86.2 kg)   General Appearance: Well nourished, in no apparent distress. Eyes: PERRLA, EOMs, conjunctiva no swelling or erythema Sinuses: No Frontal/maxillary tenderness ENT/Mouth: Ext aud canals clear, TMs without erythema, bulging. No erythema, swelling, or exudate on post pharynx.  Tonsils not swollen or erythematous. Hearing normal.  Neck: Supple, thyroid normal.  Respiratory: Respiratory effort normal, BS equal bilaterally without rales, rhonchi, wheezing or stridor.  Cardio: RRR with no MRGs. Brisk peripheral pulses without edema.  Abdomen: Soft, + BS.  Non tender, no guarding, rebound, hernias, masses. Lymphatics: Non  tender without lymphadenopathy.  Musculoskeletal: Full ROM, 5/5 strength, Normal gait Skin: Warm, dry without rashes, lesions, ecchymosis.  Neuro: Cranial nerves intact. No cerebellar symptoms.  Psych: Awake and oriented X 3, normal affect, Insight and Judgment appropriate.    Izora Ribas, NP 8:18 AM [facility]

## 2018-06-14 ENCOUNTER — Ambulatory Visit: Payer: Self-pay | Admitting: Adult Health

## 2018-08-14 ENCOUNTER — Other Ambulatory Visit: Payer: Self-pay | Admitting: Adult Health

## 2018-08-14 DIAGNOSIS — R001 Bradycardia, unspecified: Secondary | ICD-10-CM

## 2018-08-14 DIAGNOSIS — I10 Essential (primary) hypertension: Secondary | ICD-10-CM

## 2018-09-18 ENCOUNTER — Ambulatory Visit: Payer: Self-pay | Admitting: Internal Medicine

## 2018-09-25 ENCOUNTER — Other Ambulatory Visit: Payer: Self-pay | Admitting: Adult Health

## 2018-09-25 ENCOUNTER — Other Ambulatory Visit: Payer: Self-pay | Admitting: Physician Assistant

## 2018-10-30 ENCOUNTER — Encounter: Payer: Self-pay | Admitting: Internal Medicine

## 2018-12-09 ENCOUNTER — Emergency Department (HOSPITAL_COMMUNITY)
Admission: EM | Admit: 2018-12-09 | Discharge: 2018-12-09 | Disposition: A | Discharge status: Home / Self Care | Payer: BLUE CROSS/BLUE SHIELD | Attending: Emergency Medicine | Admitting: Emergency Medicine

## 2018-12-09 ENCOUNTER — Encounter (HOSPITAL_COMMUNITY): Payer: Self-pay

## 2018-12-09 ENCOUNTER — Other Ambulatory Visit: Payer: Self-pay

## 2018-12-09 ENCOUNTER — Emergency Department (HOSPITAL_COMMUNITY): Payer: Self-pay

## 2018-12-09 DIAGNOSIS — W268XXA Contact with other sharp object(s), not elsewhere classified, initial encounter: Secondary | ICD-10-CM | POA: Insufficient documentation

## 2018-12-09 DIAGNOSIS — S61011A Laceration without foreign body of right thumb without damage to nail, initial encounter: Secondary | ICD-10-CM | POA: Insufficient documentation

## 2018-12-09 DIAGNOSIS — Y93H2 Activity, gardening and landscaping: Secondary | ICD-10-CM | POA: Insufficient documentation

## 2018-12-09 DIAGNOSIS — S61111A Laceration without foreign body of right thumb with damage to nail, initial encounter: Secondary | ICD-10-CM

## 2018-12-09 DIAGNOSIS — Y92017 Garden or yard in single-family (private) house as the place of occurrence of the external cause: Secondary | ICD-10-CM | POA: Insufficient documentation

## 2018-12-09 DIAGNOSIS — I10 Essential (primary) hypertension: Secondary | ICD-10-CM | POA: Insufficient documentation

## 2018-12-09 DIAGNOSIS — Z87891 Personal history of nicotine dependence: Secondary | ICD-10-CM | POA: Insufficient documentation

## 2018-12-09 DIAGNOSIS — Z79899 Other long term (current) drug therapy: Secondary | ICD-10-CM | POA: Insufficient documentation

## 2018-12-09 DIAGNOSIS — Z7982 Long term (current) use of aspirin: Secondary | ICD-10-CM | POA: Insufficient documentation

## 2018-12-09 DIAGNOSIS — Y999 Unspecified external cause status: Secondary | ICD-10-CM | POA: Insufficient documentation

## 2018-12-09 MED ORDER — BUPIVACAINE HCL (PF) 0.5 % IJ SOLN
20.0000 mL | Freq: Once | INTRAMUSCULAR | Status: AC
  Administered 2018-12-09: 20:00:00 20 mL
  Filled 2018-12-09: qty 20

## 2018-12-09 MED ORDER — HYDROCODONE-ACETAMINOPHEN 5-325 MG PO TABS: 1.0000 | ORAL_TABLET | Freq: Four times a day (QID) | ORAL | 0 refills | Status: DC | PRN

## 2018-12-09 MED ORDER — CEPHALEXIN 500 MG PO CAPS: 500.0000 mg | ORAL_CAPSULE | Freq: Four times a day (QID) | ORAL | 0 refills | Status: DC

## 2018-12-09 MED ORDER — CEFAZOLIN SODIUM-DEXTROSE 2-4 GM/100ML-% IV SOLN
2.0000 g | Freq: Once | INTRAVENOUS | Status: AC
  Administered 2018-12-09: 2 g via INTRAVENOUS
  Filled 2018-12-09: qty 100

## 2018-12-09 NOTE — ED Notes (Signed)
Patient transported to x-ray. ?

## 2018-12-09 NOTE — ED Notes (Signed)
ED Provider at bedside. 

## 2018-12-09 NOTE — Progress Notes (Signed)
Orthopedic Tech Progress Note Patient Details:  Ruth Ward 31-Jan-1956 [id number]  Ortho Devices Type of Ortho Device: Finger splint Ortho Device/Splint Location: rue. thumb Ortho Device/Splint Interventions: Ordered, Application, Adjustment   Post Interventions Patient Tolerated: Well Instructions Provided: Care of device, Adjustment of device   Karolee Stamps 12/09/2018, 11:29 PM

## 2018-12-09 NOTE — ED Notes (Signed)
Pt husband wants to be called when pt go back to room [employer] [phone]

## 2018-12-09 NOTE — ED Notes (Signed)
Patient verbalizes understanding of discharge instructions. Opportunity for questioning and answers were provided. Armband removed by staff, pt discharged from ED.  

## 2018-12-09 NOTE — ED Notes (Signed)
Ortho paged. 

## 2018-12-09 NOTE — ED Provider Notes (Signed)
[facility] EMERGENCY DEPARTMENT Provider Note   CSN: [id number] Arrival date & time: 12/09/18  1641     History   Chief Complaint Chief Complaint  Patient presents with  . Finger Injury    HPI Ruth Ward is a 63 y.o. female.     HPI Patient presents to the emergency department with a right thumb injury that occurred prior to arrival.  The patient states she was cutting her grass when the lawnmower blade struck her metal wire that these to hang Christmas ornaments in a tree with.  The patient states the wire came back and hit her finger.  Patient states that she is started having a fair amount of bleeding from the area and she controlled with direct pressure.  Patient states that her tetanus is up-to-date.  She states she has no other injuries.  She states lumbar plate did actually hit her finger. Past Medical History:  Diagnosis Date  . Allergy   . Climacteric   . Fibrocystic breast   . Hyperlipidemia   . Hypertension   . Vitamin D deficiency     Patient Active Problem List   Diagnosis Date Noted  . BMI 30.0-30.9,adult 01/16/2018  . Fatty liver 03/01/2017  . Depression, major, recurrent, in partial remission ([place]) 05/02/2014  . Other abnormal glucose 10/09/2013  . Medication management 10/09/2013  . Hypertension   . Hyperlipidemia   . Vitamin D deficiency     Past Surgical History:  Procedure Laterality Date  . ABDOMINAL SURGERY  09/2016   "tummy tuck"  . [place]  . TUBAL LIGATION       OB History   No obstetric history on file.      Home Medications    Prior to Admission medications   Medication Sig Start Date End Date Taking? Authorizing Provider  aspirin 81 MG tablet Take 81 mg by mouth daily.    [provider]  atenolol (TENORMIN) 25 MG tablet Take 1 tablet Daily for BP 08/14/18   Unk Pinto, MD  Cholecalciferol (VITAMIN D) 2000 units tablet Take 1 tablet 2 x (10,000 u)  /day 10/13/17    Unk Pinto, MD  hydrochlorothiazide (HYDRODIURIL) 25 MG tablet Take 1 tablet Daily for BP &Fluid 09/25/18   Unk Pinto, MD  Multiple Vitamin (MULTIVITAMIN WITH MINERALS) TABS tablet Take 1 tablet by mouth daily.    [provider]  pravastatin (PRAVACHOL) 40 MG tablet Take 1 tablet at Bedtime for Cholesterol 08/14/18   Unk Pinto, MD  sertraline (ZOLOFT) 100 MG tablet Take 1 tablet by mouth once daily 09/25/18   Liane Comber, NP    Family History Family History  Problem Relation Age of Onset  . Diabetes Other   . Hypertension Mother   . Diabetes Mother   . Hypertension Father   . Heart disease Father   . Cancer Father   . Prostate cancer Father   . Hypertension Brother   . Diabetes Brother   . Diabetes Brother   . Colon cancer Neg Hx     Social History Social History   Tobacco Use  . Smoking status: Former Smoker    Quit date: 02/15/1986    Years since quitting: 32.8  . Smokeless tobacco: Never Used  Substance Use Topics  . Alcohol use: No  . Drug use: No     Allergies   Atorvastatin   Review of Systems Review of Systems  All other systems negative except as documented  in the HPI. All pertinent positives and negatives as reviewed in the HPI. Physical Exam Updated Vital Signs BP (!) 169/76   Pulse 78   Temp 98.5 F (36.9 C) (Oral)   Resp 18   SpO2 99%   Physical Exam Vitals signs and nursing note reviewed.  Constitutional:      General: She is not in acute distress.    Appearance: She is well-developed.  HENT:     Head: Normocephalic and atraumatic.  Eyes:     Pupils: Pupils are equal, round, and reactive to light.  Pulmonary:     Effort: Pulmonary effort is normal.  Musculoskeletal:       Hands:  Skin:    General: Skin is warm and dry.  Neurological:     Mental Status: She is alert and oriented to person, place, and time.      ED Treatments / Results  Labs (all labs ordered are listed, but only abnormal results are  displayed) Labs Reviewed - No data to display  EKG None  Radiology Dg Finger Thumb Right  Result Date: 12/09/2018 CLINICAL DATA:  Right thumb laceration, lawnmower injury EXAM: RIGHT THUMB 2+V COMPARISON:  None. FINDINGS: There is soft tissue laceration and irregularity of the distal first digit with absence of the nail plate and a comminuted fracture of the first distal phalanx and a small linear fracture fragment displaced dorsally into soft tissues of the nail bed. Small accessory ossicle seen along the ulnar base of the first proximal phalanx. No traumatic malalignment. IMPRESSION: Laceration of the distal first digit with absence of nail plate and a comminuted, mildly displaced fracture of the first distal phalanx. Correlate with visual inspection to exclude an open fracture. Electronically Signed   By: Lovena Le M.D.   On: 12/09/2018 19:29    Procedures Procedures (including critical care time)  Medications Ordered in ED Medications  bupivacaine (MARCAINE) 0.5 % injection 20 mL (20 mLs Infiltration Given by Other 12/09/18 2016)  ceFAZolin (ANCEF) IVPB 2g/100 mL premix (0 g Intravenous Stopped 12/09/18 2127)     Initial Impression / Assessment and Plan / ED Course  I have reviewed the triage vital signs and the nursing notes.  Pertinent labs & imaging results that were available during my care of the patient were reviewed by me and considered in my medical decision making (see chart for details).       I spoke with hand surgery who reviewed the patient's findings and will follow up with the patient in her office on Tuesday.  And plan to take her to the OR sometime this week.  Patient is advised the plan and all questions were answered.  LACERATION REPAIR Performed by: Brent General Authorized by: Resa Miner Olivea Sonnen Consent: Verbal consent obtained. Risks and benefits: risks, benefits and alternatives were discussed Consent given by: patient Patient identity  confirmed: provided demographic data Prepped and Draped in normal sterile fashion Wound explored  Laceration Location: R thumb  Laceration Length: 3 cm  No Foreign Bodies seen or palpated  Anesthesia: local infiltration  Local anesthetic: Marcaine .5% digital block  Anesthetic total: 6 ml  Irrigation method: syringe Amount of cleaning: standard  Skin closure: 4-0 Prolene  Number of sutures: 5  Technique: simple interrupted  Patient tolerance: Patient tolerated the procedure well with no immediate complications.   Skin was loosely approximated as directed by the hand surgeon.  Patient is advised the plan and all questions were answered.  Patient will be placed  in a splint with a bulky dressing.  Final Clinical Impressions(s) / ED Diagnoses   Final diagnoses:  None    ED Discharge Orders    None       Rebeca Allegra 12/09/18 2156    Elnora Morrison, MD 12/10/18 [phone]

## 2018-12-09 NOTE — ED Notes (Signed)
Ortho at bedside.

## 2018-12-09 NOTE — ED Triage Notes (Signed)
Patient with right hand thumb laceration after cutting same on lawnmower blade, bleeding controlled

## 2018-12-09 NOTE — Discharge Instructions (Signed)
Follow-up with the hand surgeon provided by calling his office Monday morning to set up a Tuesday appointment.

## 2018-12-12 ENCOUNTER — Other Ambulatory Visit (HOSPITAL_COMMUNITY)
Admission: RE | Admit: 2018-12-12 | Discharge: 2018-12-12 | Disposition: A | Discharge status: Home / Self Care | Payer: Self-pay | Source: Ambulatory Visit | Attending: Orthopaedic Surgery | Admitting: Orthopaedic Surgery

## 2018-12-12 ENCOUNTER — Encounter (HOSPITAL_BASED_OUTPATIENT_CLINIC_OR_DEPARTMENT_OTHER): Payer: Self-pay | Admitting: *Deleted

## 2018-12-12 ENCOUNTER — Other Ambulatory Visit: Payer: Self-pay

## 2018-12-12 DIAGNOSIS — Z01812 Encounter for preprocedural laboratory examination: Secondary | ICD-10-CM | POA: Insufficient documentation

## 2018-12-12 DIAGNOSIS — Z20828 Contact with and (suspected) exposure to other viral communicable diseases: Secondary | ICD-10-CM | POA: Insufficient documentation

## 2018-12-12 LAB — SARS CORONAVIRUS 2 (TAT 6-24 HRS): SARS Coronavirus 2: NEGATIVE

## 2018-12-14 ENCOUNTER — Encounter (HOSPITAL_BASED_OUTPATIENT_CLINIC_OR_DEPARTMENT_OTHER): Payer: Self-pay | Admitting: Anesthesiology

## 2018-12-14 ENCOUNTER — Encounter (HOSPITAL_BASED_OUTPATIENT_CLINIC_OR_DEPARTMENT_OTHER): Payer: Self-pay | Admitting: *Deleted

## 2018-12-14 ENCOUNTER — Encounter (HOSPITAL_BASED_OUTPATIENT_CLINIC_OR_DEPARTMENT_OTHER)
Admission: RE | Disposition: A | Discharge status: Home / Self Care | Payer: Self-pay | Source: Home / Self Care | Attending: Orthopaedic Surgery

## 2018-12-14 ENCOUNTER — Ambulatory Visit (HOSPITAL_BASED_OUTPATIENT_CLINIC_OR_DEPARTMENT_OTHER)
Admission: RE | Admit: 2018-12-14 | Discharge: 2018-12-14 | Disposition: A | Discharge status: Home / Self Care | Payer: Self-pay | Attending: Orthopaedic Surgery | Admitting: Orthopaedic Surgery

## 2018-12-14 DIAGNOSIS — S62521B Displaced fracture of distal phalanx of right thumb, initial encounter for open fracture: Secondary | ICD-10-CM | POA: Insufficient documentation

## 2018-12-14 DIAGNOSIS — Y93H2 Activity, gardening and landscaping: Secondary | ICD-10-CM | POA: Insufficient documentation

## 2018-12-14 DIAGNOSIS — Z87891 Personal history of nicotine dependence: Secondary | ICD-10-CM | POA: Insufficient documentation

## 2018-12-14 DIAGNOSIS — W208XXA Other cause of strike by thrown, projected or falling object, initial encounter: Secondary | ICD-10-CM | POA: Insufficient documentation

## 2018-12-14 DIAGNOSIS — F329 Major depressive disorder, single episode, unspecified: Secondary | ICD-10-CM | POA: Insufficient documentation

## 2018-12-14 DIAGNOSIS — F419 Anxiety disorder, unspecified: Secondary | ICD-10-CM | POA: Insufficient documentation

## 2018-12-14 DIAGNOSIS — E7800 Pure hypercholesterolemia, unspecified: Secondary | ICD-10-CM | POA: Insufficient documentation

## 2018-12-14 DIAGNOSIS — I10 Essential (primary) hypertension: Secondary | ICD-10-CM | POA: Insufficient documentation

## 2018-12-14 DIAGNOSIS — Y92007 Garden or yard of unspecified non-institutional (private) residence as the place of occurrence of the external cause: Secondary | ICD-10-CM | POA: Insufficient documentation

## 2018-12-14 DIAGNOSIS — E559 Vitamin D deficiency, unspecified: Secondary | ICD-10-CM | POA: Insufficient documentation

## 2018-12-14 DIAGNOSIS — E785 Hyperlipidemia, unspecified: Secondary | ICD-10-CM | POA: Insufficient documentation

## 2018-12-14 DIAGNOSIS — Z7982 Long term (current) use of aspirin: Secondary | ICD-10-CM | POA: Insufficient documentation

## 2018-12-14 DIAGNOSIS — Z888 Allergy status to other drugs, medicaments and biological substances status: Secondary | ICD-10-CM | POA: Insufficient documentation

## 2018-12-14 DIAGNOSIS — Z79899 Other long term (current) drug therapy: Secondary | ICD-10-CM | POA: Insufficient documentation

## 2018-12-14 HISTORY — DX: Pure hypercholesterolemia, unspecified: E78.00

## 2018-12-14 HISTORY — DX: Anxiety disorder, unspecified: F41.9

## 2018-12-14 HISTORY — PX: I & D EXTREMITY: SHX5045

## 2018-12-14 HISTORY — DX: Laceration without foreign body of right thumb without damage to nail, initial encounter: S61.011A

## 2018-12-14 HISTORY — DX: Depression, unspecified: F32.A

## 2018-12-14 SURGERY — MINOR IRRIGATION AND DEBRIDEMENT EXTREMITY
Anesthesia: LOCAL | Site: Thumb | Laterality: Right

## 2018-12-14 MED ORDER — CEPHALEXIN 500 MG PO CAPS: 500.0000 mg | ORAL_CAPSULE | Freq: Four times a day (QID) | ORAL | 0 refills | Status: DC

## 2018-12-14 MED ORDER — LIDOCAINE HCL (PF) 1 % IJ SOLN
INTRAMUSCULAR | Status: AC
  Filled 2018-12-14: qty 30

## 2018-12-14 MED ORDER — POVIDONE-IODINE 10 % EX SWAB: 2.0000 "application " | Freq: Once | CUTANEOUS | Status: DC

## 2018-12-14 MED ORDER — ENSURE PRE-SURGERY PO LIQD: 296.0000 mL | Freq: Once | ORAL | Status: DC

## 2018-12-14 MED ORDER — CHLORHEXIDINE GLUCONATE 4 % EX LIQD: 60.0000 mL | Freq: Once | CUTANEOUS | Status: DC

## 2018-12-14 MED ORDER — LIDOCAINE HCL 1 % IJ SOLN
INTRAMUSCULAR | Status: DC | PRN
  Administered 2018-12-14: 10 mL via INTRAMUSCULAR

## 2018-12-14 MED ORDER — BUPIVACAINE HCL (PF) 0.5 % IJ SOLN
INTRAMUSCULAR | Status: AC
  Filled 2018-12-14: qty 30

## 2018-12-14 MED ORDER — LIDOCAINE HCL (PF) 1 % IJ SOLN
INTRAMUSCULAR | Status: DC | PRN
  Administered 2018-12-14: 6 mL

## 2018-12-14 MED ORDER — HYDROCODONE-ACETAMINOPHEN 5-325 MG PO TABS: 1.0000 | ORAL_TABLET | Freq: Four times a day (QID) | ORAL | 0 refills | Status: DC | PRN

## 2018-12-14 SURGICAL SUPPLY — 78 items
APL SKNCLS STERI-STRIP NONHPOA (GAUZE/BANDAGES/DRESSINGS)
BENZOIN TINCTURE PRP APPL 2/3 (GAUZE/BANDAGES/DRESSINGS) IMPLANT
BLADE CLIPPER SURG (BLADE) IMPLANT
BLADE SURG 15 STRL LF DISP TIS (BLADE) ×2 IMPLANT
BLADE SURG 15 STRL SS (BLADE) ×2
BNDG CMPR 9X4 STRL LF SNTH (GAUZE/BANDAGES/DRESSINGS)
BNDG COHESIVE 1X5 TAN STRL LF (GAUZE/BANDAGES/DRESSINGS) ×1 IMPLANT
BNDG CONFORM 2 STRL LF (GAUZE/BANDAGES/DRESSINGS) ×1 IMPLANT
BNDG ELASTIC 3X5.8 VLCR STR LF (GAUZE/BANDAGES/DRESSINGS) IMPLANT
BNDG ELASTIC 4X5.8 VLCR STR LF (GAUZE/BANDAGES/DRESSINGS) IMPLANT
BNDG ESMARK 4X9 LF (GAUZE/BANDAGES/DRESSINGS) ×1 IMPLANT
BNDG GAUZE ELAST 4 BULKY (GAUZE/BANDAGES/DRESSINGS) IMPLANT
BRUSH SCRUB EZ PLAIN DRY (MISCELLANEOUS) ×1 IMPLANT
CANISTER SUCT 1200ML W/VALVE (MISCELLANEOUS) ×2 IMPLANT
CAP PIN PROTECTOR ORTHO WHT (CAP) ×1 IMPLANT
CORD BIPOLAR FORCEPS 12FT (ELECTRODE) ×1 IMPLANT
COVER BACK TABLE REUSABLE LG (DRAPES) ×2 IMPLANT
COVER WAND RF STERILE (DRAPES) IMPLANT
CUFF TOURN SGL QUICK 18X4 (TOURNIQUET CUFF) ×1 IMPLANT
DECANTER SPIKE VIAL GLASS SM (MISCELLANEOUS) IMPLANT
DRAIN PENROSE 1/4X12 LTX STRL (WOUND CARE) ×1 IMPLANT
DRAPE EXTREMITY T 121X128X90 (DISPOSABLE) ×2 IMPLANT
DRAPE HALF SHEET 70X43 (DRAPES) ×2 IMPLANT
DRAPE OEC MINIVIEW 54X84 (DRAPES) ×2 IMPLANT
DRAPE SURG 17X23 STRL (DRAPES) ×2 IMPLANT
DRSG EMULSION OIL 3X3 NADH (GAUZE/BANDAGES/DRESSINGS) IMPLANT
GAUZE 4X4 16PLY RFD (DISPOSABLE) IMPLANT
GAUZE SPONGE 4X4 12PLY STRL (GAUZE/BANDAGES/DRESSINGS) ×2 IMPLANT
GAUZE XEROFORM 1X8 LF (GAUZE/BANDAGES/DRESSINGS) IMPLANT
GAUZE XEROFORM 5X9 LF (GAUZE/BANDAGES/DRESSINGS) ×1 IMPLANT
GLOVE BIOGEL PI IND STRL 8 (GLOVE) ×1 IMPLANT
GLOVE BIOGEL PI INDICATOR 8 (GLOVE) ×1
GLOVE SURG SYN 7.5  E (GLOVE)
GLOVE SURG SYN 7.5 E (GLOVE) IMPLANT
GLOVE SURG SYN 7.5 PF PI (GLOVE) IMPLANT
GOWN STRL REUS W/ TWL LRG LVL3 (GOWN DISPOSABLE) ×1 IMPLANT
GOWN STRL REUS W/ TWL XL LVL3 (GOWN DISPOSABLE) ×1 IMPLANT
GOWN STRL REUS W/TWL LRG LVL3 (GOWN DISPOSABLE) ×4
GOWN STRL REUS W/TWL XL LVL3 (GOWN DISPOSABLE)
K-WIRE .045X4 (WIRE) ×1 IMPLANT
NDL HYPO 25X1 1.5 SAFETY (NEEDLE) IMPLANT
NDL SAFETY ECLIPSE 18X1.5 (NEEDLE) IMPLANT
NEEDLE HYPO 18GX1.5 SHARP (NEEDLE) ×2
NEEDLE HYPO 22GX1.5 SAFETY (NEEDLE) IMPLANT
NEEDLE HYPO 25X1 1.5 SAFETY (NEEDLE) ×2 IMPLANT
NS IRRIG 1000ML POUR BTL (IV SOLUTION) ×2 IMPLANT
PACK BASIN DAY SURGERY FS (CUSTOM PROCEDURE TRAY) ×2 IMPLANT
PAD CAST 3X4 CTTN HI CHSV (CAST SUPPLIES) ×1 IMPLANT
PAD CAST 4YDX4 CTTN HI CHSV (CAST SUPPLIES) IMPLANT
PADDING CAST ABS 3INX4YD NS (CAST SUPPLIES) ×1
PADDING CAST ABS COTTON 3X4 (CAST SUPPLIES) ×1 IMPLANT
PADDING CAST COTTON 3X4 STRL (CAST SUPPLIES) ×2
PADDING CAST COTTON 4X4 STRL (CAST SUPPLIES)
SPLINT FIBERGLASS 3X35 (CAST SUPPLIES) IMPLANT
SPLINT FINGER 2.25 911902 (SOFTGOODS) ×1 IMPLANT
SPLINT PLASTER CAST XFAST 3X15 (CAST SUPPLIES) IMPLANT
SPLINT PLASTER XTRA FASTSET 3X (CAST SUPPLIES)
STOCKINETTE 4X48 STRL (DRAPES) ×2 IMPLANT
STOCKINETTE SYNTHETIC 3 UNSTER (CAST SUPPLIES) IMPLANT
STRIP CLOSURE SKIN 1/2X4 (GAUZE/BANDAGES/DRESSINGS) IMPLANT
SUCTION FRAZIER HANDLE 10FR (MISCELLANEOUS)
SUCTION TUBE FRAZIER 10FR DISP (MISCELLANEOUS) IMPLANT
SUT ETHIBOND 3-0 V-5 (SUTURE) IMPLANT
SUT ETHILON 4 0 PS 2 18 (SUTURE) IMPLANT
SUT ETHILON 5 0 PS 2 18 (SUTURE) IMPLANT
SUT PLAIN 5 0 P 3 18 (SUTURE) ×1 IMPLANT
SUT PROLENE 4 0 PS 2 18 (SUTURE) IMPLANT
SUT VIC AB 3-0 FS2 27 (SUTURE) IMPLANT
SUT VIC AB 4-0 P-3 18XBRD (SUTURE) IMPLANT
SUT VIC AB 4-0 P3 18 (SUTURE)
SUT VICRYL 4-0 PS2 18IN ABS (SUTURE) ×1 IMPLANT
SYR BULB 3OZ (MISCELLANEOUS) ×2 IMPLANT
SYR CONTROL 10ML LL (SYRINGE) ×2 IMPLANT
TAPE SURG TRANSPORE 1 IN (GAUZE/BANDAGES/DRESSINGS) ×1 IMPLANT
TAPE SURGICAL TRANSPORE 1 IN (GAUZE/BANDAGES/DRESSINGS) ×1
TOWEL GREEN STERILE FF (TOWEL DISPOSABLE) ×4 IMPLANT
TUBE CONNECTING 20X1/4 (TUBING) IMPLANT
UNDERPAD 30X36 HEAVY ABSORB (UNDERPADS AND DIAPERS) ×2 IMPLANT

## 2018-12-14 NOTE — Op Note (Signed)
PREOPERATIVE DIAGNOSIS: Right thumb open distal phalanx fracture and nailbed laceration  POSTOPERATIVE DIAGNOSIS: Same  ATTENDING PHYSICIAN: Maudry Mayhew. Jeannie Fend, III, MD who was present and scrubbed for the entire case   ASSISTANT SURGEON: None.   ANESTHESIA: Local  SURGICAL PROCEDURES: 1.  Irrigation debridement of open fracture including skin, subcutaneous tissue and bone 2.  Percutaneous pin fixation of the right thumb distal phalanx fracture 3.  Right thumb nailbed repair    SURGICAL INDICATIONS: Patient is a 63 year old female who was seen and evaluated by me in clinic.  Last week she was mowing her lawn when the lawnmower hit a cable.  The cable then subsequently broke and came across and lacerated the dorsal aspect of the thumb.  She was seen in the ER where she was found to have a nailbed laceration as well as a fracture to the distal phalanx.  She was cleansed and her wounds were temporarily closed.  She was also placed on oral antibiotics.  She was then subsequently sent to see me in clinic.  After further evaluated her thumb I did recommend proceeding forward with fixation of her fracture as well as nailbed repair and she presents today for that.  FINDING: There is a complete laceration of the nailbed at the junction of the nailbed to the germinal matrix.  This extended down to an open fracture of the distal phalanx.  Following irrigation debridement near anatomic alignment of the fracture was achieved with stable fixation using a 0.045 K wire.  Additionally the nailbed was successfully repaired in near-anatomic alignment  DESCRIPTION OF PROCEDURE: The patient was identified in the preoperative holding area where the risk benefits and alternatives of the procedure were once again discussed with the patient.  These include but are not limited to infection, bleeding, damage to surrounding structures including blood vessels and nerves, pain, stiffness, nail deformity, malunion, nonunion  and need for additional procedures.  Informed consent was obtained the patient's right thumb was marked with surgical marking pen.  A combination of 1% lidocaine and half percent bupivacaine were then injected around the base of the thumb.  The patient received full anesthetic to the thumb throughout the procedure.  The patient was then brought to the operative suite where a timeout was performed identifying the correct patient operative site.  She was positioned supine on the operative table with her hand outstretched on a hand table.  The right upper extremity was then prepped and draped in usual sterile fashion.  A Penrose drain was placed around the base of the thumb to act as a digital tourniquet.  The prior sutures were all removed.  Upon debriding the skin there was found to be full laceration through the nail plate which extended down to the distal phalanx.  The lacerated nail plate was removed in its entirety.  The skin, subcutaneous tissue and deep distal phalanx fracture were all debrided using rondure and curette.  The wound was then copiously irrigated with normal saline.  Under direct visualization the distal phalanx fracture was reduced and a 0.045 K wire was advanced across the fracture site and securing it by crossing the IP joint.  Fluoroscopic images were obtained including both PA and lateral images and showed appropriate pin position in near-anatomic alignment of the fracture.  Attention was then turned back to the nail bed.  Multiple interrupted 5-0 plain gut sutures were placed approximating the laceration through the nailbed.  The laceration did extend along the ulnar aspect of the pulp and  4-0 Prolene sutures were placed in interrupted fashion approximating the skin edges.  The wounds were then once again irrigated and a piece of Adaptic was placed on the nailbed underneath the proximal eponychial fold.  Xeroform, 4 x 4's and a well-padded thumb splint were placed.  The digital tourniquet  was released and the patient had return of brisk capillary refill to her digits prior to dressing application.  She tolerated the procedure well and there were no complications.  RADIOGRAPHIC INTERPRETATION: PA and lateral intraoperative fluoroscopic images were obtained.  These show near-anatomic alignment of the previously displaced distal phalanx fracture of the thumb.  There is a single K wire crossing the fracture site as well as the IP joint.  No additional fractures or dislocations are noted.  ESTIMATED BLOOD LOSS: Less than 10 mL  TOURNIQUET TIME: 30 minutes  SPECIMENS: None  POSTOPERATIVE PLAN: The patient will be discharged home and seen back  in the office in approximately 10-12 days for wound check, suture  removal, and then be sent to a therapist for a digital splint as well as edema control and some gentle range of motion exercises  IMPLANTS: 0.045 K wire x1

## 2018-12-14 NOTE — Discharge Instructions (Signed)
Discharge Instructions  - Keep dressings in place. Do not remove them. - The dressings must stay dry - Take all medication as prescribed. Transition to over the counter pain medication as your pain improves - Keep the hand elevated over the next 48-72 hours to help with pain and swelling - Move all digits not restricted by the dressings regularly to prevent stiffness - Please call to schedule a follow up appointment with Dr. Jeannie Fend at [phone] for 10 days following surgery - Your pain medication have been send digitally to your pharmacy

## 2018-12-14 NOTE — H&P (Signed)
ORTHOPAEDIC H&P  PCP:  Unk Pinto, MD  Chief Complaint: Right thumb laceration  HPI: Ruth Ward is a 63 y.o. female who complains of right thumb laceration.  She was mowing her lawn which she hit a table which was cut and hit the dorsal aspect of the right thumb.  She sustained a laceration as well as fracture to the distal phalanx.  This laceration involves the nailbed and plate.  She was initially seen and treated at the ER where her wounds were cleansed and dressed.  She was then seen by me in clinic where I did recommend proceeding forward with operative fixation of her thumb and she presents today for that.  Past Medical History:  Diagnosis Date  . Allergy   . Anxiety   . Climacteric   . Depression   . Fibrocystic breast   . Hypercholesteremia   . Hyperlipidemia   . Hypertension   . Thumb laceration, right, initial encounter   . Vitamin D deficiency    Past Surgical History:  Procedure Laterality Date  . ABDOMINAL SURGERY  09/2016   "tummy tuck"  . [place]  . TUBAL LIGATION     Social History   Socioeconomic History  . Marital status: Married    Spouse name: Not on file  . Number of children: Not on file  . Years of education: Not on file  . Highest education level: Not on file  Occupational History  . Not on file  Social Needs  . Financial resource strain: Not on file  . Food insecurity    Worry: Not on file    Inability: Not on file  . Transportation needs    Medical: Not on file    Non-medical: Not on file  Tobacco Use  . Smoking status: Former Smoker    Quit date: 02/15/1986    Years since quitting: 32.8  . Smokeless tobacco: Never Used  Substance and Sexual Activity  . Alcohol use: No  . Drug use: No  . Sexual activity: Not on file  Lifestyle  . Physical activity    Days per week: Not on file    Minutes per session: Not on file  . Stress: Not on file  Relationships  . Social Herbalist on phone:  Not on file    Gets together: Not on file    Attends religious service: Not on file    Active member of club or organization: Not on file    Attends meetings of clubs or organizations: Not on file    Relationship status: Not on file  Other Topics Concern  . Not on file  Social History Narrative  . Not on file   Family History  Problem Relation Age of Onset  . Diabetes Other   . Hypertension Mother   . Diabetes Mother   . Hypertension Father   . Heart disease Father   . Cancer Father   . Prostate cancer Father   . Hypertension Brother   . Diabetes Brother   . Diabetes Brother   . Colon cancer Neg Hx    Allergies  Allergen Reactions  . Atorvastatin Nausea Only   Prior to Admission medications   Medication Sig Start Date End Date Taking? Authorizing Provider  aspirin 81 MG tablet Take 81 mg by mouth daily.   Yes [provider]  atenolol (TENORMIN) 25 MG tablet Take 1 tablet Daily for BP 08/14/18  Yes Unk Pinto,  MD  cephALEXin (KEFLEX) 500 MG capsule Take 1 capsule (500 mg total) by mouth 4 (four) times daily. 12/09/18  Yes Lawyer, Harrell Gave, PA-C  Cholecalciferol (VITAMIN D) 2000 units tablet Take 1 tablet 2 x (10,000 u)  /day 10/13/17  Yes Unk Pinto, MD  hydrochlorothiazide (HYDRODIURIL) 25 MG tablet Take 1 tablet Daily for BP &Fluid 09/25/18  Yes Unk Pinto, MD  HYDROcodone-acetaminophen (NORCO/VICODIN) 5-325 MG tablet Take 1 tablet by mouth every 6 (six) hours as needed for moderate pain. 12/09/18  Yes Lawyer, Harrell Gave, PA-C  Multiple Vitamin (MULTIVITAMIN WITH MINERALS) TABS tablet Take 1 tablet by mouth daily.   Yes [provider]  pravastatin (PRAVACHOL) 40 MG tablet Take 1 tablet at Bedtime for Cholesterol 08/14/18  Yes Unk Pinto, MD  sertraline (ZOLOFT) 100 MG tablet Take 1 tablet by mouth once daily 09/25/18  Yes Liane Comber, NP   No results found.  Positive ROS: All other systems have been reviewed and were otherwise  negative with the exception of those mentioned in the HPI and as above.  Physical Exam: General: Alert, no acute distress Cardiovascular: No pedal edema Respiratory: No cyanosis, no use of accessory musculature Skin: No lesions in the area of chief complaint Psychiatric: Patient is competent for consent with normal mood and affect Lymphatic: No axillary or cervical lymphadenopathy  MUSCULOSKELETAL: Laceration along the dorsal aspect of the thumb. It involved the nail plate and bed. Sutures are intact along the volar thumb. TTP to the distal phalanx. The tip of the thumb is warm and well perfused. Sensation is intact to radial and ulnar aspects of the thumb. There is minimal swelling but no erythema, lymphadenopathy or signs of infection  Assessment: Right right thumb laceration with distal phalanx fracture and nailbed laceration  Plan: Plan to proceed to the OR for irrigation treatment of her open fracture, fixation of the distal phalanx and nail bed. Risks of surgery were once again discussed with her which include but are not limited to infection, bleeding, damage to surrounding structures including blood vessels nerves, pain, stiffness and need for additional procedures.  Informed consent was obtained.  Patient's right hand was marked. Plan for discharge home postoperatively and follow-up with me in approximate 10 to 14 days.    Verner Mould, MD [phone]   12/14/2018 1:03 PM

## 2018-12-18 ENCOUNTER — Encounter (HOSPITAL_BASED_OUTPATIENT_CLINIC_OR_DEPARTMENT_OTHER): Payer: Self-pay | Admitting: Orthopaedic Surgery

## 2018-12-26 ENCOUNTER — Encounter: Payer: Self-pay | Admitting: Internal Medicine

## 2018-12-26 NOTE — Patient Instructions (Signed)

## 2018-12-26 NOTE — Progress Notes (Signed)
Annual Screening/Preventative Visit & Comprehensive Evaluation &  Examination     This very nice 63 y.o. MWF presents for a Screening /Preventative Visit & comprehensive evaluation and management of multiple medical co-morbidities.  Patient has been followed for HTN, HLD, screen for Prediabetes  and Vitamin D Deficiency.      HTN predates since 2000. Patient's BP has been controlled at home and patient denies any cardiac symptoms as chest pain, palpitations, shortness of breath, dizziness or ankle swelling. Today's BP is at goal - 108/60.      Patient's hyperlipidemia is controlled with diet and medications. Patient denies myalgias or other medication SE's. Last lipids were at goal albeit elevated Trig's:  Lab Results  Component Value Date   CHOL 193 01/18/2018   HDL 57 01/18/2018   LDLCALC 96 01/18/2018   TRIG 282 (H) 01/18/2018   CHOLHDL 3.4 01/18/2018      Patient has mild obesity (BMI 30+) and is monitored expectantly for glucose intolerance  and patient denies reactive hypoglycemic symptoms, visual blurring, diabetic polys or paresthesias. Last A1c was Normal & at goal:  Lab Results  Component Value Date   HGBA1C 5.5 01/18/2018      Finally, patient has history of Vitamin D Deficiency  ("43" /on Tx /2008) and last Vitamin D was at goal:  Lab Results  Component Value Date   VD25OH 98 01/18/2018   Current Outpatient Medications on File Prior to Visit  Medication Sig  . acetaminophen (TYLENOL) 500 MG tablet Take 1,000 mg by mouth every 6 (six) hours as needed for mild pain or moderate pain.  Marland Kitchen aspirin 81 MG tablet Take 81 mg by mouth daily.  Marland Kitchen atenolol (TENORMIN) 25 MG tablet Take 1 tablet Daily for BP  . cephALEXin (KEFLEX) 500 MG capsule Take 1 capsule (500 mg total) by mouth 4 (four) times daily.  . Cholecalciferol (VITAMIN D) 2000 units tablet Take 1 tablet 2 x (10,000 u)  /day  . hydrochlorothiazide (HYDRODIURIL) 25 MG tablet Take 1 tablet Daily for BP &Fluid  . Multiple  Vitamin (MULTIVITAMIN WITH MINERALS) TABS tablet Take 1 tablet by mouth daily.  . pravastatin (PRAVACHOL) 40 MG tablet Take 1 tablet at Bedtime for Cholesterol  . sertraline (ZOLOFT) 100 MG tablet Take 1 tablet by mouth once daily   No current facility-administered medications on file prior to visit.    Allergies  Allergen Reactions  . Atorvastatin Nausea Only   Past Medical History:  Diagnosis Date  . Allergy   . Anxiety   . Climacteric   . Depression   . Fibrocystic breast   . Hypercholesteremia   . Hyperlipidemia   . Hypertension   . Thumb laceration, right, initial encounter   . Vitamin D deficiency    Health Maintenance  Topic Date Due  . MAMMOGRAM  09/01/2017  . INFLUENZA VACCINE  09/16/2018  . DEXA SCAN  12/27/2018  . PAP SMEAR-Modifier  12/02/2018  . PNA vac Low Risk Adult (1 of 2 - PCV13) 12/27/2018  . COLONOSCOPY  04/15/2020  . TETANUS/TDAP  03/02/2022  . Hepatitis C Screening  Completed  . HIV Screening  Completed   Immunization History  Administered Date(s) Administered  . Influenza-Unspecified 11/15/2012, 12/19/2017  . PPD Test 03/07/2013, 05/02/2014, 05/22/2015, 10/10/2017  . Pneumococcal-Unspecified 02/15/2009  . Td 02/15/2002  . Tdap 03/02/2012  . Zoster 12/02/2015   Last Colon -  04/15/2017 - Dr Fuller Plan recc 3 yr f/u due Mar 2022  [facility]  Past Surgical History:  Procedure Laterality Date  . ABDOMINAL SURGERY  09/2016   "tummy tuck"  . [place]  . I&D EXTREMITY Right 12/14/2018   Procedure: Right thumb nailbed repair, distal phalanx irrigation and debridement and closed reduction with percutaneous pinning;  Surgeon: Verner Mould, MD;  Location: [facility];  Service: Orthopedics;  Laterality: Right;  thumb   . TUBAL LIGATION     Family History  Problem Relation Age of Onset  . Diabetes Other   . Hypertension Mother   . Diabetes Mother   . Hypertension Father   .  Heart disease Father   . Cancer Father   . Prostate cancer Father   . Hypertension Brother   . Diabetes Brother   . Diabetes Brother   . Colon cancer Neg Hx    Social History   Tobacco Use  . Smoking status: Former Smoker    Quit date: 02/15/1986    Years since quitting: 32.8  . Smokeless tobacco: Never Used  Substance Use Topics  . Alcohol use: No  . Drug use: No    ROS Constitutional: Denies fever, chills, weight loss/gain, headaches, insomnia,  night sweats, and change in appetite. Does c/o fatigue. Eyes: Denies redness, blurred vision, diplopia, discharge, itchy, watery eyes.  ENT: Denies discharge, congestion, post nasal drip, epistaxis, sore throat, earache, hearing loss, dental pain, Tinnitus, Vertigo, Sinus pain, snoring.  Cardio: Denies chest pain, palpitations, irregular heartbeat, syncope, dyspnea, diaphoresis, orthopnea, PND, claudication, edema Respiratory: denies cough, dyspnea, DOE, pleurisy, hoarseness, laryngitis, wheezing.  Gastrointestinal: Denies dysphagia, heartburn, reflux, water brash, pain, cramps, nausea, vomiting, bloating, diarrhea, constipation, hematemesis, melena, hematochezia, jaundice, hemorrhoids Genitourinary: Denies dysuria, frequency, urgency, nocturia, hesitancy, discharge, hematuria, flank pain Breast: Breast lumps, nipple discharge, bleeding.  Musculoskeletal: Denies arthralgia, myalgia, stiffness, Jt. Swelling, pain, limp, and strain/sprain. Denies falls. Skin: Denies puritis, rash, hives, warts, acne, eczema, changing in skin lesion Neuro: No weakness, tremor, incoordination, spasms, paresthesia, pain Psychiatric: Denies confusion, memory loss, sensory loss. Denies Depression. Endocrine: Denies change in weight, skin, hair change, nocturia, and paresthesia, diabetic polys, visual blurring, hyper / hypo glycemic episodes.  Heme/Lymph: No excessive bleeding, bruising, enlarged lymph nodes.  Physical Exam  BP 108/60   Pulse 60   Temp (!) 97.4  F (36.3 C)   Resp 16   Ht 5' 6.5" (1.689 m)   Wt 162 lb 6.4 oz (73.7 kg)   BMI 25.82 kg/m   General Appearance: Well nourished, well groomed and in no apparent distress.  Eyes: PERRLA, EOMs, conjunctiva no swelling or erythema, normal fundi and vessels. Sinuses: No frontal/maxillary tenderness ENT/Mouth: EACs patent / TMs  nl. Nares clear without erythema, swelling, mucoid exudates. Oral hygiene is good. No erythema, swelling, or exudate. Tongue normal, non-obstructing. Tonsils not swollen or erythematous. Hearing normal.  Neck: Supple, thyroid not palpable. No bruits, nodes or JVD. Respiratory: Respiratory effort normal.  BS equal and clear bilateral without rales, rhonci, wheezing or stridor. Cardio: Heart sounds are normal with regular rate and rhythm and no murmurs, rubs or gallops. Peripheral pulses are normal and equal bilaterally without edema. No aortic or femoral bruits. Chest: symmetric with normal excursions and percussion. Breasts: Symmetric, without lumps, nipple discharge, retractions, or fibrocystic changes.  Abdomen: Flat, soft with bowel sounds active. Nontender, no guarding, rebound, hernias, masses, or organomegaly.  Lymphatics: Non tender without lymphadenopathy.  Genitourinary:  Musculoskeletal: Full ROM all peripheral extremities, joint stability, 5/5 strength, and normal  gait. Skin: Warm and dry without rashes, lesions, cyanosis, clubbing or  ecchymosis.  Neuro: Cranial nerves intact, reflexes equal bilaterally. Normal muscle tone, no cerebellar symptoms. Sensation intact.  Pysch: Alert and oriented X 3, normal affect, Insight and Judgment appropriate.   Assessment and Plan  1. Annual Preventative Screening Examination  2. Essential hypertension  - EKG 12-Lead - Urinalysis, Routine w reflex microscopic - Microalbumin / Creatinine Urine Ratio - CBC with Diff - COMPLETE METABOLIC PANEL WITH GFR - Magnesium - TSH  3. Hyperlipidemia, mixed  - EKG 12-Lead  - Lipid Profile - TSH  4. Abnormal glucose  - EKG 12-Lead - Hemoglobin A1c ([facility]) - Insulin, random  5. Vitamin D deficiency  - Vitamin D (25 hydroxy)  6. Screening for colorectal cancer  - POC Hemoccult Bld/Stl  7. Screening for ischemic heart disease  - EKG 12-Lead  8. FHx: heart disease  - EKG 12-Lead  9. Former smoker  - EKG 12-Lead  10. Medication management  - Urinalysis, Routine w reflex microscopic - Microalbumin / Creatinine Urine Ratio - CBC with Diff - COMPLETE METABOLIC PANEL WITH GFR - Magnesium - Lipid Profile - TSH - Hemoglobin A1c ([facility]) - Insulin, random - Vitamin D (25 hydroxy)       Patient was counseled in prudent diet to achieve/maintain BMI less than 25 for weight control, BP monitoring, regular exercise and medications. Discussed med's effects and SE's. Screening labs and tests as requested with regular follow-up as recommended. Over 40 minutes of exam, counseling, chart review and high complex critical decision making was performed.   Kirtland Bouchard, MD

## 2018-12-27 ENCOUNTER — Other Ambulatory Visit: Payer: Self-pay

## 2018-12-27 ENCOUNTER — Ambulatory Visit (INDEPENDENT_AMBULATORY_CARE_PROVIDER_SITE_OTHER): Payer: Medicare Other | Admitting: Internal Medicine

## 2018-12-27 VITALS — BP 108/60 | HR 60 | Temp 97.4°F | Resp 16 | Ht 66.5 in | Wt 162.4 lb

## 2018-12-27 DIAGNOSIS — Z23 Encounter for immunization: Secondary | ICD-10-CM | POA: Diagnosis not present

## 2018-12-27 DIAGNOSIS — R7309 Other abnormal glucose: Secondary | ICD-10-CM | POA: Diagnosis not present

## 2018-12-27 DIAGNOSIS — Z136 Encounter for screening for cardiovascular disorders: Secondary | ICD-10-CM | POA: Diagnosis not present

## 2018-12-27 DIAGNOSIS — Z1211 Encounter for screening for malignant neoplasm of colon: Secondary | ICD-10-CM

## 2018-12-27 DIAGNOSIS — Z0000 Encounter for general adult medical examination without abnormal findings: Secondary | ICD-10-CM

## 2018-12-27 DIAGNOSIS — E559 Vitamin D deficiency, unspecified: Secondary | ICD-10-CM | POA: Diagnosis not present

## 2018-12-27 DIAGNOSIS — Z0001 Encounter for general adult medical examination with abnormal findings: Secondary | ICD-10-CM

## 2018-12-27 DIAGNOSIS — I10 Essential (primary) hypertension: Secondary | ICD-10-CM

## 2018-12-27 DIAGNOSIS — Z8249 Family history of ischemic heart disease and other diseases of the circulatory system: Secondary | ICD-10-CM | POA: Diagnosis not present

## 2018-12-27 DIAGNOSIS — E782 Mixed hyperlipidemia: Secondary | ICD-10-CM | POA: Diagnosis not present

## 2018-12-27 DIAGNOSIS — Z87891 Personal history of nicotine dependence: Secondary | ICD-10-CM

## 2018-12-27 DIAGNOSIS — Z79899 Other long term (current) drug therapy: Secondary | ICD-10-CM

## 2018-12-28 LAB — URINALYSIS, ROUTINE W REFLEX MICROSCOPIC
Bilirubin Urine: NEGATIVE
Glucose, UA: NEGATIVE
Hgb urine dipstick: NEGATIVE
Ketones, ur: NEGATIVE
Leukocytes,Ua: NEGATIVE
Nitrite: NEGATIVE
Protein, ur: NEGATIVE
Specific Gravity, Urine: 1.018 (ref 1.001–1.03)
pH: 7 (ref 5.0–8.0)

## 2018-12-28 LAB — CBC WITH DIFFERENTIAL/PLATELET
Absolute Monocytes: 360 {cells}/uL (ref 200–950)
Basophils Absolute: 50 {cells}/uL (ref 0–200)
Basophils Relative: 1 %
Eosinophils Absolute: 50 {cells}/uL (ref 15–500)
Eosinophils Relative: 1 %
HCT: 44.3 % (ref 35.0–45.0)
Hemoglobin: 15.1 g/dL (ref 11.7–15.5)
Lymphs Abs: 1860 {cells}/uL (ref 850–3900)
MCH: 32.3 pg (ref 27.0–33.0)
MCHC: 34.1 g/dL (ref 32.0–36.0)
MCV: 94.7 fL (ref 80.0–100.0)
MPV: 10.5 fL (ref 7.5–12.5)
Monocytes Relative: 7.2 %
Neutro Abs: 2680 {cells}/uL (ref 1500–7800)
Neutrophils Relative %: 53.6 %
Platelets: 253 Thousand/uL (ref 140–400)
RBC: 4.68 Million/uL (ref 3.80–5.10)
RDW: 12.6 % (ref 11.0–15.0)
Total Lymphocyte: 37.2 %
WBC: 5 Thousand/uL (ref 3.8–10.8)

## 2018-12-28 LAB — COMPLETE METABOLIC PANEL WITHOUT GFR
AG Ratio: 2.1 (calc) (ref 1.0–2.5)
ALT: 26 U/L (ref 6–29)
AST: 21 U/L (ref 10–35)
Albumin: 4.5 g/dL (ref 3.6–5.1)
Alkaline phosphatase (APISO): 73 U/L (ref 37–153)
BUN: 19 mg/dL (ref 7–25)
CO2: 30 mmol/L (ref 20–32)
Calcium: 9.8 mg/dL (ref 8.6–10.4)
Chloride: 102 mmol/L (ref 98–110)
Creat: 0.57 mg/dL (ref 0.50–0.99)
GFR, Est African American: 113 mL/min/1.73m2
GFR, Est Non African American: 97 mL/min/1.73m2
Globulin: 2.1 g/dL (ref 1.9–3.7)
Glucose, Bld: 89 mg/dL (ref 65–99)
Potassium: 4 mmol/L (ref 3.5–5.3)
Sodium: 140 mmol/L (ref 135–146)
Total Bilirubin: 0.4 mg/dL (ref 0.2–1.2)
Total Protein: 6.6 g/dL (ref 6.1–8.1)

## 2018-12-28 LAB — LIPID PANEL
Cholesterol: 155 mg/dL
HDL: 47 mg/dL — ABNORMAL LOW
LDL Cholesterol (Calc): 80 mg/dL
Non-HDL Cholesterol (Calc): 108 mg/dL
Total CHOL/HDL Ratio: 3.3 (calc)
Triglycerides: 188 mg/dL — ABNORMAL HIGH

## 2018-12-28 LAB — VITAMIN D 25 HYDROXY (VIT D DEFICIENCY, FRACTURES): Vit D, 25-Hydroxy: >150 ng/mL — ABNORMAL HIGH (ref 30–100)

## 2018-12-28 LAB — MICROALBUMIN / CREATININE URINE RATIO
Creatinine, Urine: 65 mg/dL (ref 20–275)
Microalb Creat Ratio: 5 mcg/mg creat (ref <30)
Microalb, Ur: 0.3 mg/dL

## 2018-12-28 LAB — HEMOGLOBIN A1C
Hgb A1c MFr Bld: 5.3 %{Hb}
Mean Plasma Glucose: 105 (calc)
eAG (mmol/L): 5.8 (calc)

## 2018-12-28 LAB — MAGNESIUM: Magnesium: 2.2 mg/dL (ref 1.5–2.5)

## 2018-12-28 LAB — TSH: TSH: 1.86 mIU/L (ref 0.40–4.50)

## 2018-12-28 LAB — INSULIN, RANDOM: Insulin: 3.5 u[IU]/mL

## 2018-12-31 ENCOUNTER — Encounter: Payer: Self-pay | Admitting: Internal Medicine

## 2019-01-04 ENCOUNTER — Other Ambulatory Visit: Payer: Self-pay | Admitting: Internal Medicine

## 2019-01-04 DIAGNOSIS — Z1231 Encounter for screening mammogram for malignant neoplasm of breast: Secondary | ICD-10-CM

## 2019-01-26 IMAGING — CT CT ABD-PELV W/ CM
2 of 5 series · 16 of 46 positions shown, 18 images · IV contrast (iopamidol)
Comparison: None.

CLINICAL DATA: Diarrhea beginning last night with some blood in
stool today. Intermittent abdominal pain and low back pain.

EXAM:
CT ABDOMEN AND PELVIS WITH CONTRAST
TECHNIQUE: Multidetector CT imaging of the abdomen and pelvis was performed
using the standard protocol following bolus administration of
intravenous contrast.
CONTRAST:  100mL [id number] IOPAMIDOL ([id number]) INJECTION 61%

[Series 3: a/p w/ 5mm · axial · 0.98mm/px · z∈[+762,+1202]mm · 13 of 100 slices shown, 15 images]
[im 6/100  soft-tissue]
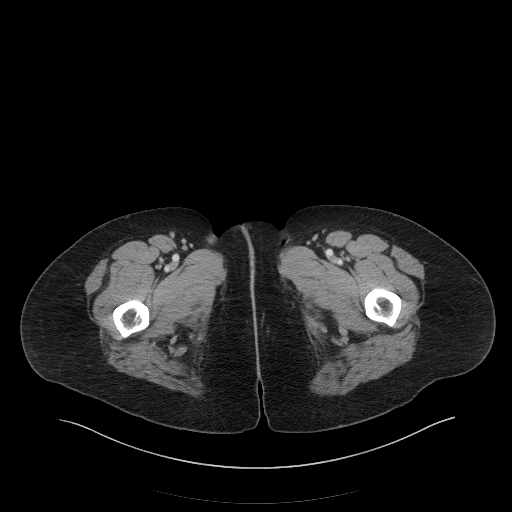
[im 6/100  bone]
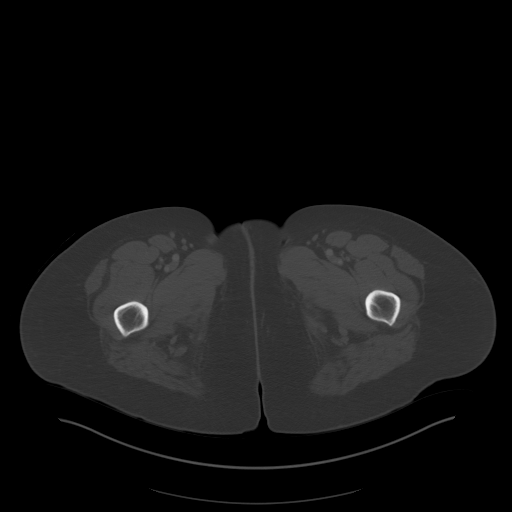
[im 12/100  soft-tissue]
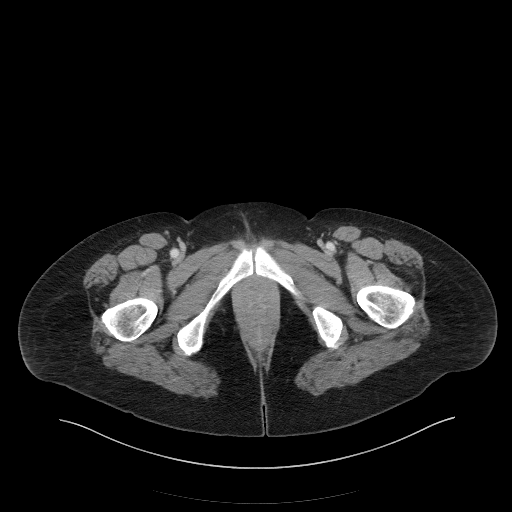
[im 23/100  soft-tissue]
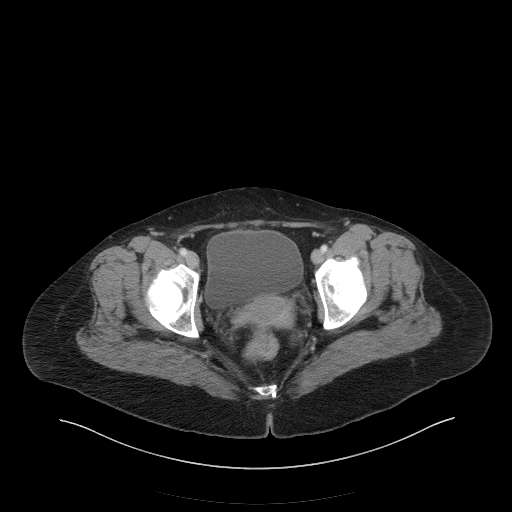
[im 28/100  soft-tissue]
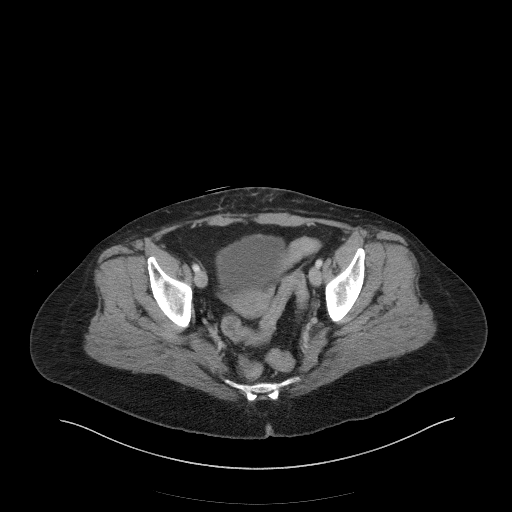
[im 34/100  soft-tissue]
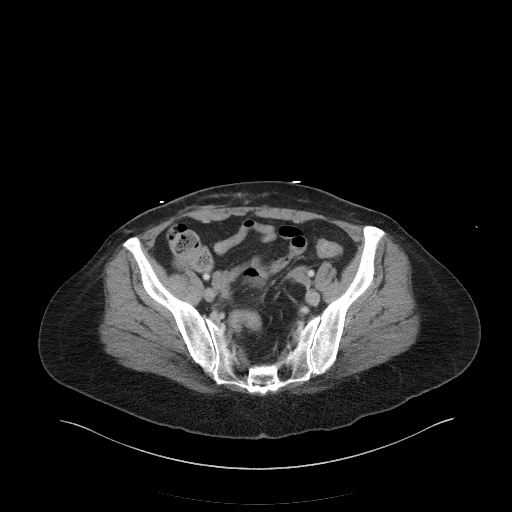
[im 45/100  soft-tissue]
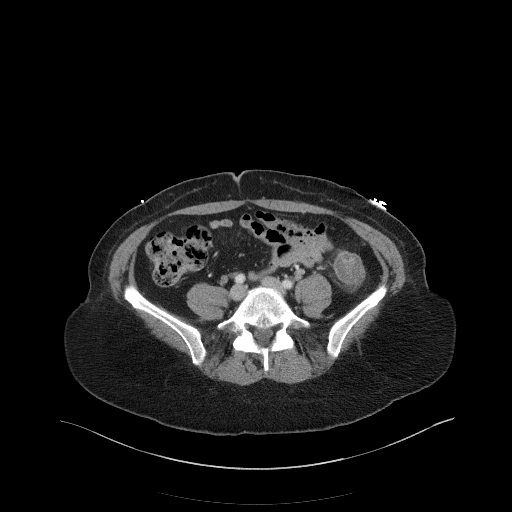
[im 50/100  soft-tissue]
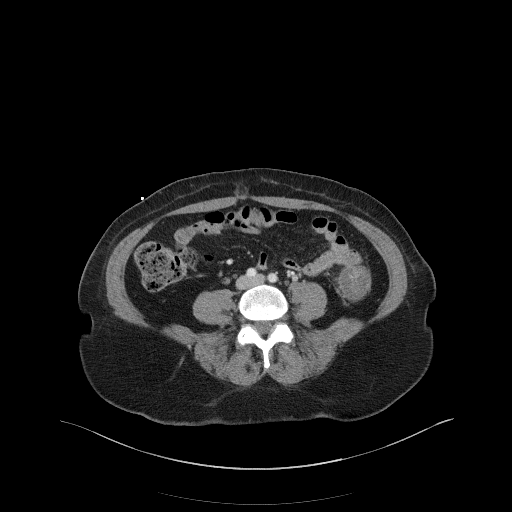
[im 56/100  soft-tissue]
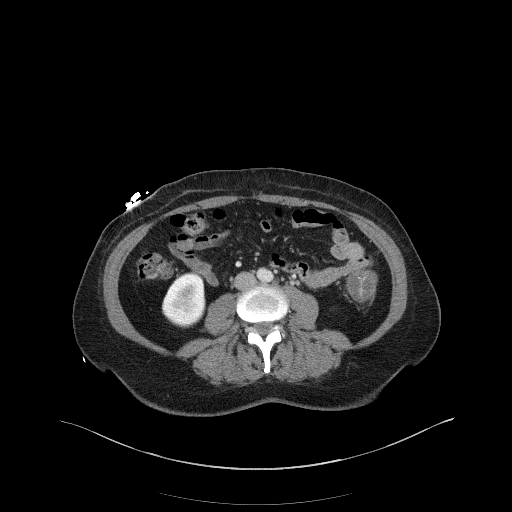
[im 67/100  soft-tissue]
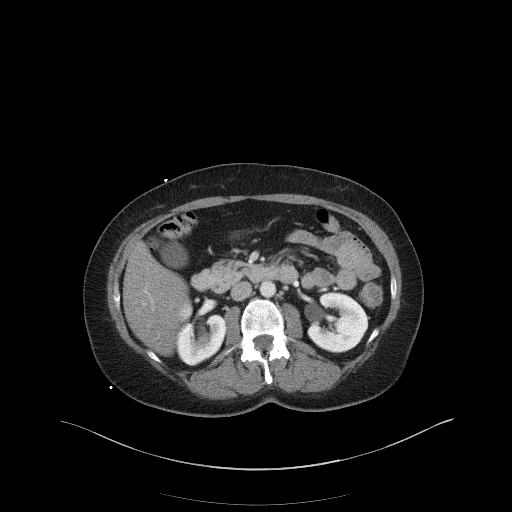
[im 67/100  bone]
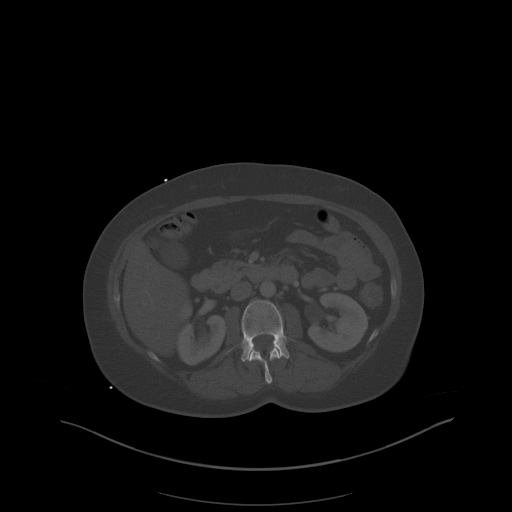
[im 72/100  soft-tissue]
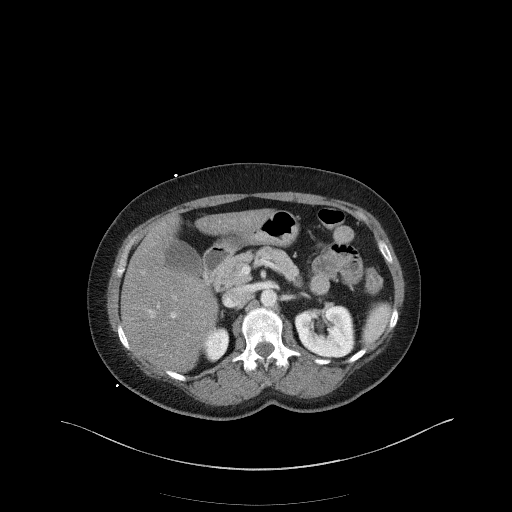
[im 78/100  soft-tissue]
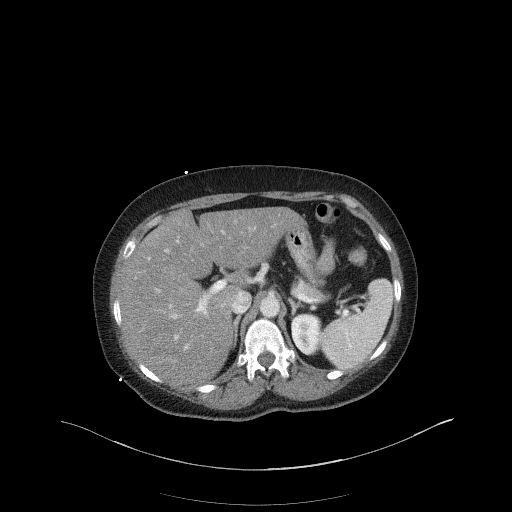
[im 89/100  soft-tissue]
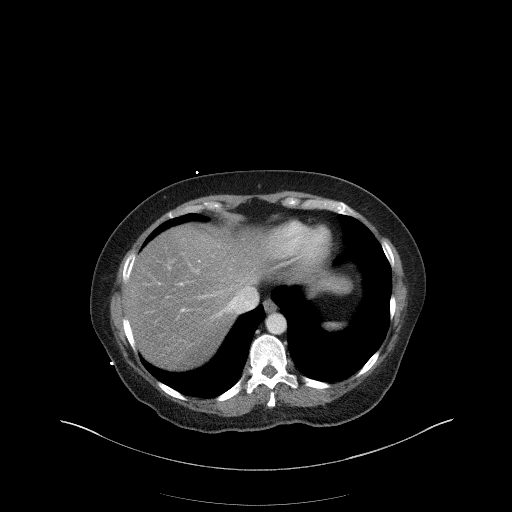
[im 94/100  soft-tissue]
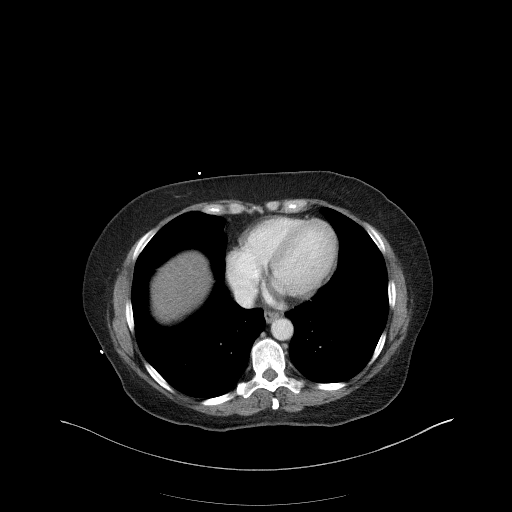

[Series 6: a/p w/ cor · coronal · 0.75mm/px · 3 of 141 slices shown]
[im 47/141  soft-tissue]
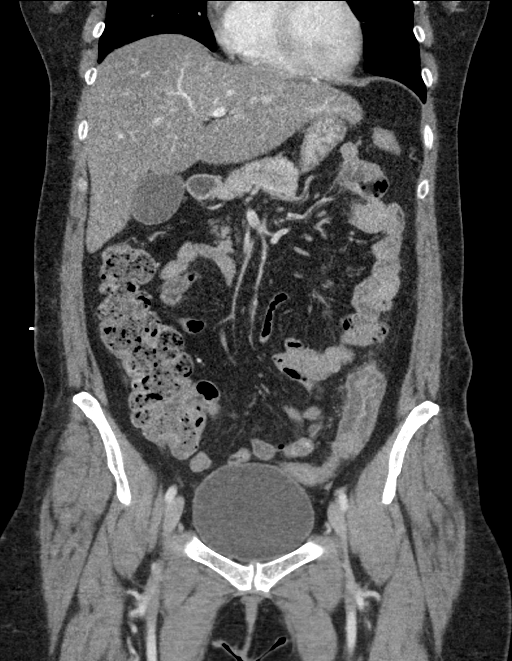
[im 63/141  soft-tissue]
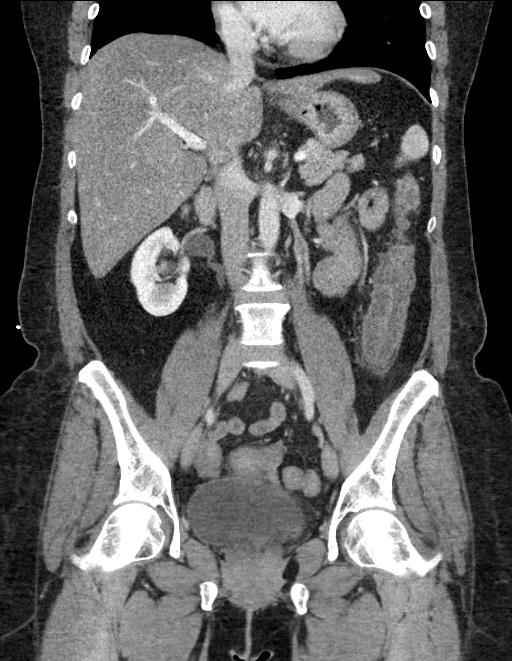
[im 78/141  soft-tissue]
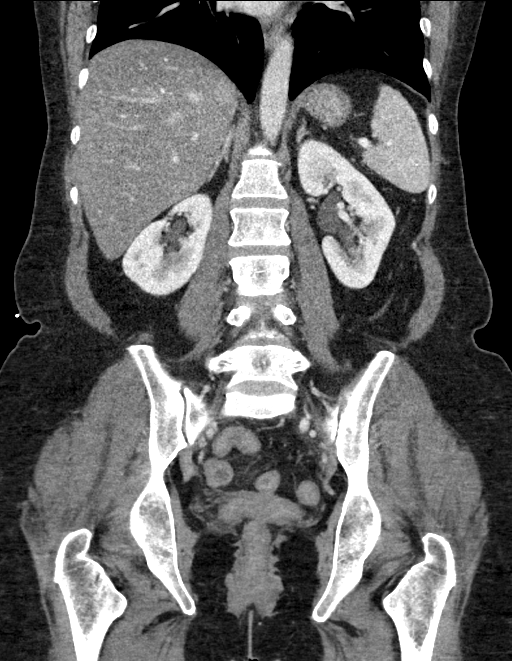

[16 of 46 positions shown; findings below may reference images not displayed]

FINDINGS: Lower chest: Lung bases are normal.

Hepatobiliary: Mild diffuse low-attenuation of the liver without
focal mass. Gallbladder and biliary tree are normal.

Pancreas: Normal.

Spleen: Normal.

Adrenals/Urinary Tract: Adrenal glands are normal. Kidneys are
normal in size without nephrolithiasis or focal mass. There is mild
prominence of the intrarenal collecting systems right greater than
left. Ureters are normal. Bladder is normal.

Stomach/Bowel: Stomach and small bowel are within normal. Appendix
is normal. There is moderate wall thickening involving the
descending colon with minimal adjacent pericolonic
inflammation/fluid compatible with acute colitis.

Vascular/Lymphatic: Within normal.

Reproductive: Within normal.

Other: None.

Musculoskeletal: Within normal.
IMPRESSION: Evidence of acute colitis involving the descending colon likely of
infectious or inflammatory nature.

## 2019-01-27 ENCOUNTER — Other Ambulatory Visit: Payer: Self-pay | Admitting: Adult Health

## 2019-02-06 DIAGNOSIS — S61011D Laceration without foreign body of right thumb without damage to nail, subsequent encounter: Secondary | ICD-10-CM | POA: Diagnosis not present

## 2019-03-02 ENCOUNTER — Ambulatory Visit
Admission: RE | Admit: 2019-03-02 | Discharge: 2019-03-02 | Disposition: A | Discharge status: Home / Self Care | Payer: Medicare Other | Source: Ambulatory Visit | Attending: Internal Medicine | Admitting: Internal Medicine

## 2019-03-02 ENCOUNTER — Other Ambulatory Visit: Payer: Self-pay

## 2019-03-02 DIAGNOSIS — Z1231 Encounter for screening mammogram for malignant neoplasm of breast: Secondary | ICD-10-CM

## 2019-03-26 ENCOUNTER — Ambulatory Visit: Payer: Medicare Other

## 2019-04-04 DIAGNOSIS — Z86010 Personal history of colonic polyps: Secondary | ICD-10-CM | POA: Insufficient documentation

## 2019-04-04 NOTE — Progress Notes (Deleted)
MEDICARE ANNUAL WELLNESS VISIT AND FOLLOW UP  Assessment:   Diagnoses and all orders for this visit:  Welcome to Medicare preventive visit  Essential hypertension  Fatty liver  Vitamin D deficiency  Other abnormal glucose  Medication management  Hyperlipidemia, unspecified hyperlipidemia type  Depression, major, recurrent, in partial remission ([place])  Encounter for general adult medical examination with abnormal findings  History of adenomatous polyp of colon      Over 40 minutes of exam, counseling, chart review and critical decision making was performed Future Appointments  Date Time Provider [place]  04/05/2019  8:30 AM Liane Comber, NP [facility] None  07/17/2019  9:30 AM Unk Pinto, MD [facility] None  01/15/2020 11:00 AM Unk Pinto, MD [facility] None     Plan:   During the course of the visit the patient was educated and counseled about appropriate screening and preventive services including:    Pneumococcal vaccine   Prevnar 13  Influenza vaccine  Td vaccine  Screening electrocardiogram  Bone densitometry screening  Colorectal cancer screening  Diabetes screening  Glaucoma screening  Nutrition counseling   Advanced directives: requested   Subjective:  Ruth Ward is a 64 y.o. female who presents for Medicare Annual Wellness Visit and 3 month follow up.   She has hx of recurrent major depression currently well controlled by zoloft 100   BMI is There is no height or weight on file to calculate BMI., she {HAS HAS [id number] been working on diet and exercise. She successfully lost weight last year from peak weight 194lb in 01/2018 *** Wt Readings from Last 3 Encounters:  12/27/18 162 lb 6.4 oz (73.7 kg)  12/12/18 162 lb (73.5 kg)  01/18/18 194 lb 3.2 oz (88.1 kg)   Her blood pressure {HAS HAS NOT:18834} been controlled at home, today their BP is   She {DOES_DOES [id number] workout. She denies chest pain,  shortness of breath, dizziness.   She is on cholesterol medication (pravastatin 40 mg daily) and denies myalgias. Her cholesterol is at goal of LDL <100. The cholesterol last visit was:   Lab Results  Component Value Date   CHOL 155 12/27/2018   HDL 47 (L) 12/27/2018   LDLCALC 80 12/27/2018   TRIG 188 (H) 12/27/2018   CHOLHDL 3.3 12/27/2018    She {Has/has not:18111} been working on diet and exercise for glucose management, and denies paresthesia of the feet, polydipsia, polyuria, visual disturbances, vomiting and weight loss. Last A1C in the office was:  Lab Results  Component Value Date   HGBA1C 5.3 12/27/2018   Last GFR: Lab Results  Component Value Date   GFRNONAA 97 12/27/2018   Patient is on Vitamin D supplement.   Lab Results  Component Value Date   VD25OH >150 (H) 12/27/2018      Medication Review: Current Outpatient Medications on File Prior to Visit  Medication Sig Dispense Refill  . acetaminophen (TYLENOL) 500 MG tablet Take 1,000 mg by mouth every 6 (six) hours as needed for mild pain or moderate pain.    Marland Kitchen aspirin 81 MG tablet Take 81 mg by mouth daily.    Marland Kitchen atenolol (TENORMIN) 25 MG tablet Take 1 tablet Daily for BP 90 tablet 3  . cephALEXin (KEFLEX) 500 MG capsule Take 1 capsule (500 mg total) by mouth 4 (four) times daily. 28 capsule 0  . Cholecalciferol (VITAMIN D) 2000 units tablet Take 1 tablet 2 x (10,000 u)  /day    . hydrochlorothiazide (HYDRODIURIL) 25 MG tablet Take 1  tablet Daily for BP &Fluid 90 tablet 3  . Multiple Vitamin (MULTIVITAMIN WITH MINERALS) TABS tablet Take 1 tablet by mouth daily.    . pravastatin (PRAVACHOL) 40 MG tablet Take 1 tablet at Bedtime for Cholesterol 90 tablet 3  . sertraline (ZOLOFT) 100 MG tablet Take 1 tablet Daily for Mood 90 tablet 3   No current facility-administered medications on file prior to visit.    Allergies  Allergen Reactions  . Atorvastatin Nausea Only    Current Problems (verified) Patient Active  Problem List   Diagnosis Date Noted  . BMI 25.0-25.9,adult 01/16/2018  . Fatty liver 03/01/2017  . Depression, major, recurrent, in partial remission ([place]) 05/02/2014  . Other abnormal glucose 10/09/2013  . Medication management 10/09/2013  . Hypertension   . Hyperlipidemia   . Vitamin D deficiency     Screening Tests Immunization History  Administered Date(s) Administered  . Influenza, High Dose Seasonal PF 12/27/2018  . Influenza-Unspecified 11/15/2012, 12/19/2017  . PPD Test 03/07/2013, 05/02/2014, 05/22/2015, 10/10/2017  . Pneumococcal-Unspecified 02/15/2009  . Td 02/15/2002  . Tdap 03/02/2012  . Zoster 12/02/2015   Preventative care: Last colonoscopy: 04/2017, adenomatous polyps, due 3 years Last mammogram: 02/2019 Last pap smear/pelvic exam: 11/2015 ***  DEXA:***  Prior vaccinations: TD or Tdap: 2014  Influenza: 12/2018  Pneumococcal: due 1 year after prevnar  Prevnar13: *** Shingles/Zostavax: 2017, ***  Names of Other Physician/Practitioners you currently use: 1. [facility] here for primary care 2. ***, eye doctor, last visit *** 3. ***, dentist, last visit ***  Patient Care Team: Unk Pinto, MD as PCP - General (Internal Medicine) Irene Shipper, MD as Consulting Physician (Gastroenterology)  SURGICAL HISTORY She  has a past surgical history that includes Cesarean section (1975 and 1980); Tubal ligation; Abdominal surgery (09/2016); and I & D extremity (Right, 12/14/2018). FAMILY HISTORY Her family history includes Cancer in her father; Diabetes in her brother, brother, mother, and another family member; Heart disease in her father; Hypertension in her brother, father, and mother; Prostate cancer in her father. SOCIAL HISTORY She  reports that she quit smoking about 33 years ago. She has never used smokeless tobacco. She reports that she does not drink alcohol or use drugs.   MEDICARE WELLNESS OBJECTIVES: Physical  activity:   Cardiac risk factors:   Depression/mood screen:   Depression screen [facility] 2/9 12/26/2018  Decreased Interest 0  Down, Depressed, Hopeless 0  PHQ - 2 Score 0    ADLs:  In your present state of health, do you have any difficulty performing the following activities: 12/26/2018  Hearing? N  Vision? N  Difficulty concentrating or making decisions? N  Walking or climbing stairs? N  Dressing or bathing? N  Some recent data might be hidden     Cognitive Testing  Alert? Yes  Normal Appearance?Yes  Oriented to person? Yes  Place? Yes   Time? Yes  Recall of three objects?  Yes  Can perform simple calculations? Yes  Displays appropriate judgment?Yes  Can read the correct time from a watch face?Yes  EOL planning:    Review of Systems  Constitutional: Negative for malaise/fatigue and weight loss.  HENT: Negative for hearing loss and tinnitus.   Eyes: Negative for blurred vision and double vision.  Respiratory: Negative for cough, sputum production, shortness of breath and wheezing.   Cardiovascular: Negative for chest pain, palpitations, orthopnea, claudication, leg swelling and PND.  Gastrointestinal: Negative for abdominal pain, blood in stool, constipation, diarrhea, heartburn,  melena, nausea and vomiting.  Genitourinary: Negative.   Musculoskeletal: Negative for falls, joint pain and myalgias.  Skin: Negative for rash.  Neurological: Negative for dizziness, tingling, sensory change, weakness and headaches.  Endo/Heme/Allergies: Negative for polydipsia.  Psychiatric/Behavioral: Negative.  Negative for depression, memory loss, substance abuse and suicidal ideas. The patient is not nervous/anxious and does not have insomnia.   All other systems reviewed and are negative.    Objective:     There were no vitals filed for this visit. There is no height or weight on file to calculate BMI.  General appearance: alert, no distress, WD/WN, female HEENT: normocephalic, sclerae  anicteric, TMs pearly, nares patent, no discharge or erythema, pharynx normal Oral cavity: MMM, no lesions Neck: supple, no lymphadenopathy, no thyromegaly, no masses Heart: RRR, normal S1, S2, no murmurs Lungs: CTA bilaterally, no wheezes, rhonchi, or rales Abdomen: +bs, soft, non tender, non distended, no masses, no hepatomegaly, no splenomegaly Musculoskeletal: nontender, no swelling, no obvious deformity Extremities: no edema, no cyanosis, no clubbing Pulses: 2+ symmetric, upper and lower extremities, normal cap refill Neurological: alert, oriented x 3, CN2-12 intact, strength normal upper extremities and lower extremities, sensation normal throughout, DTRs 2+ throughout, no cerebellar signs, gait normal Psychiatric: normal affect, behavior normal, pleasant   EKG: *** AAA [place]:   Medicare Attestation I have personally reviewed: The patient's medical and social history Their use of alcohol, tobacco or illicit drugs Their current medications and supplements The patient's functional ability including ADLs,fall risks, home safety risks, cognitive, and hearing and visual impairment Diet and physical activities Evidence for depression or mood disorders  The patient's weight, height, BMI, and visual acuity have been recorded in the chart.  I have made referrals, counseling, and provided education to the patient based on review of the above and I have provided the patient with a written personalized care plan for preventive services.     Izora Ribas, NP   04/04/2019

## 2019-04-05 ENCOUNTER — Ambulatory Visit: Payer: Medicare Other | Admitting: Adult Health

## 2019-04-12 ENCOUNTER — Ambulatory Visit: Payer: Medicare Other

## 2019-04-12 NOTE — Progress Notes (Signed)
MEDICARE ANNUAL WELLNESS VISIT AND FOLLOW UP  Assessment:   Ruth Ward was seen today for medicare wellness and follow-up.  Diagnoses and all orders for this visit:  Welcome to Medicare preventive visit Due annually  -     EKG 12-Lead -     [place], RETROPERITNL ABD,  LTD  Essential hypertension Continue medication Monitor blood pressure at home; call if consistently over 130/80 Continue DASH diet.   Reminder to go to the ER if any CP, SOB, nausea, dizziness, severe HA, changes vision/speech, left arm numbness and tingling and jaw pain. -     CBC with Differential/Platelet -     COMPLETE METABOLIC PANEL WITH GFR -     Magnesium -     TSH -     EKG 12-Lead -     [place], RETROPERITNL ABD,  LTD  Fatty liver Maintain normal weight, avoid alcohol/tylenol, will monitor LFTs -     COMPLETE METABOLIC PANEL WITH GFR  Vitamin D deficiency -     VITAMIN D 25 Hydroxy (Vit-D Deficiency, Fractures)  Other abnormal glucose Recent A1Cs at goal Discussed diet/exercise, weight management  Defer A1C; check CMP -     COMPLETE METABOLIC PANEL WITH GFR  Medication management -     CBC with Differential/Platelet -     COMPLETE METABOLIC PANEL WITH GFR -     Magnesium  Hyperlipidemia, unspecified hyperlipidemia type Continue medications Continue low cholesterol diet and exercise.  Check lipid panel.  -     Lipid panel -     TSH  Depression, major, recurrent, in complete remission (H[facility]) In remission on zoloft; has failed attempts to taper; continue med and monitor Lifestyle discussed: diet/exerise, sleep hygiene, stress management, hydration  History of adenomatous polyp of colon UTD on colonoscopies; due 2022  BMI 24.0-24.9,adult Continue to recommend diet heavy in fruits and veggies and low in animal meats, cheeses, and dairy products, appropriate calorie intake Discuss exercise recommendations routinely Continue to monitor weight at each visit  Encounter for general adult medical  examination with abnormal findings -     EKG 12-Lead -     [place], RETROPERITNL ABD,  LTD  Estrogen deficiency -     DG Bone Density; Future   Over 40 minutes of exam, counseling, chart review and critical decision making was performed Future Appointments  Date Time Provider [place]  07/17/2019  9:30 AM Unk Pinto, MD [facility] None  01/15/2020 11:00 AM Unk Pinto, MD [facility] None     Plan:   During the course of the visit the patient was educated and counseled about appropriate screening and preventive services including:    Pneumococcal vaccine   Prevnar 13  Influenza vaccine  Td vaccine  Screening electrocardiogram  Bone densitometry screening  Colorectal cancer screening  Diabetes screening  Glaucoma screening  Nutrition counseling   Advanced directives: requested   Subjective:  Ruth Ward is a 64 y.o. female who presents for Medicare Annual Wellness Visit and 3 month follow up.   She was admitted for R thumb traumatic nail bed injury repair by Dr. Jeannie Fend on 12/14/2018 and patient reports has done well since without residual symptoms.   She has hx of recurrent major depression currently well controlled by zoloft 100 mg.   BMI is Body mass index is 24.64 kg/m., she has been working on diet and exercise. She successfully lost weight last year from peak weight 194lb in 01/2018, doing Optivia, at maintenance weight.  Wt Readings from Last 3  Encounters:  04/16/19 155 lb (70.3 kg)  12/27/18 162 lb 6.4 oz (73.7 kg)  12/12/18 162 lb (73.5 kg)   Her blood pressure has been controlled at home, today their BP is BP: 116/76 She does workout. She denies chest pain, shortness of breath, dizziness.   She is on cholesterol medication (pravastatin 40 mg daily) and denies myalgias. Her cholesterol is at goal of LDL <100. The cholesterol last visit was:   Lab Results  Component Value Date   CHOL 155 12/27/2018   HDL 47 (L) 12/27/2018    LDLCALC 80 12/27/2018   TRIG 188 (H) 12/27/2018   CHOLHDL 3.3 12/27/2018    She has been working on diet and exercise for glucose management, and denies paresthesia of the feet, polydipsia, polyuria, visual disturbances, vomiting and weight loss. Last A1C in the office was:  Lab Results  Component Value Date   HGBA1C 5.3 12/27/2018   Last GFR: Lab Results  Component Value Date   GFRNONAA 97 12/27/2018   Patient is on Vitamin D supplement, was taking 10000 IU daily for several years, has since reduced to 5000 IU daily.  Lab Results  Component Value Date   VD25OH >150 (H) 12/27/2018      Medication Review: Current Outpatient Medications on File Prior to Visit  Medication Sig Dispense Refill  . acetaminophen (TYLENOL) 500 MG tablet Take 1,000 mg by mouth every 6 (six) hours as needed for mild pain or moderate pain.    . Ascorbic Acid (VITAMIN C) 1000 MG tablet Take 1,000 mg by mouth daily.    Marland Kitchen aspirin 81 MG tablet Take 81 mg by mouth daily.    Marland Kitchen atenolol (TENORMIN) 25 MG tablet Take 1 tablet Daily for BP 90 tablet 3  . Cholecalciferol (VITAMIN D) 2000 units tablet Take 1 tablet 2 x (10,000 u)  /day (Patient taking differently: Takes 5000 units daily)    . hydrochlorothiazide (HYDRODIURIL) 25 MG tablet Take 1 tablet Daily for BP &Fluid 90 tablet 3  . Multiple Vitamin (MULTIVITAMIN WITH MINERALS) TABS tablet Take 1 tablet by mouth daily.    . pravastatin (PRAVACHOL) 40 MG tablet Take 1 tablet at Bedtime for Cholesterol 90 tablet 3  . sertraline (ZOLOFT) 100 MG tablet Take 1 tablet Daily for Mood 90 tablet 3  . Zinc 50 MG TABS Take by mouth daily.     No current facility-administered medications on file prior to visit.    Allergies  Allergen Reactions  . Atorvastatin Nausea Only    Current Problems (verified) Patient Active Problem List   Diagnosis Date Noted  . History of adenomatous polyp of colon 04/04/2019  . BMI 24.0-24.9, adult 01/16/2018  . Fatty liver 03/01/2017  .  Depression, major, recurrent, in complete remission ([place]) 05/02/2014  . Other abnormal glucose 10/09/2013  . Medication management 10/09/2013  . Hypertension   . Hyperlipidemia   . Vitamin D deficiency     Screening Tests Immunization History  Administered Date(s) Administered  . Influenza, High Dose Seasonal PF 12/27/2018  . Influenza-Unspecified 11/15/2012, 12/19/2017  . Moderna SARS-COVID-2 Vaccination 03/23/2019  . PPD Test 03/07/2013, 05/02/2014, 05/22/2015, 10/10/2017  . Pneumococcal-Unspecified 02/15/2009  . Td 02/15/2002  . Tdap 03/02/2012  . Zoster 12/02/2015   Preventative care: Last colonoscopy: 04/2017, adenomatous polyps, due 3 years Last mammogram: 02/2019 Last pap smear/pelvic exam: 11/2015 neg HPV, declines further  DEXA: Will order to schedule with next mammogram   Prior vaccinations: TD or Tdap: 2014  Influenza: 12/2018  Pneumococcal:  due 1 year after prevnar  Prevnar13: Defer due to covid 19 vaccine scheduled Shingles/Zostavax: 2017,  Covid 19: 1/2, next scheduled this week  Names of Other Physician/Practitioners you currently use: 1. [facility] here for primary care 2. Dr. Shanon Rosser, eye doctor, last visit 2019 3. Dr. Melina Copa, dentist, last visit 2021, goes q50m  Patient Care Team: Unk Pinto, MD as PCP - General (Internal Medicine) Irene Shipper, MD as Consulting Physician (Gastroenterology)  SURGICAL HISTORY She  has a past surgical history that includes Cesarean section (1975 and 1980); Tubal ligation; Abdominal surgery (09/2016); and I & D extremity (Right, 12/14/2018). FAMILY HISTORY Her family history includes Cancer in her father; Diabetes in her brother, brother, mother, and another family member; Heart disease in her father; Hypertension in her brother, father, and mother; Prostate cancer in her father. SOCIAL HISTORY She  reports that she quit smoking about 33 years ago. She has never used smokeless  tobacco. She reports that she does not drink alcohol or use drugs.   MEDICARE WELLNESS OBJECTIVES: Physical activity: Current Exercise Habits: Home exercise routine, Type of exercise: walking, Time (Minutes): 45, Frequency (Times/Week): 5, Weekly Exercise (Minutes/Week): 225, Intensity: Mild, Exercise limited by: None identified Cardiac risk factors: Cardiac Risk Factors include: advanced age (>52men, >56 women);dyslipidemia;hypertension Depression/mood screen:   Depression screen [facility] 2/9 04/16/2019  Decreased Interest 0  Down, Depressed, Hopeless 0  PHQ - 2 Score 0  Altered sleeping 0  Tired, decreased energy 0  Change in appetite 0  Feeling bad or failure about yourself  0  Trouble concentrating 0  Moving slowly or fidgety/restless 0  Suicidal thoughts 0  PHQ-9 Score 0  Difficult doing work/chores Not difficult at all    ADLs:  In your present state of health, do you have any difficulty performing the following activities: 04/16/2019 12/26/2018  Hearing? N N  Vision? N N  Difficulty concentrating or making decisions? N N  Walking or climbing stairs? N N  Dressing or bathing? N N  Doing errands, shopping? N -  Some recent data might be hidden     Cognitive Testing  Alert? Yes  Normal Appearance?Yes  Oriented to person? Yes  Place? Yes   Time? Yes  Recall of three objects?  Yes  Can perform simple calculations? Yes  Displays appropriate judgment?Yes  Can read the correct time from a watch face?Yes  EOL planning: Does Patient Have a Medical Advance Directive?: No Would patient like information on creating a medical advance directive?: No - Patient declined  Review of Systems  Constitutional: Negative for malaise/fatigue and weight loss.  HENT: Negative for hearing loss and tinnitus.   Eyes: Negative for blurred vision and double vision.  Respiratory: Negative for cough, sputum production, shortness of breath and wheezing.   Cardiovascular: Negative for chest pain,  palpitations, orthopnea, claudication, leg swelling and PND.  Gastrointestinal: Negative for abdominal pain, blood in stool, constipation, diarrhea, heartburn, melena, nausea and vomiting.  Genitourinary: Negative.   Musculoskeletal: Negative for falls, joint pain and myalgias.  Skin: Negative for rash.  Neurological: Negative for dizziness, tingling, sensory change, weakness and headaches.  Endo/Heme/Allergies: Positive for environmental allergies. Negative for polydipsia.  Psychiatric/Behavioral: Negative.  Negative for depression, memory loss, substance abuse and suicidal ideas. The patient is not nervous/anxious and does not have insomnia.   All other systems reviewed and are negative.    Objective:     Today's Vitals   04/16/19 1602  BP: 116/76  Pulse:  66  Temp: (!) 97.3 F (36.3 C)  SpO2: 96%  Weight: 155 lb (70.3 kg)  Height: 5' 6.5" (1.689 m)   Body mass index is 24.64 kg/m.  General appearance: alert, no distress, WD/WN, female HEENT: normocephalic, sclerae anicteric, TMs pearly, nares patent, no discharge or erythema,  Oral cavity: Mask in place; oral exam deferred Neck: supple, no lymphadenopathy, no thyromegaly, no masses Heart: RRR, normal S1, S2, no murmurs Lungs: CTA bilaterally, no wheezes, rhonchi, or rales Abdomen: +bs, soft, non tender, non distended, no masses, no hepatomegaly, no splenomegaly Musculoskeletal: nontender, no swelling, no obvious deformity Extremities: no edema, no cyanosis, no clubbing Pulses: 2+ symmetric, upper and lower extremities, normal cap refill Neurological: alert, oriented x 3, CN2-12 intact, strength normal upper extremities and lower extremities, sensation normal throughout, DTRs 2+ throughout, no cerebellar signs, gait normal Psychiatric: normal affect, behavior normal, pleasant   EKG: WNL, NSCPT AAA [place]: negative  Medicare Attestation I have personally reviewed: The patient's medical and social history Their use of  alcohol, tobacco or illicit drugs Their current medications and supplements The patient's functional ability including ADLs,fall risks, home safety risks, cognitive, and hearing and visual impairment Diet and physical activities Evidence for depression or mood disorders  The patient's weight, height, BMI, and visual acuity have been recorded in the chart.  I have made referrals, counseling, and provided education to the patient based on review of the above and I have provided the patient with a written personalized care plan for preventive services.     Izora Ribas, NP   04/16/2019

## 2019-04-16 ENCOUNTER — Other Ambulatory Visit: Payer: Self-pay

## 2019-04-16 ENCOUNTER — Encounter: Payer: Self-pay | Admitting: Adult Health

## 2019-04-16 ENCOUNTER — Ambulatory Visit (INDEPENDENT_AMBULATORY_CARE_PROVIDER_SITE_OTHER): Payer: Medicare Other | Admitting: Adult Health

## 2019-04-16 VITALS — BP 116/76 | HR 66 | Temp 97.3°F | Ht 66.5 in | Wt 155.0 lb

## 2019-04-16 DIAGNOSIS — R7309 Other abnormal glucose: Secondary | ICD-10-CM

## 2019-04-16 DIAGNOSIS — Z860101 Personal history of adenomatous and serrated colon polyps: Secondary | ICD-10-CM

## 2019-04-16 DIAGNOSIS — R6889 Other general symptoms and signs: Secondary | ICD-10-CM

## 2019-04-16 DIAGNOSIS — Z86010 Personal history of colonic polyps: Secondary | ICD-10-CM

## 2019-04-16 DIAGNOSIS — I10 Essential (primary) hypertension: Secondary | ICD-10-CM | POA: Diagnosis not present

## 2019-04-16 DIAGNOSIS — Z0001 Encounter for general adult medical examination with abnormal findings: Secondary | ICD-10-CM | POA: Diagnosis not present

## 2019-04-16 DIAGNOSIS — E559 Vitamin D deficiency, unspecified: Secondary | ICD-10-CM

## 2019-04-16 DIAGNOSIS — K760 Fatty (change of) liver, not elsewhere classified: Secondary | ICD-10-CM

## 2019-04-16 DIAGNOSIS — Z79899 Other long term (current) drug therapy: Secondary | ICD-10-CM | POA: Diagnosis not present

## 2019-04-16 DIAGNOSIS — Z6824 Body mass index (BMI) 24.0-24.9, adult: Secondary | ICD-10-CM

## 2019-04-16 DIAGNOSIS — E785 Hyperlipidemia, unspecified: Secondary | ICD-10-CM

## 2019-04-16 DIAGNOSIS — E2839 Other primary ovarian failure: Secondary | ICD-10-CM

## 2019-04-16 DIAGNOSIS — Z136 Encounter for screening for cardiovascular disorders: Secondary | ICD-10-CM

## 2019-04-16 DIAGNOSIS — F3342 Major depressive disorder, recurrent, in full remission: Secondary | ICD-10-CM

## 2019-04-16 DIAGNOSIS — Z0000 Encounter for general adult medical examination without abnormal findings: Secondary | ICD-10-CM

## 2019-04-16 NOTE — Patient Instructions (Addendum)
Ruth Ward , Thank you for taking time to come for your Medicare Wellness Visit. I appreciate your ongoing commitment to your health goals. Please review the following plan we discussed and let me know if I can assist you in the future.   These are the goals we discussed: Goals    . Exercise 150 min/wk Moderate Activity       This is a list of the screening recommended for you and due dates:  Health Maintenance  Topic Date Due  . DEXA scan (bone density measurement)  12/27/2018  . Pneumonia vaccines (1 of 2 - PCV13) 05/17/2019*  . Mammogram  03/01/2020  . Colon Cancer Screening  04/15/2020  . Tetanus Vaccine  03/02/2022  . Flu Shot  Completed  .  Hepatitis C: One time screening is recommended by Center for Disease Control  (CDC) for  adults born from [birth year] through 1967.   Completed  . HIV Screening  Completed  . Pap Smear  Discontinued  *Topic was postponed. The date shown is not the original due date.      Please call your insurance about whether they cover shingrix vaccine (new shingles vaccine)   If they cover can get at [facility]   Please schedule bone density exam with next mammogram - order is in the system      Zoster Vaccine, Recombinant injection What is this medicine? ZOSTER VACCINE (ZOS ter vak SEEN) is used to prevent shingles in adults 66 years old and over. This vaccine is not used to treat shingles or nerve pain from shingles. This medicine may be used for other purposes; ask your health care provider or pharmacist if you have questions. COMMON BRAND NAME(S): [facility] What should I tell my health care provider before I take this medicine? They need to know if you have any of these conditions:  blood disorders or disease  cancer like leukemia or lymphoma  immune system problems or therapy  an unusual or allergic reaction to vaccines, other medications, foods, dyes, or preservatives  pregnant or trying to get pregnant  breast-feeding How should I use  this medicine? This vaccine is for injection in a muscle. It is given by a health care professional. Talk to your pediatrician regarding the use of this medicine in children. This medicine is not approved for use in children. Overdosage: If you think you have taken too much of this medicine contact a poison control center or emergency room at once. NOTE: This medicine is only for you. Do not share this medicine with others. What if I miss a dose? Keep appointments for follow-up (booster) doses as directed. It is important not to miss your dose. Call your doctor or health care professional if you are unable to keep an appointment. What may interact with this medicine?  medicines that suppress your immune system  medicines to treat cancer  steroid medicines like prednisone or cortisone This list may not describe all possible interactions. Give your health care provider a list of all the medicines, herbs, non-prescription drugs, or dietary supplements you use. Also tell them if you smoke, drink alcohol, or use illegal drugs. Some items may interact with your medicine. What should I watch for while using this medicine? Visit your doctor for regular check ups. This vaccine, like all vaccines, may not fully protect everyone. What side effects may I notice from receiving this medicine? Side effects that you should report to your doctor or health care professional as soon as possible:  allergic  reactions like skin rash, itching or hives, swelling of the face, lips, or tongue  breathing problems Side effects that usually do not require medical attention (report these to your doctor or health care professional if they continue or are bothersome):  chills  headache  fever  nausea, vomiting  redness, warmth, pain, swelling or itching at site where injected  tiredness This list may not describe all possible side effects. Call your doctor for medical advice about side effects. You may report  side effects to FDA at 1-800-FDA-1088. Where should I keep my medicine? This vaccine is only given in a clinic, pharmacy, doctor's office, or other health care setting and will not be stored at home. NOTE: This sheet is a summary. It may not cover all possible information. If you have questions about this medicine, talk to your doctor, pharmacist, or health care provider.  2020 Elsevier/Gold Standard (2016-09-13 13:20:30)

## 2019-04-17 LAB — CBC WITH DIFFERENTIAL/PLATELET
Absolute Monocytes: 440 cells/uL (ref 200–950)
Basophils Absolute: 19 cells/uL (ref 0–200)
Basophils Relative: 0.3 %
Eosinophils Absolute: 37 cells/uL (ref 15–500)
Eosinophils Relative: 0.6 %
HCT: 41.1 % (ref 35.0–45.0)
Hemoglobin: 14.1 g/dL (ref 11.7–15.5)
Lymphs Abs: 2232 cells/uL (ref 850–3900)
MCH: 32.7 pg (ref 27.0–33.0)
MCHC: 34.3 g/dL (ref 32.0–36.0)
MCV: 95.4 fL (ref 80.0–100.0)
MPV: 10.3 fL (ref 7.5–12.5)
Monocytes Relative: 7.1 %
Neutro Abs: 3472 cells/uL (ref 1500–7800)
Neutrophils Relative %: 56 %
Platelets: 258 10*3/uL (ref 140–400)
RBC: 4.31 10*6/uL (ref 3.80–5.10)
RDW: 11.7 % (ref 11.0–15.0)
Total Lymphocyte: 36 %
WBC: 6.2 10*3/uL (ref 3.8–10.8)

## 2019-04-17 LAB — LIPID PANEL
Cholesterol: 180 mg/dL (ref <200)
HDL: 51 mg/dL (ref >=50)
LDL Cholesterol (Calc): 99 mg/dL (calc)
Non-HDL Cholesterol (Calc): 129 mg/dL (calc) (ref <130)
Total CHOL/HDL Ratio: 3.5 (calc) (ref <5.0)
Triglycerides: 199 mg/dL — ABNORMAL HIGH (ref <150)

## 2019-04-17 LAB — COMPLETE METABOLIC PANEL WITH GFR
AG Ratio: 2 (calc) (ref 1.0–2.5)
ALT: 43 U/L — ABNORMAL HIGH (ref 6–29)
AST: 26 U/L (ref 10–35)
Albumin: 4.5 g/dL (ref 3.6–5.1)
Alkaline phosphatase (APISO): 85 U/L (ref 37–153)
BUN: 17 mg/dL (ref 7–25)
CO2: 31 mmol/L (ref 20–32)
Calcium: 9.8 mg/dL (ref 8.6–10.4)
Chloride: 92 mmol/L — ABNORMAL LOW (ref 98–110)
Creat: 0.53 mg/dL (ref 0.50–0.99)
GFR, Est African American: 115 mL/min/{1.73_m2} (ref >=60)
GFR, Est Non African American: 100 mL/min/{1.73_m2} (ref >=60)
Globulin: 2.2 g/dL (calc) (ref 1.9–3.7)
Glucose, Bld: 87 mg/dL (ref 65–99)
Potassium: 4 mmol/L (ref 3.5–5.3)
Sodium: 131 mmol/L — ABNORMAL LOW (ref 135–146)
Total Bilirubin: 0.4 mg/dL (ref 0.2–1.2)
Total Protein: 6.7 g/dL (ref 6.1–8.1)

## 2019-04-17 LAB — VITAMIN D 25 HYDROXY (VIT D DEFICIENCY, FRACTURES): Vit D, 25-Hydroxy: 89 ng/mL (ref 30–100)

## 2019-04-17 LAB — TSH: TSH: 2.13 mIU/L (ref 0.40–4.50)

## 2019-04-17 LAB — MAGNESIUM: Magnesium: 2.2 mg/dL (ref 1.5–2.5)

## 2019-05-03 DIAGNOSIS — H25813 Combined forms of age-related cataract, bilateral: Secondary | ICD-10-CM | POA: Diagnosis not present

## 2019-05-21 DIAGNOSIS — H25813 Combined forms of age-related cataract, bilateral: Secondary | ICD-10-CM | POA: Diagnosis not present

## 2019-06-14 DIAGNOSIS — H2511 Age-related nuclear cataract, right eye: Secondary | ICD-10-CM | POA: Diagnosis not present

## 2019-06-28 DIAGNOSIS — H25812 Combined forms of age-related cataract, left eye: Secondary | ICD-10-CM | POA: Diagnosis not present

## 2019-07-12 ENCOUNTER — Other Ambulatory Visit: Payer: Self-pay

## 2019-07-12 ENCOUNTER — Other Ambulatory Visit: Payer: Medicare Other

## 2019-07-12 ENCOUNTER — Encounter: Payer: Self-pay | Admitting: Adult Health

## 2019-07-12 ENCOUNTER — Ambulatory Visit
Admission: RE | Admit: 2019-07-12 | Discharge: 2019-07-12 | Disposition: A | Discharge status: Home / Self Care | Payer: Medicare Other | Source: Ambulatory Visit | Attending: Adult Health | Admitting: Adult Health

## 2019-07-12 DIAGNOSIS — M8589 Other specified disorders of bone density and structure, multiple sites: Secondary | ICD-10-CM | POA: Diagnosis not present

## 2019-07-12 DIAGNOSIS — Z780 Asymptomatic menopausal state: Secondary | ICD-10-CM | POA: Diagnosis not present

## 2019-07-12 DIAGNOSIS — E2839 Other primary ovarian failure: Secondary | ICD-10-CM

## 2019-07-12 DIAGNOSIS — M8580 Other specified disorders of bone density and structure, unspecified site: Secondary | ICD-10-CM | POA: Insufficient documentation

## 2019-07-16 ENCOUNTER — Encounter: Payer: Self-pay | Admitting: Internal Medicine

## 2019-07-16 NOTE — Progress Notes (Signed)
History of Present Illness:       [name]  presents for 6 month follow up with HTN, HLD, Pre-Diabetes and Vitamin D Deficiency.       Patient is treated for HTN (2000) & BP has been controlled at home. Today's BP is at goal - 112/80. Patient has had no complaints of any cardiac type chest pain, palpitations, dyspnea / orthopnea / PND, dizziness, claudication, or dependent edema.      Hyperlipidemia is controlled with diet & Pravastatin. Patient denies myalgias or other med SE's. Last Lipids were at goal except elevated Trig's:   Lab Results  Component Value Date   CHOL 180 04/16/2019   HDL 51 04/16/2019   LDLCALC 99 04/16/2019   TRIG 199 (H) 04/16/2019   CHOLHDL 3.5 04/16/2019    Also, the patient has hx/o mild obesity (BMO 30+ and is monitored expectantly for glucose intolerance and has had no symptoms of reactive hypoglycemia, diabetic polys, paresthesias or visual blurring.  Last A1c was Normal & at goal:  Lab Results  Component Value Date   HGBA1C 5.3 12/27/2018       Further, the patient also has history of Vitamin D Deficiency("43" /on Tx /2008)  and supplements vitamin D without any suspected side-effects. Last vitamin D was at goal:  Lab Results  Component Value Date   VD25OH 89 04/16/2019    Current Outpatient Medications on File Prior to Visit  Medication Sig  . Ascorbic Acid (VITAMIN C) 1000 MG tablet Take 1,000 mg by mouth daily.  Marland Kitchen aspirin 81 MG tablet Take 81 mg by mouth daily.  Marland Kitchen atenolol (TENORMIN) 25 MG tablet Take 1 tablet Daily for BP  . Cholecalciferol (VITAMIN D) 2000 units tablet Take 1 tablet 2 x (10,000 u)  /day (Patient taking differently: Takes 5000 units daily)  . hydrochlorothiazide (HYDRODIURIL) 25 MG tablet Take 1 tablet Daily for BP &Fluid  . Multiple Vitamin (MULTIVITAMIN WITH MINERALS) TABS tablet Take 1 tablet by mouth daily.  . pravastatin (PRAVACHOL) 40 MG tablet Take 1 tablet at Bedtime for Cholesterol  .  sertraline (ZOLOFT) 100 MG tablet Take 1 tablet Daily for Mood  . Zinc 50 MG TABS Take by mouth daily.  Marland Kitchen acetaminophen (TYLENOL) 500 MG tablet Take 1,000 mg by mouth every 6 (six) hours as needed for mild pain or moderate pain.   No current facility-administered medications on file prior to visit.    Allergies  Allergen Reactions  . Atorvastatin Nausea Only   PMHx:   Past Medical History:  Diagnosis Date  . Allergy   . Anxiety   . Climacteric   . Depression   . Fibrocystic breast   . Hypercholesteremia   . Hyperlipidemia   . Hypertension   . Thumb laceration, right, initial encounter   . Vitamin D deficiency     Immunization History  Administered Date(s) Administered  . Influenza, High Dose Seasonal PF 12/27/2018  . Influenza-Unspecified 11/15/2012, 12/19/2017  . Moderna SARS-COVID-2 Vaccination 03/23/2019, 04/18/2019  . PPD Test 03/07/2013, 05/02/2014, 05/22/2015, 10/10/2017  . Pneumococcal-Unspecified 02/15/2009  . Td 02/15/2002  . Tdap 03/02/2012  . Zoster 12/02/2015    Past Surgical History:  Procedure Laterality Date  . ABDOMINAL SURGERY  09/2016   "tummy tuck"  . [place]  . I & D EXTREMITY Right 12/14/2018   Procedure: Right thumb nailbed repair, distal phalanx irrigation and debridement and closed reduction with percutaneous  pinning;  Surgeon: Verner Mould, MD;  Location: [place];  Service: Orthopedics;  Laterality: Right;  thumb   . TUBAL LIGATION      FHx:    Reviewed / unchanged  SHx:    Reviewed / unchanged   Systems Review:  Constitutional: Denies fever, chills, wt changes, headaches, insomnia, fatigue, night sweats, change in appetite. Eyes: Denies redness, blurred vision, diplopia, discharge, itchy, watery eyes.  ENT: Denies discharge, congestion, post nasal drip, epistaxis, sore throat, earache, hearing loss, dental pain, tinnitus, vertigo, sinus pain, snoring.  CV: Denies chest pain,  palpitations, irregular heartbeat, syncope, dyspnea, diaphoresis, orthopnea, PND, claudication or edema. Respiratory: denies cough, dyspnea, DOE, pleurisy, hoarseness, laryngitis, wheezing.  Gastrointestinal: Denies dysphagia, odynophagia, heartburn, reflux, water brash, abdominal pain or cramps, nausea, vomiting, bloating, diarrhea, constipation, hematemesis, melena, hematochezia  or hemorrhoids. Genitourinary: Denies dysuria, frequency, urgency, nocturia, hesitancy, discharge, hematuria or flank pain. Musculoskeletal: Denies arthralgias, myalgias, stiffness, jt. swelling, pain, limping or strain/sprain.  Skin: Denies pruritus, rash, hives, warts, acne, eczema or change in skin lesion(s). Neuro: No weakness, tremor, incoordination, spasms, paresthesia or pain. Psychiatric: Denies confusion, memory loss or sensory loss. Endo: Denies change in weight, skin or hair change.  Heme/Lymph: No excessive bleeding, bruising or enlarged lymph nodes.  Physical Exam  BP 112/80   Pulse 60   Temp (!) 97 F (36.1 C)   Resp 16   Ht 5' 6.5" (1.689 m)   Wt 150 lb 9.6 oz (68.3 kg)   BMI 23.94 kg/m   Appears  well nourished, well groomed  and in no distress.  Eyes: PERRLA, EOMs, conjunctiva no swelling or erythema. Sinuses: No frontal/maxillary tenderness ENT/Mouth: EAC's clear, TM's nl w/o erythema, bulging. Nares clear w/o erythema, swelling, exudates. Oropharynx clear without erythema or exudates. Oral hygiene is good. Tongue normal, non obstructing. Hearing intact.  Neck: Supple. Thyroid not palpable. Car 2+/2+ without bruits, nodes or JVD. Chest: Respirations nl with BS clear & equal w/o rales, rhonchi, wheezing or stridor.  Cor: Heart sounds normal w/ regular rate and rhythm without sig. murmurs, gallops, clicks or rubs. Peripheral pulses normal and equal  without edema.  Abdomen: Soft & bowel sounds normal. Non-tender w/o guarding, rebound, hernias, masses or organomegaly.  Lymphatics:  Unremarkable.  Musculoskeletal: Full ROM all peripheral extremities, joint stability, 5/5 strength and normal gait.  Skin: Warm, dry without exposed rashes, lesions or ecchymosis apparent.  Neuro: Cranial nerves intact, reflexes equal bilaterally. Sensory-motor testing grossly intact. Tendon reflexes grossly intact.  Pysch: Alert & oriented x 3.  Insight and judgement nl & appropriate. No ideations.  Assessment and Plan:  1. Essential hypertension  - Continue medication, monitor blood pressure at home.  - Continue DASH diet.  Reminder to go to the ER if any CP,  SOB, nausea, dizziness, severe HA, changes vision/speech.  - CBC with Differential/Platelet - COMPLETE METABOLIC PANEL WITH GFR - Magnesium - TSH  2. Hyperlipidemia, mixed  - Continue diet/meds, exercise,& lifestyle modifications.  - Continue monitor periodic cholesterol/liver & renal functions   - Lipid panel - TSH  3. Abnormal glucose  - Continue diet, exercise  - Lifestyle modifications.  - Monitor appropriate labs.  - Hemoglobin A1c - Insulin, random  4. Vitamin D deficiency  - Continue supplementation.  - VITAMIN D 25 Hydroxy  5. Medication management  - CBC with Differential/Platelet - COMPLETE METABOLIC PANEL WITH GFR - Magnesium - Lipid panel - TSH - Hemoglobin A1c - Insulin, random - VITAMIN D  25 Hydroxy        Discussed  regular exercise, BP monitoring, weight control to achieve/maintain BMI less than 25 and discussed med and SE's. Recommended labs to assess and monitor clinical status with further disposition pending results of labs.  I discussed the assessment and treatment plan with the patient. The patient was provided an opportunity to ask questions and all were answered. The patient agreed with the plan and demonstrated an understanding of the instructions.  I provided over 30 minutes of exam, counseling, chart review and  complex critical decision making.         The patient was advised  to call back or seek an in-person evaluation if the symptoms worsen or if the condition fails to improve as anticipated.   Kirtland Bouchard, MD

## 2019-07-16 NOTE — Patient Instructions (Signed)

## 2019-07-17 ENCOUNTER — Ambulatory Visit (INDEPENDENT_AMBULATORY_CARE_PROVIDER_SITE_OTHER): Payer: Medicare Other | Admitting: Internal Medicine

## 2019-07-17 ENCOUNTER — Other Ambulatory Visit: Payer: Self-pay

## 2019-07-17 VITALS — BP 112/80 | HR 60 | Temp 97.0°F | Resp 16 | Ht 66.5 in | Wt 150.6 lb

## 2019-07-17 DIAGNOSIS — E782 Mixed hyperlipidemia: Secondary | ICD-10-CM | POA: Diagnosis not present

## 2019-07-17 DIAGNOSIS — R7309 Other abnormal glucose: Secondary | ICD-10-CM

## 2019-07-17 DIAGNOSIS — Z79899 Other long term (current) drug therapy: Secondary | ICD-10-CM

## 2019-07-17 DIAGNOSIS — I10 Essential (primary) hypertension: Secondary | ICD-10-CM | POA: Diagnosis not present

## 2019-07-17 DIAGNOSIS — E559 Vitamin D deficiency, unspecified: Secondary | ICD-10-CM

## 2019-07-18 LAB — CBC WITH DIFFERENTIAL/PLATELET
Absolute Monocytes: 421 cells/uL (ref 200–950)
Basophils Absolute: 39 cells/uL (ref 0–200)
Basophils Relative: 0.8 %
Eosinophils Absolute: 39 cells/uL (ref 15–500)
Eosinophils Relative: 0.8 %
HCT: 42.2 % (ref 35.0–45.0)
Hemoglobin: 14.7 g/dL (ref 11.7–15.5)
Lymphs Abs: 1798 cells/uL (ref 850–3900)
MCH: 33.6 pg — ABNORMAL HIGH (ref 27.0–33.0)
MCHC: 34.8 g/dL (ref 32.0–36.0)
MCV: 96.3 fL (ref 80.0–100.0)
MPV: 10.1 fL (ref 7.5–12.5)
Monocytes Relative: 8.6 %
Neutro Abs: 2602 cells/uL (ref 1500–7800)
Neutrophils Relative %: 53.1 %
Platelets: 223 10*3/uL (ref 140–400)
RBC: 4.38 10*6/uL (ref 3.80–5.10)
RDW: 11.8 % (ref 11.0–15.0)
Total Lymphocyte: 36.7 %
WBC: 4.9 10*3/uL (ref 3.8–10.8)

## 2019-07-18 LAB — COMPLETE METABOLIC PANEL WITH GFR
AG Ratio: 2 (calc) (ref 1.0–2.5)
ALT: 28 U/L (ref 6–29)
AST: 19 U/L (ref 10–35)
Albumin: 4.4 g/dL (ref 3.6–5.1)
Alkaline phosphatase (APISO): 91 U/L (ref 37–153)
BUN: 15 mg/dL (ref 7–25)
CO2: 32 mmol/L (ref 20–32)
Calcium: 9.7 mg/dL (ref 8.6–10.4)
Chloride: 97 mmol/L — ABNORMAL LOW (ref 98–110)
Creat: 0.55 mg/dL (ref 0.50–0.99)
GFR, Est African American: 114 mL/min/{1.73_m2} (ref >=60)
GFR, Est Non African American: 98 mL/min/{1.73_m2} (ref >=60)
Globulin: 2.2 g/dL (calc) (ref 1.9–3.7)
Glucose, Bld: 95 mg/dL (ref 65–99)
Potassium: 4.6 mmol/L (ref 3.5–5.3)
Sodium: 136 mmol/L (ref 135–146)
Total Bilirubin: 0.4 mg/dL (ref 0.2–1.2)
Total Protein: 6.6 g/dL (ref 6.1–8.1)

## 2019-07-18 LAB — LIPID PANEL
Cholesterol: 179 mg/dL (ref <200)
HDL: 58 mg/dL (ref >=50)
LDL Cholesterol (Calc): 96 mg/dL (calc)
Non-HDL Cholesterol (Calc): 121 mg/dL (calc) (ref <130)
Total CHOL/HDL Ratio: 3.1 (calc) (ref <5.0)
Triglycerides: 151 mg/dL — ABNORMAL HIGH (ref <150)

## 2019-07-18 LAB — VITAMIN D 25 HYDROXY (VIT D DEFICIENCY, FRACTURES): Vit D, 25-Hydroxy: 102 ng/mL — ABNORMAL HIGH (ref 30–100)

## 2019-07-18 LAB — MAGNESIUM: Magnesium: 2.2 mg/dL (ref 1.5–2.5)

## 2019-07-18 LAB — HEMOGLOBIN A1C
Hgb A1c MFr Bld: 5.1 % of total Hgb (ref <5.7)
Mean Plasma Glucose: 100 (calc)
eAG (mmol/L): 5.5 (calc)

## 2019-07-18 LAB — TSH: TSH: 2.02 mIU/L (ref 0.40–4.50)

## 2019-07-18 LAB — INSULIN, RANDOM: Insulin: 8.9 u[IU]/mL

## 2019-07-19 ENCOUNTER — Other Ambulatory Visit: Payer: Self-pay | Admitting: Internal Medicine

## 2019-07-19 DIAGNOSIS — I10 Essential (primary) hypertension: Secondary | ICD-10-CM

## 2019-07-19 DIAGNOSIS — R001 Bradycardia, unspecified: Secondary | ICD-10-CM

## 2019-10-12 DIAGNOSIS — S61011D Laceration without foreign body of right thumb without damage to nail, subsequent encounter: Secondary | ICD-10-CM | POA: Diagnosis not present

## 2019-10-17 ENCOUNTER — Other Ambulatory Visit: Payer: Self-pay | Admitting: Internal Medicine

## 2019-10-17 DIAGNOSIS — R001 Bradycardia, unspecified: Secondary | ICD-10-CM

## 2019-10-17 DIAGNOSIS — I10 Essential (primary) hypertension: Secondary | ICD-10-CM

## 2019-12-06 DIAGNOSIS — H02885 Meibomian gland dysfunction left lower eyelid: Secondary | ICD-10-CM | POA: Diagnosis not present

## 2019-12-06 DIAGNOSIS — H00025 Hordeolum internum left lower eyelid: Secondary | ICD-10-CM | POA: Diagnosis not present

## 2020-01-08 ENCOUNTER — Other Ambulatory Visit: Payer: Self-pay | Admitting: Internal Medicine

## 2020-01-08 MED ORDER — NITROFURANTOIN MONOHYD MACRO 100 MG PO CAPS
ORAL_CAPSULE | ORAL | 0 refills | Status: DC
Start: 2020-01-08 — End: 2020-01-14

## 2020-01-14 ENCOUNTER — Encounter: Payer: Self-pay | Admitting: Internal Medicine

## 2020-01-14 MED ORDER — VITAMIN D3 125 MCG (5000 UT) PO TABS: ORAL_TABLET | ORAL | Status: AC

## 2020-01-14 NOTE — Patient Instructions (Signed)

## 2020-01-14 NOTE — Progress Notes (Signed)
Annual Screening/Preventative Visit & Comprehensive Evaluation &  Examination      [name]  presents for a Screening /Preventative Visit & comprehensive evaluation and management of multiple medical co-morbidities.  Patient has been followed for HTN, HLD, glucose intolerance  and Vitamin D Deficiency.      Patient also presents with a 1 week prodrome of head & chest congestion. She tested Negative for Covid at entry today.        HTN predates circa 2000. Patient's BP has been controlled at home and patient denies any cardiac symptoms as chest pain, palpitations, shortness of breath, dizziness or ankle swelling. Today's BP is at goal -  130/80.       Patient's hyperlipidemia is controlled with diet and Pravastatin. Patient denies myalgias or other medication SE's. Last lipids were at goal:  Lab Results  Component Value Date   CHOL 179 07/17/2019   HDL 58 07/17/2019   LDLCALC 96 07/17/2019   TRIG 151 (H) 07/17/2019   CHOLHDL 3.1 07/17/2019       Patient has mild obesity (BMI 28+) and is expectantly monitored for glucose intolerance  and patient denies reactive hypoglycemic symptoms, visual blurring, diabetic polys or paresthesias.  Note patient has gained 24# over the last 6 months. Last A1c was normal & at goal:  Lab Results  Component Value Date   HGBA1C 5.1 07/17/2019       Finally, patient has history of Vitamin D Deficiency ("43" /2008) and last Vitamin D was at goal:  Lab Results  Component Value Date   VD25OH 102 (H) 07/17/2019    Current Outpatient Medications on File Prior to Visit  Medication Sig  . VITAMIN C 1000 MG  Take  daily.  Marland Kitchen aspirin 81 MG  Take  daily.  Marland Kitchen atenolol  25 MG  Take 1 tablet Daily     . hydrochlorothiazide 25 MG  Take 1 tablet Daily   . Multiple Vitamin w/Min Take 1 tablet  daily.  . pravastatin  40 MG  Take 1 tablet at Bedtime  . sertraline  100 MG  Take 1 tablet Daily   . Zinc 50 MG  Take  daily.    Allergies    Allergen Reactions  . Atorvastatin Nausea Only    Past Medical History:  Diagnosis Date  . Allergy   . Anxiety   . Climacteric   . Depression   . Fibrocystic breast   . Hypercholesteremia   . Hyperlipidemia   . Hypertension   . Thumb laceration, right, initial encounter   . Vitamin D deficiency    Health Maintenance  Topic Date Due  . PNA vac Low Risk Adult (1 of 2 - PCV13) 12/27/2018  . INFLUENZA VACCINE  09/16/2019  . MAMMOGRAM  03/01/2020  . COLONOSCOPY  04/15/2020  . TETANUS/TDAP  03/02/2022  . DEXA SCAN  Completed  . COVID-19 Vaccine  Completed  . Hepatitis C Screening  Completed   Immunization History  Administered Date(s) Administered  . Influenza, High Dose Seasonal PF 12/27/2018  . Influenza-Unspecified 11/15/2012, 12/19/2017  . Moderna SARS-COVID-2 Vaccination 03/23/2019, 04/18/2019  . PPD Test 03/07/2013, 05/02/2014, 05/22/2015, 10/10/2017  . Pneumococcal-Unspecified 02/15/2009  . Td 02/15/2002  . Tdap 03/02/2012  . Zoster 12/02/2015    Last Colon -  04/15/2017 - Dr Fuller Plan recc 3 yr f/u due Mar 2022  Last MGM  - 01/18//2021  Last dexa BMD - 07/12/2019  Past Surgical History:  Procedure Laterality Date  .  ABDOMINAL SURGERY  09/2016   "tummy tuck"  . [place]  . I & D EXTREMITY Right 12/14/2018   Procedure: Right thumb nailbed repair, distal phalanx irrigation and debridement and closed reduction with percutaneous pinning;  Surgeon: Verner Mould, MD;  Location: [place];  Service: Orthopedics;  Laterality: Right;  thumb   . TUBAL LIGATION     Family History  Problem Relation Age of Onset  . Diabetes Other   . Hypertension Mother   . Diabetes Mother   . Hypertension Father   . Heart disease Father   . Cancer Father   . Prostate cancer Father   . Hypertension Brother   . Diabetes Brother   . Diabetes Brother   . Colon cancer Neg Hx    Social History   Tobacco Use  . Smoking status:  Former Smoker    Quit date: 02/15/1986    Years since quitting: 33.9  . Smokeless tobacco: Never Used  Vaping Use  . Vaping Use: Never used  Substance Use Topics  . Alcohol use: No  . Drug use: No    ROS Constitutional: Denies fever, chills, weight loss/gain, headaches, insomnia,  night sweats, and change in appetite. Does c/o fatigue. Eyes: Denies redness, blurred vision, diplopia, discharge, itchy, watery eyes.  ENT: Denies discharge, congestion, post nasal drip, epistaxis, sore throat, earache, hearing loss, dental pain, Tinnitus, Vertigo, Sinus pain, snoring.  Cardio: Denies chest pain, palpitations, irregular heartbeat, syncope, dyspnea, diaphoresis, orthopnea, PND, claudication, edema Respiratory: denies cough, dyspnea, DOE, pleurisy, hoarseness, laryngitis, wheezing.  Gastrointestinal: Denies dysphagia, heartburn, reflux, water brash, pain, cramps, nausea, vomiting, bloating, diarrhea, constipation, hematemesis, melena, hematochezia, jaundice, hemorrhoids Genitourinary: Denies dysuria, frequency, urgency, nocturia, hesitancy, discharge, hematuria, flank pain Breast: Breast lumps, nipple discharge, bleeding.  Musculoskeletal: Denies arthralgia, myalgia, stiffness, Jt. Swelling, pain, limp, and strain/sprain. Denies falls. Skin: Denies puritis, rash, hives, warts, acne, eczema, changing in skin lesion Neuro: No weakness, tremor, incoordination, spasms, paresthesia, pain Psychiatric: Denies confusion, memory loss, sensory loss. Denies Depression. Endocrine: Denies change in weight, skin, hair change, nocturia, and paresthesia, diabetic polys, visual blurring, hyper / hypo glycemic episodes.  Heme/Lymph: No excessive bleeding, bruising, enlarged lymph nodes.  Physical Exam  BP 130/80   Pulse 69   Temp (!) 97.5 F (36.4 C)   Ht 5\' 6"  (1.676 m)   Wt 174 lb (78.9 kg)   SpO2 97%   BMI 28.08 kg/m   General Appearance: Well nourished, well groomed and in no apparent distress. (+)  congested cough.  Eyes: PERRLA, EOMs, conjunctiva no swelling or erythema, normal fundi and vessels. Sinuses:  (+) frontal/maxillary tenderness ENT/Mouth: EACs patent / TMs  nl. Nares clear without erythema, swelling, mucoid exudates. Oral hygiene is good. No erythema, swelling, or exudate. Tongue normal, non-obstructing. Tonsils not swollen or erythematous. Hearing normal.  Neck: Supple, thyroid not palpable. No bruits, nodes or JVD. Respiratory: Respiratory effort normal.  BS equal and clear bilateral with few scattered rales &  Rhonchi clearing with cough and no wheezing or stridor. Cardio: Heart sounds are normal with regular rate and rhythm and no murmurs, rubs or gallops. Peripheral pulses are normal and equal bilaterally without edema. No aortic or femoral bruits. Chest: symmetric with normal excursions and percussion. Breasts: Symmetric, without lumps, nipple discharge, retractions, or fibrocystic changes.  Abdomen: Flat, soft with bowel sounds active. Nontender, no guarding, rebound, hernias, masses, or organomegaly.  Lymphatics: Non tender without lymphadenopathy.  Genitourinary:  Musculoskeletal:  Full ROM all peripheral extremities, joint stability, 5/5 strength, and normal gait. Skin: Warm and dry without rashes, lesions, cyanosis, clubbing or  ecchymosis.  Neuro: Cranial nerves intact, reflexes equal bilaterally. Normal muscle tone, no cerebellar symptoms. Sensation intact.  Pysch: Alert and oriented X 3, normal affect, Insight and Judgment appropriate.   Assessment and Plan  1. Annual Preventative Screening Examination   2. Essential hypertension  - EKG 12-Lead - Urinalysis, Routine w reflex microscopic - Microalbumin / creatinine urine ratio - CBC with Differential/Platelet - COMPLETE METABOLIC PANEL WITH GFR - Magnesium - TSH  3. Hyperlipidemia, mixed  - EKG 12-Lead - Lipid panel - TSH  4. Abnormal glucose  - EKG 12-Lead - Hemoglobin A1c - Insulin,  random  5. Vitamin D deficiency  - VITAMIN D 25 Hydroxy   6. Screening for colorectal cancer   7. Screening for ischemic heart disease  - EKG 12-Lead  8. FHx: heart disease  - EKG 12-Lead  9. Former smoker  - EKG 12-Lead  10. Medication management  - Urinalysis, Routine w reflex microscopic - Microalbumin / creatinine urine ratio - CBC with Differential/Platelet - COMPLETE METABOLIC PANEL WITH GFR - Magnesium - Lipid panel - TSH - Hemoglobin A1c - Insulin, random - VITAMIN D 25 Hydroxy         Patient was counseled in prudent diet to achieve/maintain BMI less than 25 for weight control, BP monitoring, regular exercise and medications. Discussed med's effects and SE's. Screening labs and tests as requested with regular follow-up as recommended. Over 40 minutes of exam, counseling, chart review and high complex critical decision making was performed.   Kirtland Bouchard, MD

## 2020-01-15 ENCOUNTER — Other Ambulatory Visit: Payer: Self-pay

## 2020-01-15 ENCOUNTER — Ambulatory Visit (INDEPENDENT_AMBULATORY_CARE_PROVIDER_SITE_OTHER): Payer: Medicare Other | Admitting: Internal Medicine

## 2020-01-15 ENCOUNTER — Encounter: Payer: Self-pay | Admitting: Internal Medicine

## 2020-01-15 VITALS — BP 130/80 | HR 69 | Temp 97.5°F | Ht 66.0 in | Wt 174.0 lb

## 2020-01-15 DIAGNOSIS — J40 Bronchitis, not specified as acute or chronic: Secondary | ICD-10-CM | POA: Diagnosis not present

## 2020-01-15 DIAGNOSIS — Z0001 Encounter for general adult medical examination with abnormal findings: Secondary | ICD-10-CM | POA: Diagnosis not present

## 2020-01-15 DIAGNOSIS — I10 Essential (primary) hypertension: Secondary | ICD-10-CM | POA: Diagnosis not present

## 2020-01-15 DIAGNOSIS — Z87891 Personal history of nicotine dependence: Secondary | ICD-10-CM | POA: Diagnosis not present

## 2020-01-15 DIAGNOSIS — E559 Vitamin D deficiency, unspecified: Secondary | ICD-10-CM | POA: Diagnosis not present

## 2020-01-15 DIAGNOSIS — Z79899 Other long term (current) drug therapy: Secondary | ICD-10-CM

## 2020-01-15 DIAGNOSIS — J0140 Acute pansinusitis, unspecified: Secondary | ICD-10-CM | POA: Diagnosis not present

## 2020-01-15 DIAGNOSIS — E782 Mixed hyperlipidemia: Secondary | ICD-10-CM | POA: Diagnosis not present

## 2020-01-15 DIAGNOSIS — R7309 Other abnormal glucose: Secondary | ICD-10-CM | POA: Diagnosis not present

## 2020-01-15 DIAGNOSIS — Z136 Encounter for screening for cardiovascular disorders: Secondary | ICD-10-CM | POA: Diagnosis not present

## 2020-01-15 DIAGNOSIS — Z8249 Family history of ischemic heart disease and other diseases of the circulatory system: Secondary | ICD-10-CM

## 2020-01-15 DIAGNOSIS — R6889 Other general symptoms and signs: Secondary | ICD-10-CM

## 2020-01-15 DIAGNOSIS — Z1211 Encounter for screening for malignant neoplasm of colon: Secondary | ICD-10-CM

## 2020-01-15 DIAGNOSIS — Z1152 Encounter for screening for COVID-19: Secondary | ICD-10-CM

## 2020-01-15 LAB — POC COVID19 BINAXNOW: SARS Coronavirus 2 Ag: NEGATIVE

## 2020-01-15 MED ORDER — AZITHROMYCIN 250 MG PO TABS: ORAL_TABLET | ORAL | 1 refills | Status: DC

## 2020-01-15 MED ORDER — DEXAMETHASONE 4 MG PO TABS: ORAL_TABLET | ORAL | 0 refills | Status: DC

## 2020-01-16 LAB — MAGNESIUM: Magnesium: 2.3 mg/dL (ref 1.5–2.5)

## 2020-01-16 LAB — COMPLETE METABOLIC PANEL WITH GFR
AG Ratio: 2 (calc) (ref 1.0–2.5)
ALT: 24 U/L (ref 6–29)
AST: 19 U/L (ref 10–35)
Albumin: 4.6 g/dL (ref 3.6–5.1)
Alkaline phosphatase (APISO): 94 U/L (ref 37–153)
BUN: 18 mg/dL (ref 7–25)
CO2: 30 mmol/L (ref 20–32)
Calcium: 9.8 mg/dL (ref 8.6–10.4)
Chloride: 97 mmol/L — ABNORMAL LOW (ref 98–110)
Creat: 0.55 mg/dL (ref 0.50–0.99)
GFR, Est African American: 113 mL/min/{1.73_m2} (ref >=60)
GFR, Est Non African American: 98 mL/min/{1.73_m2} (ref >=60)
Globulin: 2.3 g/dL (calc) (ref 1.9–3.7)
Glucose, Bld: 73 mg/dL (ref 65–99)
Potassium: 4.4 mmol/L (ref 3.5–5.3)
Sodium: 138 mmol/L (ref 135–146)
Total Bilirubin: 0.3 mg/dL (ref 0.2–1.2)
Total Protein: 6.9 g/dL (ref 6.1–8.1)

## 2020-01-16 LAB — CBC WITH DIFFERENTIAL/PLATELET
Absolute Monocytes: 633 cells/uL (ref 200–950)
Basophils Absolute: 62 cells/uL (ref 0–200)
Basophils Relative: 1.1 %
Eosinophils Absolute: 162 cells/uL (ref 15–500)
Eosinophils Relative: 2.9 %
HCT: 44.4 % (ref 35.0–45.0)
Hemoglobin: 15.2 g/dL (ref 11.7–15.5)
Lymphs Abs: 1753 cells/uL (ref 850–3900)
MCH: 32.3 pg (ref 27.0–33.0)
MCHC: 34.2 g/dL (ref 32.0–36.0)
MCV: 94.3 fL (ref 80.0–100.0)
MPV: 10.7 fL (ref 7.5–12.5)
Monocytes Relative: 11.3 %
Neutro Abs: 2990 cells/uL (ref 1500–7800)
Neutrophils Relative %: 53.4 %
Platelets: 234 10*3/uL (ref 140–400)
RBC: 4.71 10*6/uL (ref 3.80–5.10)
RDW: 11.8 % (ref 11.0–15.0)
Total Lymphocyte: 31.3 %
WBC: 5.6 10*3/uL (ref 3.8–10.8)

## 2020-01-16 LAB — URINALYSIS, ROUTINE W REFLEX MICROSCOPIC
Bilirubin Urine: NEGATIVE
Glucose, UA: NEGATIVE
Hgb urine dipstick: NEGATIVE
Ketones, ur: NEGATIVE
Leukocytes,Ua: NEGATIVE
Nitrite: NEGATIVE
Protein, ur: NEGATIVE
Specific Gravity, Urine: 1.007 (ref 1.001–1.03)
pH: 7.5 (ref 5.0–8.0)

## 2020-01-16 LAB — HEMOGLOBIN A1C
Hgb A1c MFr Bld: 5.3 % of total Hgb (ref <5.7)
Mean Plasma Glucose: 105 (calc)
eAG (mmol/L): 5.8 (calc)

## 2020-01-16 LAB — TSH: TSH: 2.5 mIU/L (ref 0.40–4.50)

## 2020-01-16 LAB — MICROALBUMIN / CREATININE URINE RATIO
Creatinine, Urine: 15 mg/dL — ABNORMAL LOW (ref 20–275)
Microalb Creat Ratio: 13 mcg/mg creat (ref <30)
Microalb, Ur: 0.2 mg/dL

## 2020-01-16 LAB — INSULIN, RANDOM: Insulin: 11.5 u[IU]/mL

## 2020-01-16 LAB — VITAMIN D 25 HYDROXY (VIT D DEFICIENCY, FRACTURES): Vit D, 25-Hydroxy: 92 ng/mL (ref 30–100)

## 2020-01-16 LAB — LIPID PANEL
Cholesterol: 188 mg/dL (ref <200)
HDL: 71 mg/dL (ref >=50)
LDL Cholesterol (Calc): 83 mg/dL (calc)
Non-HDL Cholesterol (Calc): 117 mg/dL (calc) (ref <130)
Total CHOL/HDL Ratio: 2.6 (calc) (ref <5.0)
Triglycerides: 246 mg/dL — ABNORMAL HIGH (ref <150)

## 2020-01-16 NOTE — Progress Notes (Signed)
========================================================== -   Test results slightly outside the reference range are not unusual. If there is anything important, I will review this with you,  otherwise it is considered normal test values.  If you have further questions,  please do not hesitate to contact me at the office or via My Chart.  ==========================================================  -  Total Chol = 188 and LDL Chol = 83 - Both  Excellent   - Very low risk for Heart Attack  / Stroke ========================================================  - But Triglycerides (   246   ) or fats in blood are too high  (goal is less than 150)    - Recommend avoid fried & greasy foods,  sweets / candy,   - Avoid white rice  (brown or wild rice or Quinoa is OK),   - Avoid white potatoes  (sweet potatoes are OK)   - Avoid anything made from white flour  - bagels, doughnuts, rolls, buns, biscuits, white and   wheat breads, pizza crust and traditional  pasta made of white flour & egg white  - (vegetarian pasta or spinach or wheat pasta is OK).    - Multi-grain bread is OK - like multi-grain flat bread or  sandwich thins.   - Avoid alcohol in excess.   - Exercise is also important. ==========================================================  - A1c - Normal - No Diabetes - Great  ==========================================================  - Vitamin D = 92 - Excellent  ==========================================================  - All Else - CBC - Kidneys - Electrolytes - Liver - Magnesium & Thyroid    - all  Normal / OK ==========================================================

## 2020-01-27 ENCOUNTER — Other Ambulatory Visit: Payer: Self-pay | Admitting: Internal Medicine

## 2020-01-27 DIAGNOSIS — F3342 Major depressive disorder, recurrent, in full remission: Secondary | ICD-10-CM

## 2020-01-27 DIAGNOSIS — I10 Essential (primary) hypertension: Secondary | ICD-10-CM

## 2020-01-27 DIAGNOSIS — R001 Bradycardia, unspecified: Secondary | ICD-10-CM

## 2020-03-19 ENCOUNTER — Other Ambulatory Visit: Payer: Self-pay | Admitting: Internal Medicine

## 2020-03-19 ENCOUNTER — Encounter: Payer: Self-pay | Admitting: *Deleted

## 2020-03-19 ENCOUNTER — Telehealth: Payer: Self-pay | Admitting: *Deleted

## 2020-03-19 DIAGNOSIS — U071 COVID-19: Secondary | ICD-10-CM | POA: Insufficient documentation

## 2020-03-19 MED ORDER — PROMETHAZINE-DM 6.25-15 MG/5ML PO SYRP: ORAL_SOLUTION | ORAL | 1 refills | Status: DC

## 2020-03-19 MED ORDER — DEXAMETHASONE 4 MG PO TABS: ORAL_TABLET | ORAL | 0 refills | Status: DC

## 2020-03-19 MED ORDER — AZITHROMYCIN 250 MG PO TABS: ORAL_TABLET | ORAL | 1 refills | Status: DC

## 2020-03-19 MED ORDER — BENZONATATE 200 MG PO CAPS: ORAL_CAPSULE | ORAL | 1 refills | Status: DC

## 2020-03-19 NOTE — Telephone Encounter (Signed)
Patient called and was concerned about her O2 level. After speaking with patient, she was reading her O2 meter wrong and her O2 was 95%. She complained of headache, fever and cough. She states she is having yellow mucus when she coughs and yellow nasal drainage. Dr Melford Aase advised and will send in medication for the patient.

## 2020-03-19 NOTE — Progress Notes (Signed)
   03/19/2020  7:45 pm  Patient tested (+) Covid today  Having 3-4 days sx's of sinus/chest congestion fever to 101 deg. Persistent hacking cough. Thick yellowish nasal secretions & Sputum. No dyspnea.  Suspect secondary bacterial sinobronchitis.   Rx Zpak, Decadron taper, Benzonatate & phenergan DM syrup.

## 2020-04-11 ENCOUNTER — Other Ambulatory Visit: Payer: Self-pay | Admitting: Internal Medicine

## 2020-04-11 DIAGNOSIS — F3342 Major depressive disorder, recurrent, in full remission: Secondary | ICD-10-CM

## 2020-04-11 DIAGNOSIS — R001 Bradycardia, unspecified: Secondary | ICD-10-CM

## 2020-04-11 DIAGNOSIS — I10 Essential (primary) hypertension: Secondary | ICD-10-CM

## 2020-04-23 ENCOUNTER — Ambulatory Visit: Payer: Medicare Other | Admitting: Adult Health

## 2020-05-01 ENCOUNTER — Other Ambulatory Visit: Payer: Self-pay

## 2020-05-01 ENCOUNTER — Ambulatory Visit (INDEPENDENT_AMBULATORY_CARE_PROVIDER_SITE_OTHER): Payer: Medicare Other | Admitting: Adult Health Nurse Practitioner

## 2020-05-01 ENCOUNTER — Encounter: Payer: Self-pay | Admitting: Adult Health Nurse Practitioner

## 2020-05-01 VITALS — BP 132/80 | HR 79 | Temp 97.9°F | Ht 66.0 in | Wt 180.0 lb

## 2020-05-01 DIAGNOSIS — K760 Fatty (change of) liver, not elsewhere classified: Secondary | ICD-10-CM | POA: Diagnosis not present

## 2020-05-01 DIAGNOSIS — E782 Mixed hyperlipidemia: Secondary | ICD-10-CM | POA: Diagnosis not present

## 2020-05-01 DIAGNOSIS — Z79899 Other long term (current) drug therapy: Secondary | ICD-10-CM | POA: Diagnosis not present

## 2020-05-01 DIAGNOSIS — E559 Vitamin D deficiency, unspecified: Secondary | ICD-10-CM | POA: Diagnosis not present

## 2020-05-01 DIAGNOSIS — R5381 Other malaise: Secondary | ICD-10-CM | POA: Diagnosis not present

## 2020-05-01 DIAGNOSIS — E2839 Other primary ovarian failure: Secondary | ICD-10-CM

## 2020-05-01 DIAGNOSIS — F3342 Major depressive disorder, recurrent, in full remission: Secondary | ICD-10-CM

## 2020-05-01 DIAGNOSIS — Z86010 Personal history of colonic polyps: Secondary | ICD-10-CM | POA: Diagnosis not present

## 2020-05-01 DIAGNOSIS — K921 Melena: Secondary | ICD-10-CM | POA: Diagnosis not present

## 2020-05-01 DIAGNOSIS — Z860101 Personal history of adenomatous and serrated colon polyps: Secondary | ICD-10-CM

## 2020-05-01 DIAGNOSIS — Z6824 Body mass index (BMI) 24.0-24.9, adult: Secondary | ICD-10-CM

## 2020-05-01 DIAGNOSIS — R6889 Other general symptoms and signs: Secondary | ICD-10-CM

## 2020-05-01 DIAGNOSIS — R7309 Other abnormal glucose: Secondary | ICD-10-CM

## 2020-05-01 DIAGNOSIS — R109 Unspecified abdominal pain: Secondary | ICD-10-CM | POA: Diagnosis not present

## 2020-05-01 DIAGNOSIS — R5383 Other fatigue: Secondary | ICD-10-CM

## 2020-05-01 DIAGNOSIS — Z0001 Encounter for general adult medical examination with abnormal findings: Secondary | ICD-10-CM | POA: Diagnosis not present

## 2020-05-01 DIAGNOSIS — I10 Essential (primary) hypertension: Secondary | ICD-10-CM | POA: Diagnosis not present

## 2020-05-01 DIAGNOSIS — E538 Deficiency of other specified B group vitamins: Secondary | ICD-10-CM

## 2020-05-01 DIAGNOSIS — Z0000 Encounter for general adult medical examination without abnormal findings: Secondary | ICD-10-CM

## 2020-05-01 MED ORDER — DICYCLOMINE HCL 10 MG PO CAPS: 10.0000 mg | ORAL_CAPSULE | Freq: Four times a day (QID) | ORAL | 0 refills | Status: DC

## 2020-05-01 NOTE — Progress Notes (Signed)
MEDICARE ANNUAL WELLNESS VISIT AND FOLLOW UP  Assessment:   Ruth Ward was seen today for medicare wellness and follow-up.  Diagnoses and all orders for this visit:  Encounter for Medicare annual wellness, second Yearly  Essential hypertension Continue medication Monitor blood pressure at home; call if consistently over 130/80 Continue DASH diet.   Reminder to go to the ER if any CP, SOB, nausea, dizziness, severe HA, changes vision/speech, left arm numbness and tingling and jaw pain. -     CBC with Differential/Platelet -     COMPLETE METABOLIC PANEL WITH GFR -     Magnesium -     TSH  Vitamin D deficiency Continue supplementation to maintain goal of 70-100 Taking Vitamin D 5,000 IU daily -     VITAMIN D 25 Hydroxy (Vit-D Deficiency, Fractures)  Other abnormal glucose Recent A1Cs at goal Discussed diet/exercise, weight management  Defer A1C; check CMP -     COMPLETE METABOLIC PANEL WITH GFR   Hyperlipidemia, unspecified hyperlipidemia type Continue medication: Pravastatin 40mg  Continue low cholesterol diet and exercise.  Check lipid panel.  -     Lipid panel   Depression, major, recurrent, in complete remission (HCC) In remission on zoloft; has failed attempts to taper; continue med and monitor Lifestyle discussed: diet/exerise, sleep hygiene, stress management, hydration  History of adenomatous polyp of colon UTD on colonoscopies; due 2024 Discussed with patient  BMI 24.0-24.9,adult Continue to recommend diet heavy in fruits and veggies and low in animal meats, cheeses, and dairy products, appropriate calorie intake Discuss exercise recommendations routinely Continue to monitor weight at each visit  Estrogen deficiency -     DG Bone Density; Future  Fatty liver Maintain normal weight, avoid alcohol/tylenol, will monitor LFTs -     COMPLETE METABOLIC PANEL WITH GFR  Medication management Continued  Over 40 minutes of face to face interview, exam,  counseling, chart review and critical decision making was performed.  Future Appointments  Date Time Provider [place]  08/19/2020 10:30 AM Unk Pinto, MD [facility] None  01/27/2021  3:00 PM Unk Pinto, MD [facility] None  05/04/2021 11:00 AM Liane Comber, NP [facility] None     Plan:   During the course of the visit the patient was educated and counseled about appropriate screening and preventive services including:    Pneumococcal vaccine   Prevnar 13  Influenza vaccine  Td vaccine  Screening electrocardiogram  Bone densitometry screening  Colorectal cancer screening  Diabetes screening  Glaucoma screening  Nutrition counseling   Advanced directives: requested   Subjective:  Ruth Ward is a 65 y.o. female who presents for Medicare Annual Wellness Visit and 3 month follow up.   Reports personal history of COVI19 03/17/2020.  She reports she is still not back to normal.   Reports today she is still fatigued and no energy since then.  She report that it is slowly improving and increasing activities around the house and yard work.  She is not back to walking just yet.  She reports she has upset stomach.  She will get some bloating and some cramping after she has a bowel movement.  She is 4-6 on the bristol scale.  She reports when she eats whole grains ro salads it tends to bother her the next morning.    She has hx of recurrent major depression currently well controlled by zoloft 100 mg.   BMI is Body mass index is 29.05 kg/m., she has been working on diet and exercise. She successfully lost  weight last year from peak weight 194lb in 01/2018, doing Optivia, at maintenance weight.  Wt Readings from Last 3 Encounters:  05/01/20 180 lb (81.6 kg)  01/15/20 174 lb (78.9 kg)  07/17/19 150 lb 9.6 oz (68.3 kg)   Her blood pressure has been controlled at home, today their BP is BP: 132/80 She does workout. She denies chest pain, shortness of  breath, dizziness.   She is on cholesterol medication (pravastatin 40 mg daily) and denies myalgias. Her cholesterol is at goal of LDL <100. The cholesterol last visit was:   Lab Results  Component Value Date   CHOL 241 (H) 05/01/2020   HDL 55 05/01/2020   LDLCALC 142 (H) 05/01/2020   TRIG 311 (H) 05/01/2020   CHOLHDL 4.4 05/01/2020    She has been working on diet and exercise for glucose management, and denies paresthesia of the feet, polydipsia, polyuria, visual disturbances, vomiting and weight loss. Last A1C in the office was:  Lab Results  Component Value Date   HGBA1C 5.3 01/15/2020   Last GFR: Lab Results  Component Value Date   GFRNONAA 101 05/01/2020   Patient is on Vitamin D supplement, was taking 10000 IU daily for several years, has since reduced to 5000 IU daily.  Lab Results  Component Value Date   VD25OH 92 01/15/2020      Medication Review: Current Outpatient Medications on File Prior to Visit  Medication Sig Dispense Refill  . Ascorbic Acid (VITAMIN C) 1000 MG tablet Take 1,000 mg by mouth daily.    Marland Kitchen aspirin 81 MG tablet Take 81 mg by mouth daily.    Marland Kitchen atenolol (TENORMIN) 25 MG tablet Take 1 tablet by mouth once daily for blood pressure 90 tablet 1  . Cholecalciferol (VITAMIN D3) 125 MCG (5000 UT) TABS Take      1 capsule       Daily    . hydrochlorothiazide (HYDRODIURIL) 25 MG tablet TAKE 1 TABLET BY MOUTH ONCE DAILY FOR BLOOD PRESSURE ,  FLUID  RETENTION  OR  ANKLE  SWELLING 90 tablet 1  . Multiple Vitamin (MULTIVITAMIN WITH MINERALS) TABS tablet Take 1 tablet by mouth daily.    . pravastatin (PRAVACHOL) 40 MG tablet TAKE 1 TABLET BY MOUTH AT BEDTIME FOR CHOLESTEROL 90 tablet 1  . sertraline (ZOLOFT) 100 MG tablet TAKE 1 TABLET BY MOUTH ONCE DAILY FOR  MOOD 90 tablet 1  . Zinc 50 MG TABS Take by mouth daily.     No current facility-administered medications on file prior to visit.    Allergies  Allergen Reactions  . Atorvastatin Nausea Only     Current Problems (verified) Patient Active Problem List   Diagnosis Date Noted  . COVID 03/19/2020  . Osteopenia 07/12/2019  . History of adenomatous polyp of colon 04/04/2019  . BMI 24.0-24.9, adult 01/16/2018  . Fatty liver 03/01/2017  . Depression, major, recurrent, in complete remission ([place]) 05/02/2014  . Other abnormal glucose 10/09/2013  . Medication management 10/09/2013  . Hypertension   . Hyperlipidemia   . Vitamin D deficiency     Screening Tests Immunization History  Administered Date(s) Administered  . Influenza, High Dose Seasonal PF 12/27/2018, 11/23/2019  . Influenza-Unspecified 11/15/2012, 12/19/2017  . Moderna Sars-Covid-2 Vaccination 03/23/2019, 04/18/2019, 11/23/2019  . PPD Test 03/07/2013, 05/02/2014, 05/22/2015, 10/10/2017  . Pneumococcal-Unspecified 02/15/2009  . Td 02/15/2002  . Tdap 03/02/2012  . Zoster 12/02/2015   Preventative care: Last colonoscopy: 04/2017, adenomatous polyps, due 2024 Last mammogram: 02/2019, DUE Last  pap smear/pelvic exam: 11/2015 neg HPV, declines further  DEXA: 2021   Prior vaccinations: TD or Tdap: 2014  Influenza: 11/2019  Pneumococcal: due 1 year after prevnar  Prevnar13: Defer due to covid 19 vaccine scheduled Shingles/Zostavax: 2017,  Covid 19: 1/2, next scheduled this week  Names of Other Physician/Practitioners you currently use: 1. [facility] here for primary care 2. Dr. Shanon Rosser, eye doctor, last visit 2021 3. Dr. Melina Copa, dentist, last visit 2022, goes q26m  Patient Care Team: Unk Pinto, MD as PCP - General (Internal Medicine) Irene Shipper, MD as Consulting Physician (Gastroenterology)  SURGICAL HISTORY She  has a past surgical history that includes Cesarean section (1975 and 1980); Tubal ligation; Abdominal surgery (09/2016); and I & D extremity (Right, 12/14/2018). FAMILY HISTORY Her family history includes Cancer in her father; Diabetes in her brother,  brother, mother, and another family member; Heart disease in her father; Hypertension in her brother, father, and mother; Prostate cancer in her father. SOCIAL HISTORY She  reports that she quit smoking about 34 years ago. She has never used smokeless tobacco. She reports that she does not drink alcohol and does not use drugs.   MEDICARE WELLNESS OBJECTIVES: Physical activity: Current Exercise Habits: Home exercise routine, Type of exercise: walking (Does yard work), Time (Minutes): 30, Frequency (Times/Week): 5, Weekly Exercise (Minutes/Week): 150, Intensity: Mild, Exercise limited by: None identified Cardiac risk factors: Cardiac Risk Factors include: advanced age (>56men, >52 women);dyslipidemia;hypertension Depression/mood screen:   Depression screen [facility] 2/9 05/01/2020  Decreased Interest 0  Down, Depressed, Hopeless 0  PHQ - 2 Score 0  Altered sleeping -  Tired, decreased energy -  Change in appetite -  Feeling bad or failure about yourself  -  Trouble concentrating -  Moving slowly or fidgety/restless -  Suicidal thoughts -  PHQ-9 Score -  Difficult doing work/chores -    ADLs:  In your present state of health, do you have any difficulty performing the following activities: 05/01/2020 01/14/2020  Hearing? N N  Vision? N N  Difficulty concentrating or making decisions? N N  Walking or climbing stairs? N N  Dressing or bathing? N N  Doing errands, shopping? N N  Preparing Food and eating ? N -  Using the Toilet? N -  In the past six months, have you accidently leaked urine? N -  Do you have problems with loss of bowel control? N -  Managing your Medications? N -  Managing your Finances? N -  Housekeeping or managing your Housekeeping? N -  Some recent data might be hidden     Cognitive Testing  Alert? Yes  Normal Appearance?Yes  Oriented to person? Yes  Place? Yes   Time? Yes  Recall of three objects?  Yes  Can perform simple calculations? Yes  Displays appropriate  judgment?Yes  Can read the correct time from a watch face?Yes  EOL planning: Does Patient Have a Medical Advance Directive?: No Would patient like information on creating a medical advance directive?: No - Patient declined  Review of Systems  Constitutional: Negative for malaise/fatigue and weight loss.  HENT: Negative for hearing loss and tinnitus.   Eyes: Negative for blurred vision and double vision.  Respiratory: Negative for cough, sputum production, shortness of breath and wheezing.   Cardiovascular: Negative for chest pain, palpitations, orthopnea, claudication, leg swelling and PND.  Gastrointestinal: Negative for abdominal pain, blood in stool, constipation, diarrhea, heartburn, melena, nausea and vomiting.  Genitourinary: Negative.  Musculoskeletal: Negative for falls, joint pain and myalgias.  Skin: Negative for rash.  Neurological: Negative for dizziness, tingling, sensory change, weakness and headaches.  Endo/Heme/Allergies: Positive for environmental allergies. Negative for polydipsia.  Psychiatric/Behavioral: Negative.  Negative for depression, memory loss, substance abuse and suicidal ideas. The patient is not nervous/anxious and does not have insomnia.   All other systems reviewed and are negative.    Objective:     Today's Vitals   05/01/20 1042  BP: 132/80  Pulse: 79  Temp: 97.9 F (36.6 C)  SpO2: 97%  Weight: 180 lb (81.6 kg)  Height: 5\' 6"  (1.676 m)   Body mass index is 29.05 kg/m.  General appearance: alert, no distress, WD/WN, female HEENT: normocephalic, sclerae anicteric, TMs pearly, nares patent, no discharge or erythema,  Oral cavity: Mask in place; oral exam deferred Neck: supple, no lymphadenopathy, no thyromegaly, no masses Heart: RRR, normal S1, S2, no murmurs Lungs: CTA bilaterally, no wheezes, rhonchi, or rales Abdomen: +bs, soft, non tender, non distended, no masses, no hepatomegaly, no splenomegaly Musculoskeletal: nontender, no  swelling, no obvious deformity Extremities: no edema, no cyanosis, no clubbing Pulses: 2+ symmetric, upper and lower extremities, normal cap refill Neurological: alert, oriented x 3, CN2-12 intact, strength normal upper extremities and lower extremities, sensation normal throughout, DTRs 2+ throughout, no cerebellar signs, gait normal Psychiatric: normal affect, behavior normal, pleasant   EKG: WNL, NSCPT AAA [place]: negative  Medicare Attestation I have personally reviewed: The patient's medical and social history Their use of alcohol, tobacco or illicit drugs Their current medications and supplements The patient's functional ability including ADLs,fall risks, home safety risks, cognitive, and hearing and visual impairment Diet and physical activities Evidence for depression or mood disorders  The patient's weight, height, BMI, and visual acuity have been recorded in the chart.  I have made referrals, counseling, and provided education to the patient based on review of the above and I have provided the patient with a written personalized care plan for preventive services.     Garnet Sierras, NP   05/01/2020

## 2020-05-01 NOTE — Patient Instructions (Addendum)
   We are going to send in Dicyclomine (Bentyl).  Take one tablet by mouth before every meal, three times a day.  This will help with cramping and bloating.  Do this for about 14 days.  The scription is written for four times a day.  You can also take a dose before bedtime as well.  After one week if you are still have excessive burping or flatulence start taking famotidine (Pepsid) 20mg  every day.  This will help to reduce acid.   GENERAL HEALTH GOALS  Know what a healthy weight is for you (roughly BMI <25) and aim to maintain this  Aim for 7+ servings of fruits and vegetables daily  70-80+ fluid ounces of water or unsweet tea for healthy kidneys  Limit to max 1 drink of alcohol per day; avoid smoking/tobacco  Limit animal fats in diet for cholesterol and heart health - choose grass fed whenever available  Avoid highly processed foods, and foods high in saturated/trans fats  Aim for low stress - take time to unwind and care for your mental health  Aim for 150 min of moderate intensity exercise weekly for heart health, and weights twice weekly for bone health  Aim for 7-9 hours of sleep daily

## 2020-05-02 LAB — CBC WITH DIFFERENTIAL/PLATELET
Absolute Monocytes: 472 cells/uL (ref 200–950)
Basophils Absolute: 58 cells/uL (ref 0–200)
Basophils Relative: 1.1 %
Eosinophils Absolute: 69 cells/uL (ref 15–500)
Eosinophils Relative: 1.3 %
HCT: 42.5 % (ref 35.0–45.0)
Hemoglobin: 14.5 g/dL (ref 11.7–15.5)
Lymphs Abs: 1876 cells/uL (ref 850–3900)
MCH: 32.2 pg (ref 27.0–33.0)
MCHC: 34.1 g/dL (ref 32.0–36.0)
MCV: 94.2 fL (ref 80.0–100.0)
MPV: 10.3 fL (ref 7.5–12.5)
Monocytes Relative: 8.9 %
Neutro Abs: 2825 cells/uL (ref 1500–7800)
Neutrophils Relative %: 53.3 %
Platelets: 268 10*3/uL (ref 140–400)
RBC: 4.51 10*6/uL (ref 3.80–5.10)
RDW: 12.7 % (ref 11.0–15.0)
Total Lymphocyte: 35.4 %
WBC: 5.3 10*3/uL (ref 3.8–10.8)

## 2020-05-02 LAB — COMPLETE METABOLIC PANEL WITH GFR
AG Ratio: 1.9 (calc) (ref 1.0–2.5)
ALT: 25 U/L (ref 6–29)
AST: 19 U/L (ref 10–35)
Albumin: 4.4 g/dL (ref 3.6–5.1)
Alkaline phosphatase (APISO): 91 U/L (ref 37–153)
BUN: 11 mg/dL (ref 7–25)
CO2: 32 mmol/L (ref 20–32)
Calcium: 9.7 mg/dL (ref 8.6–10.4)
Chloride: 101 mmol/L (ref 98–110)
Creat: 0.5 mg/dL (ref 0.50–0.99)
GFR, Est African American: 117 mL/min/{1.73_m2} (ref >=60)
GFR, Est Non African American: 101 mL/min/{1.73_m2} (ref >=60)
Globulin: 2.3 g/dL (calc) (ref 1.9–3.7)
Glucose, Bld: 95 mg/dL (ref 65–99)
Potassium: 4.3 mmol/L (ref 3.5–5.3)
Sodium: 140 mmol/L (ref 135–146)
Total Bilirubin: 0.4 mg/dL (ref 0.2–1.2)
Total Protein: 6.7 g/dL (ref 6.1–8.1)

## 2020-05-02 LAB — LIPID PANEL
Cholesterol: 241 mg/dL — ABNORMAL HIGH (ref <200)
HDL: 55 mg/dL (ref >=50)
LDL Cholesterol (Calc): 142 mg/dL (calc) — ABNORMAL HIGH
Non-HDL Cholesterol (Calc): 186 mg/dL (calc) — ABNORMAL HIGH (ref <130)
Total CHOL/HDL Ratio: 4.4 (calc) (ref <5.0)
Triglycerides: 311 mg/dL — ABNORMAL HIGH (ref <150)

## 2020-05-02 LAB — TSH: TSH: 1.92 mIU/L (ref 0.40–4.50)

## 2020-05-02 LAB — VITAMIN B12: Vitamin B-12: 598 pg/mL (ref 200–1100)

## 2020-05-13 ENCOUNTER — Other Ambulatory Visit: Payer: Self-pay

## 2020-05-13 DIAGNOSIS — K921 Melena: Secondary | ICD-10-CM

## 2020-05-13 LAB — POC HEMOCCULT BLD/STL (HOME/3-CARD/SCREEN)
Card #2 Fecal Occult Blod, POC: NEGATIVE
Card #3 Fecal Occult Blood, POC: NEGATIVE
Fecal Occult Blood, POC: NEGATIVE

## 2020-05-14 DIAGNOSIS — Z1211 Encounter for screening for malignant neoplasm of colon: Secondary | ICD-10-CM | POA: Diagnosis not present

## 2020-05-14 DIAGNOSIS — Z1212 Encounter for screening for malignant neoplasm of rectum: Secondary | ICD-10-CM | POA: Diagnosis not present

## 2020-05-22 ENCOUNTER — Other Ambulatory Visit: Payer: Self-pay | Admitting: Adult Health Nurse Practitioner

## 2020-05-22 DIAGNOSIS — R109 Unspecified abdominal pain: Secondary | ICD-10-CM

## 2020-05-24 ENCOUNTER — Other Ambulatory Visit: Payer: Self-pay | Admitting: Adult Health Nurse Practitioner

## 2020-05-24 DIAGNOSIS — R109 Unspecified abdominal pain: Secondary | ICD-10-CM

## 2020-07-01 ENCOUNTER — Encounter: Payer: Self-pay | Admitting: Gastroenterology

## 2020-08-19 ENCOUNTER — Ambulatory Visit: Payer: Medicare Other | Admitting: Internal Medicine

## 2020-09-10 NOTE — Progress Notes (Signed)
Future Appointments  Date Time Provider [place]  09/11/2020 11:30 AM Unk Pinto, MD [facility] None  01/27/2021  - CPE  3:00 PM Unk Pinto, MD [facility] None  05/04/2021  - Wellness 11:00 AM Liane Comber, NP [facility] None    History of Present Illness:       This very nice 65 y.o. MWF presents for 6  month follow up with HTN, HLD, Pre-Diabetes and Vitamin D Deficiency.        Patient is treated for HTN (2000) & BP has been controlled at home. Today's BP is at goal - 118/76. Patient has had no complaints of any cardiac type chest pain, palpitations, dyspnea / orthopnea / PND, dizziness, claudication, or dependent edema.       Hyperlipidemia is  not controlled with diet. Patient denies myalgias or other med SE's. Last Lipids off meds were not at goal:  Lab Results  Component Value Date   CHOL 241 (H) 05/01/2020   HDL 55 05/01/2020   LDLCALC 142 (H) 05/01/2020   TRIG 311 (H) 05/01/2020   CHOLHDL 4.4 05/01/2020     Also, the patient has been monitored expectantly for glucose intolerance and has had no symptoms of reactive hypoglycemia, diabetic polys, paresthesias or visual blurring.  Last A1c was normal & at goal:  Lab Results  Component Value Date   HGBA1C 5.3 01/15/2020                                                       Further, the patient also has history of Vitamin D Deficiency  ("43" /2008) and supplements vitamin D without any suspected side-effects. Last vitamin D was at goal:   Lab Results  Component Value Date   VD25OH 92 01/15/2020   Current Outpatient Medications on File Prior to Visit  Medication Sig   VITAMIN C 1000 MG  Take  daily.   aspirin 81 MG tablet Take  daily.   atenolol  25 MG tablet Take 1 tablet once daily    VITAMIN D  5000 u Take 1 capsule Daily   dicyclomine 10 MG capsule TAKE 1 CAPSULE 4 TIMES DAILY    hydrochlorothiazide  25 MG  TAKE 1 TABLET DAILY    Multiple Vitamin  Take 1 tablet daily.   pravastatin  40 MG tablet TAKE 1 TABLET AT BEDTIME    sertraline 100 MG tablet TAKE 1 TABLET DAILY    Zinc 50 MG TABS Take  daily.    Allergies  Allergen Reactions   Atorvastatin Nausea Only   PMHx:   Past Medical History:  Diagnosis Date   Allergy    Anxiety    Climacteric    Depression    Fibrocystic breast    Hypercholesteremia    Hyperlipidemia    Hypertension    Thumb laceration, right, initial encounter    Vitamin D deficiency      Immunization History  Administered Date(s) Administered   Influenza, High Dose  12/27/2018, 11/23/2019   Influenza 11/15/2012, 12/19/2017   Moderna Sars-Covid-2 Vacc 03/23/2019, 04/18/2019, 11/23/2019   PPD Test 004/07/2015, 10/10/2017   Pneumococcal-23 02/15/2009   Td 02/15/2002   Tdap 03/02/2012   Zoster, Live 12/02/2015     Past Surgical History:  Procedure Laterality Date   ABDOMINAL SURGERY  09/2016   "tummy tuck"  [place] Right 12/14/2018   Procedure: Right thumb nailbed repair, distal phalanx irrigation and debridement and closed reduction with percutaneous pinning;  Surgeon: Verner Mould, MD;  Location: [facility];  Service: Orthopedics;  Laterality: Right;  thumb    TUBAL LIGATION      FHx:    Reviewed / unchanged  SHx:    Reviewed / unchanged   Systems Review:  Constitutional: Denies fever, chills, wt changes, headaches, insomnia, fatigue, night sweats, change in appetite. Eyes: Denies redness, blurred vision, diplopia, discharge, itchy, watery eyes.  ENT: Denies discharge, congestion, post nasal drip, epistaxis, sore throat, earache, hearing loss, dental pain, tinnitus, vertigo, sinus pain, snoring.  CV: Denies chest pain, palpitations, irregular heartbeat, syncope, dyspnea, diaphoresis, orthopnea, PND, claudication or edema. Respiratory: denies cough, dyspnea, DOE, pleurisy, hoarseness, laryngitis, wheezing.  Gastrointestinal: Denies dysphagia, odynophagia,  heartburn, reflux, water brash, abdominal pain or cramps, nausea, vomiting, bloating, diarrhea, constipation, hematemesis, melena, hematochezia  or hemorrhoids. Genitourinary: Denies dysuria, frequency, urgency, nocturia, hesitancy, discharge, hematuria or flank pain. Musculoskeletal: Denies arthralgias, myalgias, stiffness, jt. swelling, pain, limping or strain/sprain.  Skin: Denies pruritus, rash, hives, warts, acne, eczema or change in skin lesion(s). Neuro: No weakness, tremor, incoordination, spasms, paresthesia or pain. Psychiatric: Denies confusion, memory loss or sensory loss. Endo: Denies change in weight, skin or hair change.  Heme/Lymph: No excessive bleeding, bruising or enlarged lymph nodes.  Physical Exam  BP 118/76   Pulse 71   Temp 97.7 F (36.5 C)   Resp 16   Ht '5\' 6"'$  (1.676 m)   Wt 184 lb 9.6 oz (83.7 kg)   SpO2 96%   BMI 29.80 kg/m   Appears  well nourished, well groomed  and in no distress.  Eyes: PERRLA, EOMs, conjunctiva no swelling or erythema. Sinuses: No frontal/maxillary tenderness ENT/Mouth: EAC's clear, TM's nl w/o erythema, bulging. Nares clear w/o erythema, swelling, exudates. Oropharynx clear without erythema or exudates. Oral hygiene is good. Tongue normal, non obstructing. Hearing intact.  Neck: Supple. Thyroid not palpable. Car 2+/2+ without bruits, nodes or JVD. Chest: Respirations nl with BS clear & equal w/o rales, rhonchi, wheezing or stridor.  Cor: Heart sounds normal w/ regular rate and rhythm without sig. murmurs, gallops, clicks or rubs. Peripheral pulses normal and equal  without edema.  Abdomen: Soft & bowel sounds normal. Non-tender w/o guarding, rebound, hernias, masses or organomegaly.  Lymphatics: Unremarkable.  Musculoskeletal: Full ROM all peripheral extremities, joint stability, 5/5 strength and normal gait.  Skin: Warm, dry without exposed rashes, lesions or ecchymosis apparent.  Neuro: Cranial nerves intact, reflexes equal  bilaterally. Sensory-motor testing grossly intact. Tendon reflexes grossly intact.  Pysch: Alert & oriented x 3.  Insight and judgement nl & appropriate. No ideations.  Assessment and Plan:  1. Essential hypertension  - Continue medication, monitor blood pressure at home.  - Continue DASH diet.  Reminder to go to the ER if any CP,  SOB, nausea, dizziness, severe HA, changes vision/speech.   - CBC with Differential/Platelet - COMPLETE METABOLIC PANEL WITH GFR - Magnesium - TSH  2. Hyperlipidemia, mixed  - Continue diet/meds, exercise,& lifestyle modifications.  - Continue monitor periodic cholesterol/liver & renal functions    - Lipid panel - TSH  - Continue diet, exercise  - Lifestyle modifications.  - Monitor appropriate labs   3. Abnormal glucose  - Hemoglobin A1c - Insulin, random  4. Vitamin D deficiency  - Continue supplementation   -  VITAMIN D 25 Hydroxy   5. Medication management  - CBC with Differential/Platelet - COMPLETE METABOLIC PANEL WITH GFR - Magnesium - Lipid panel - TSH - Hemoglobin A1c - Insulin, random - VITAMIN D 25 Hydroxy        Discussed  regular exercise, BP monitoring, weight control to achieve/maintain BMI less than 25 and discussed med and SE's. Recommended labs to assess and monitor clinical status with further disposition pending results of labs.  I discussed the assessment and treatment plan with the patient. The patient was provided an opportunity to ask questions and all were answered. The patient agreed with the plan and demonstrated an understanding of the instructions.  I provided over 30 minutes of exam, counseling, chart review and  complex critical decision making.        The patient was advised to call back or seek an in-person evaluation if the symptoms worsen or if the condition fails to improve as anticipated.   Kirtland Bouchard, MD

## 2020-09-11 ENCOUNTER — Ambulatory Visit (INDEPENDENT_AMBULATORY_CARE_PROVIDER_SITE_OTHER): Payer: Medicare Other | Admitting: Internal Medicine

## 2020-09-11 ENCOUNTER — Encounter: Payer: Self-pay | Admitting: Internal Medicine

## 2020-09-11 ENCOUNTER — Other Ambulatory Visit: Payer: Self-pay

## 2020-09-11 VITALS — BP 118/76 | HR 71 | Temp 97.7°F | Resp 16 | Ht 66.0 in | Wt 184.6 lb

## 2020-09-11 DIAGNOSIS — R7309 Other abnormal glucose: Secondary | ICD-10-CM

## 2020-09-11 DIAGNOSIS — Z79899 Other long term (current) drug therapy: Secondary | ICD-10-CM | POA: Diagnosis not present

## 2020-09-11 DIAGNOSIS — E559 Vitamin D deficiency, unspecified: Secondary | ICD-10-CM

## 2020-09-11 DIAGNOSIS — I10 Essential (primary) hypertension: Secondary | ICD-10-CM | POA: Diagnosis not present

## 2020-09-11 DIAGNOSIS — E782 Mixed hyperlipidemia: Secondary | ICD-10-CM

## 2020-09-11 NOTE — Patient Instructions (Signed)

## 2020-09-12 ENCOUNTER — Other Ambulatory Visit: Payer: Self-pay | Admitting: Internal Medicine

## 2020-09-12 LAB — COMPLETE METABOLIC PANEL WITH GFR
AG Ratio: 1.7 (calc) (ref 1.0–2.5)
ALT: 26 U/L (ref 6–29)
AST: 19 U/L (ref 10–35)
Albumin: 4.3 g/dL (ref 3.6–5.1)
Alkaline phosphatase (APISO): 115 U/L (ref 37–153)
BUN: 18 mg/dL (ref 7–25)
CO2: 29 mmol/L (ref 20–32)
Calcium: 9.6 mg/dL (ref 8.6–10.4)
Chloride: 102 mmol/L (ref 98–110)
Creat: 0.55 mg/dL (ref 0.50–1.05)
Globulin: 2.6 g/dL (calc) (ref 1.9–3.7)
Glucose, Bld: 86 mg/dL (ref 65–99)
Potassium: 4.3 mmol/L (ref 3.5–5.3)
Sodium: 140 mmol/L (ref 135–146)
Total Bilirubin: 0.4 mg/dL (ref 0.2–1.2)
Total Protein: 6.9 g/dL (ref 6.1–8.1)
eGFR: 101 mL/min/{1.73_m2} (ref >=60)

## 2020-09-12 LAB — VITAMIN D 25 HYDROXY (VIT D DEFICIENCY, FRACTURES): Vit D, 25-Hydroxy: 68 ng/mL (ref 30–100)

## 2020-09-12 LAB — CBC WITH DIFFERENTIAL/PLATELET
Absolute Monocytes: 370 cells/uL (ref 200–950)
Basophils Absolute: 29 cells/uL (ref 0–200)
Basophils Relative: 0.6 %
Eosinophils Absolute: 62 cells/uL (ref 15–500)
Eosinophils Relative: 1.3 %
HCT: 44.8 % (ref 35.0–45.0)
Hemoglobin: 15.1 g/dL (ref 11.7–15.5)
Lymphs Abs: 1805 cells/uL (ref 850–3900)
MCH: 31.8 pg (ref 27.0–33.0)
MCHC: 33.7 g/dL (ref 32.0–36.0)
MCV: 94.3 fL (ref 80.0–100.0)
MPV: 10.5 fL (ref 7.5–12.5)
Monocytes Relative: 7.7 %
Neutro Abs: 2534 cells/uL (ref 1500–7800)
Neutrophils Relative %: 52.8 %
Platelets: 268 10*3/uL (ref 140–400)
RBC: 4.75 10*6/uL (ref 3.80–5.10)
RDW: 12.2 % (ref 11.0–15.0)
Total Lymphocyte: 37.6 %
WBC: 4.8 10*3/uL (ref 3.8–10.8)

## 2020-09-12 LAB — MAGNESIUM: Magnesium: 2.1 mg/dL (ref 1.5–2.5)

## 2020-09-12 LAB — INSULIN, RANDOM: Insulin: 26.1 u[IU]/mL — ABNORMAL HIGH

## 2020-09-12 LAB — LIPID PANEL
Cholesterol: 206 mg/dL — ABNORMAL HIGH (ref <200)
HDL: 49 mg/dL — ABNORMAL LOW (ref >=50)
LDL Cholesterol (Calc): 114 mg/dL (calc) — ABNORMAL HIGH
Non-HDL Cholesterol (Calc): 157 mg/dL (calc) — ABNORMAL HIGH (ref <130)
Total CHOL/HDL Ratio: 4.2 (calc) (ref <5.0)
Triglycerides: 322 mg/dL — ABNORMAL HIGH (ref <150)

## 2020-09-12 LAB — TSH: TSH: 2.11 mIU/L (ref 0.40–4.50)

## 2020-09-12 LAB — HEMOGLOBIN A1C
Hgb A1c MFr Bld: 5.4 % of total Hgb (ref <5.7)
Mean Plasma Glucose: 108 mg/dL
eAG (mmol/L): 6 mmol/L

## 2020-09-12 MED ORDER — ROSUVASTATIN CALCIUM 20 MG PO TABS: ORAL_TABLET | ORAL | 3 refills | Status: DC

## 2020-09-12 NOTE — Progress Notes (Signed)
============================================================ -   Test results slightly outside the reference range are not unusual. If there is anything important, I will review this with you,  otherwise it is considered normal test values.  If you have further questions,  please do not hesitate to contact me at the office or via My Chart.  ============================================================ ============================================================  -  Total Chol = 206 - Still elevated          (  Ideal or Goal is less than 180  !  )   - and   - Bad / Dangerous LDL Chol = 114 - Also way too high  !           (  Ideal or Goal is less than 70  !  )   - So sent in new Rx low dose  Crestor to replace Pravastatin  ============================================================ ============================================================  - Also,Triglycerides (   322    ) or fats in blood are too high  (goal is less than 150)    - Recommend avoid fried & greasy foods,  sweets / candy,   - Avoid white rice  (brown or wild rice or Quinoa is OK),   - Avoid white potatoes  (sweet potatoes are OK)   - Avoid anything made from white flour  - bagels, doughnuts, rolls, buns, biscuits, white and   wheat breads, pizza crust and traditional  pasta made of white flour & egg white  - (vegetarian pasta or spinach or wheat pasta is OK).    - Multi-grain bread is OK - like multi-grain flat bread or  sandwich thins.   - Avoid alcohol in excess.   - Exercise is also important. ============================================================ ============================================================  - A1c - is Normal - Great - No Diabetes  ! ============================================================ ============================================================  - Vitamin D =- 68 -  Excellent ============================================================ ============================================================  - All Else - CBC - Kidneys - Electrolytes - Liver  & Thyroid    - all  Normal / OK ============================================================ ============================================================

## 2020-09-21 ENCOUNTER — Encounter: Payer: Self-pay | Admitting: Internal Medicine

## 2020-10-31 ENCOUNTER — Other Ambulatory Visit: Payer: Self-pay | Admitting: Adult Health

## 2020-10-31 DIAGNOSIS — R001 Bradycardia, unspecified: Secondary | ICD-10-CM

## 2020-10-31 DIAGNOSIS — F3342 Major depressive disorder, recurrent, in full remission: Secondary | ICD-10-CM

## 2020-10-31 DIAGNOSIS — I10 Essential (primary) hypertension: Secondary | ICD-10-CM

## 2020-11-21 DIAGNOSIS — Z961 Presence of intraocular lens: Secondary | ICD-10-CM | POA: Diagnosis not present

## 2020-11-21 DIAGNOSIS — H43812 Vitreous degeneration, left eye: Secondary | ICD-10-CM | POA: Diagnosis not present

## 2020-12-21 ENCOUNTER — Other Ambulatory Visit: Payer: Self-pay | Admitting: Nurse Practitioner

## 2020-12-21 DIAGNOSIS — I10 Essential (primary) hypertension: Secondary | ICD-10-CM

## 2020-12-21 DIAGNOSIS — R001 Bradycardia, unspecified: Secondary | ICD-10-CM

## 2020-12-21 DIAGNOSIS — F3342 Major depressive disorder, recurrent, in full remission: Secondary | ICD-10-CM

## 2021-01-27 ENCOUNTER — Encounter: Payer: Medicare Other | Admitting: Internal Medicine

## 2021-02-24 NOTE — Progress Notes (Signed)
Complete physical  Assessment:   [name] was seen today for medicare wellness and follow-up.  Diagnoses and all orders for this visit:  Encounter for general adult physical examination with abnormal findings Due annually  -     EKG 12-Lead -     [place], RETROPERITNL ABD,  LTD  Essential hypertension Continue medication Monitor blood pressure at home; call if consistently over 130/80 Continue DASH diet.   Reminder to go to the ER if any CP, SOB, nausea, dizziness, severe HA, changes vision/speech, left arm numbness and tingling and jaw pain. -     CBC with Differential/Platelet -     COMPLETE METABOLIC PANEL WITH GFR -     Magnesium -     TSH -     EKG 12-Lead   Fatty liver Maintain normal weight, avoid alcohol/tylenol, will monitor LFTs -     COMPLETE METABOLIC PANEL WITH GFR  Vitamin D deficiency -     VITAMIN D 25 Hydroxy (Vit-D Deficiency, Fractures)  Other abnormal glucose Recent A1Cs at goal Discussed diet/exercise, weight management  A1c  Medication management -     CBC with Differential/Platelet -     COMPLETE METABOLIC PANEL WITH GFR -     Magnesium  Hyperlipidemia, unspecified hyperlipidemia type Continue medications Continue low cholesterol diet and exercise.  Check lipid panel.  -     Lipid panel -     TSH  Depression, major, recurrent, in complete remission (HCC) In remission on zoloft; has failed attempts to taper; continue med and monitor Lifestyle discussed: diet/exerise, sleep hygiene, stress management, hydration  History of adenomatous polyp of colon Due for colonoscopy, will call to schedule  BMI 24.0-24.9,adult Continue to recommend diet heavy in fruits and veggies and low in animal meats, cheeses, and dairy products, appropriate calorie intake Discuss exercise recommendations routinely Continue to monitor weight at each visit      Over 40 minutes of exam, counseling, chart review and critical decision making was performed Future  Appointments  Date Time Provider [place]  05/04/2021 11:00 AM Magda Bernheim, NP [facility] None  03/03/2022 10:00 AM Magda Bernheim, NP [facility] None       Subjective:  Ruth Ward is a 66 y.o. female who presents for complete physical examination   Very pleasant female who states is doing well and has no medical concerns today.   She has hx of recurrent major depression currently well controlled by zoloft 100 mg.   BMI is Body mass index is 31.18 kg/m., she has been working on diet and exercise.She is working on diet and exercise Wt Readings from Last 3 Encounters:  02/26/21 193 lb 3.2 oz (87.6 kg)  09/11/20 184 lb 9.6 oz (83.7 kg)  05/01/20 180 lb (81.6 kg)   Her blood pressure has been controlled at home, today their BP is BP: 132/82 BP Readings from Last 3 Encounters:  02/26/21 132/82  09/11/20 118/76  05/01/20 132/80    She does workout. She denies chest pain, shortness of breath, dizziness.   She is on cholesterol medication (rosuvastatin 20 mg daily) and denies myalgias. Her cholesterol is not at goal of LDL <100. The cholesterol last visit was:   Lab Results  Component Value Date   CHOL 206 (H) 09/11/2020   HDL 49 (L) 09/11/2020   LDLCALC 114 (H) 09/11/2020   TRIG 322 (H) 09/11/2020   CHOLHDL 4.2 09/11/2020    She has been working on diet and exercise for glucose management, and  denies paresthesia of the feet, polydipsia, polyuria, visual disturbances, vomiting and weight loss. Last A1C in the office was:  Lab Results  Component Value Date   HGBA1C 5.4 09/11/2020   Last GFR: Lab Results  Component Value Date   GFRNONAA 101 05/01/2020   Patient is on Vitamin D supplement, was taking 10000 IU daily for several years, has since reduced to 5000 IU daily.  Lab Results  Component Value Date   VD25OH 68 09/11/2020      Medication Review: Current Outpatient Medications on File Prior to Visit  Medication Sig Dispense Refill   Ascorbic Acid  (VITAMIN C) 1000 MG tablet Take 1,000 mg by mouth daily.     aspirin 81 MG tablet Take 81 mg by mouth daily.     atenolol (TENORMIN) 25 MG tablet Take 1 tablet by mouth once daily for blood pressure 90 tablet 0   Cholecalciferol (VITAMIN D3) 125 MCG (5000 UT) TABS Take      1 capsule       Daily     hydrochlorothiazide (HYDRODIURIL) 25 MG tablet Take 1 tablet Daily for BP &  Fluid Retention / Ankle Swelling                            /                  TAKE 1 TABLET BY MOUTH     ONCE DAILY 90 tablet 0   Multiple Vitamin (MULTIVITAMIN WITH MINERALS) TABS tablet Take 1 tablet by mouth daily.     Omega-3 Fatty Acids (FISH OIL) 1000 MG CAPS Take by mouth.     rosuvastatin (CRESTOR) 20 MG tablet Take  1 tablet  Daily  for Cholesterol 90 tablet 3   sertraline (ZOLOFT) 100 MG tablet Take  1 tablet  Daily for Mood                            /               Take 1 tablet        by mouth       once daily 90 tablet 3   Zinc 50 MG TABS Take by mouth daily.     dicyclomine (BENTYL) 10 MG capsule TAKE 1 CAPSULE BY MOUTH 4 TIMES DAILY FOR 14 DAYS (Patient not taking: Reported on 02/26/2021) 56 capsule 1   No current facility-administered medications on file prior to visit.    Allergies  Allergen Reactions   Atorvastatin Nausea Only    Current Problems (verified) Patient Active Problem List   Diagnosis Date Noted   COVID 03/19/2020   Osteopenia 07/12/2019   History of adenomatous polyp of colon 04/04/2019   BMI 24.0-24.9, adult 01/16/2018   Fatty liver 03/01/2017   Depression, major, recurrent, in complete remission ([place]) 05/02/2014   Other abnormal glucose 10/09/2013   Medication management 10/09/2013   Hypertension    Hyperlipidemia    Vitamin D deficiency     Screening Tests Immunization History  Administered Date(s) Administered   Influenza, High Dose Seasonal PF 12/27/2018, 11/23/2019   Influenza-Unspecified 11/15/2012, 12/19/2017   Moderna Sars-Covid-2 Vaccination 03/23/2019,  04/18/2019, 11/23/2019   PPD Test 03/07/2013, 05/02/2014, 05/22/2015, 10/10/2017   Pneumococcal-Unspecified 02/15/2009   Td 02/15/2002   Tdap 03/02/2012   Zoster, Live 12/02/2015   Preventative care: Last colonoscopy: 04/2017, adenomatous polyps, will call  to schedule Last mammogram: 03/02/2019 will call to schedule Last pap smear/pelvic exam: 11/2015 neg HPV, declines further  DEXA: 07/12/19 osteopenia  Prior vaccinations: TD or Tdap: 2014  Influenza: 12/2020  Pneumococcal: due 1 year after prevnar  Prevnar13: Defer due to covid 19 vaccine scheduled Shingles/Zostavax: 2017,  Covid 19: 1/2, next scheduled this week  Names of Other Physician/Practitioners you currently use: 1. [facility] here for primary care 2. Dr. Katy Fitch, eye doctor, last visit 2022 3. Dr. Melina Copa, dentist, last visit 2021, goes q51m  Patient Care Team: Unk Pinto, MD as PCP - General (Internal Medicine) Irene Shipper, MD as Consulting Physician (Gastroenterology)  SURGICAL HISTORY She  has a past surgical history that includes Cesarean section (1975 and 1980); Tubal ligation; Abdominal surgery (09/2016); and I & D extremity (Right, 12/14/2018). FAMILY HISTORY Her family history includes Cancer in her father; Diabetes in her brother, brother, mother, and another family member; Heart disease in her father; Hypertension in her brother, father, and mother; Prostate cancer in her father. SOCIAL HISTORY She  reports that she quit smoking about 35 years ago. Her smoking use included cigarettes. She has never used smokeless tobacco. She reports that she does not drink alcohol and does not use drugs.   Review of Systems  Constitutional:  Negative for malaise/fatigue and weight loss.  HENT:  Negative for hearing loss and tinnitus.   Eyes:  Negative for blurred vision and double vision.  Respiratory:  Negative for cough, sputum production, shortness of breath and wheezing.    Cardiovascular:  Negative for chest pain, palpitations, orthopnea, claudication, leg swelling and PND.  Gastrointestinal:  Negative for abdominal pain, blood in stool, constipation, diarrhea, heartburn, melena, nausea and vomiting.  Genitourinary: Negative.   Musculoskeletal:  Negative for falls, joint pain and myalgias.  Skin:  Negative for rash.  Neurological:  Negative for dizziness, tingling, sensory change, weakness and headaches.  Endo/Heme/Allergies:  Positive for environmental allergies. Negative for polydipsia.  Psychiatric/Behavioral: Negative.  Negative for depression, memory loss, substance abuse and suicidal ideas. The patient is not nervous/anxious and does not have insomnia.   All other systems reviewed and are negative.   Objective:     Today's Vitals   02/26/21 1010  BP: 132/82  Pulse: 66  Temp: 97.7 F (36.5 C)  SpO2: 97%  Weight: 193 lb 3.2 oz (87.6 kg)  Height: 5\' 6"  (1.676 m)   Body mass index is 31.18 kg/m.  General appearance: alert, no distress, WD/WN, female HEENT: normocephalic, sclerae anicteric, TMs pearly, nares patent, no discharge or erythema,  Oral cavity: Mask in place; oral exam deferred Neck: supple, no lymphadenopathy, no thyromegaly, no masses Heart: RRR, normal S1, S2, no murmurs Lungs: CTA bilaterally, no wheezes, rhonchi, or rales Abdomen: +bs, soft, non tender, non distended, no masses, no hepatomegaly, no splenomegaly Musculoskeletal: nontender, no swelling, no obvious deformity Extremities: no edema, no cyanosis, no clubbing Pulses: 2+ symmetric, upper and lower extremities, normal cap refill Neurological: alert, oriented x 3, CN2-12 intact, strength normal upper extremities and lower extremities, sensation normal throughout, DTRs 2+ throughout, no cerebellar signs, gait normal Psychiatric: normal affect, behavior normal, pleasant   EKG: Sinus bradycardia, no ST changes ( on Atenolol)    Magda Bernheim, NP   02/26/2021

## 2021-02-26 ENCOUNTER — Encounter: Payer: Self-pay | Admitting: Nurse Practitioner

## 2021-02-26 ENCOUNTER — Other Ambulatory Visit: Payer: Self-pay

## 2021-02-26 ENCOUNTER — Ambulatory Visit (INDEPENDENT_AMBULATORY_CARE_PROVIDER_SITE_OTHER): Payer: Medicare Other | Admitting: Nurse Practitioner

## 2021-02-26 VITALS — BP 132/82 | HR 66 | Temp 97.7°F | Ht 66.0 in | Wt 193.2 lb

## 2021-02-26 DIAGNOSIS — R7309 Other abnormal glucose: Secondary | ICD-10-CM

## 2021-02-26 DIAGNOSIS — Z0000 Encounter for general adult medical examination without abnormal findings: Secondary | ICD-10-CM

## 2021-02-26 DIAGNOSIS — E782 Mixed hyperlipidemia: Secondary | ICD-10-CM | POA: Diagnosis not present

## 2021-02-26 DIAGNOSIS — F3342 Major depressive disorder, recurrent, in full remission: Secondary | ICD-10-CM

## 2021-02-26 DIAGNOSIS — E559 Vitamin D deficiency, unspecified: Secondary | ICD-10-CM

## 2021-02-26 DIAGNOSIS — Z6824 Body mass index (BMI) 24.0-24.9, adult: Secondary | ICD-10-CM

## 2021-02-26 DIAGNOSIS — Z1389 Encounter for screening for other disorder: Secondary | ICD-10-CM

## 2021-02-26 DIAGNOSIS — Z0001 Encounter for general adult medical examination with abnormal findings: Secondary | ICD-10-CM

## 2021-02-26 DIAGNOSIS — Z136 Encounter for screening for cardiovascular disorders: Secondary | ICD-10-CM | POA: Diagnosis not present

## 2021-02-26 DIAGNOSIS — I10 Essential (primary) hypertension: Secondary | ICD-10-CM | POA: Diagnosis not present

## 2021-02-26 DIAGNOSIS — K760 Fatty (change of) liver, not elsewhere classified: Secondary | ICD-10-CM

## 2021-02-26 DIAGNOSIS — Z79899 Other long term (current) drug therapy: Secondary | ICD-10-CM

## 2021-02-26 DIAGNOSIS — Z86010 Personal history of colonic polyps: Secondary | ICD-10-CM

## 2021-02-26 NOTE — Patient Instructions (Signed)

## 2021-02-27 LAB — CBC WITH DIFFERENTIAL/PLATELET
Absolute Monocytes: 383 cells/uL (ref 200–950)
Basophils Absolute: 31 cells/uL (ref 0–200)
Basophils Relative: 0.6 %
Eosinophils Absolute: 61 cells/uL (ref 15–500)
Eosinophils Relative: 1.2 %
HCT: 43.8 % (ref 35.0–45.0)
Hemoglobin: 15 g/dL (ref 11.7–15.5)
Lymphs Abs: 2132 cells/uL (ref 850–3900)
MCH: 32.3 pg (ref 27.0–33.0)
MCHC: 34.2 g/dL (ref 32.0–36.0)
MCV: 94.2 fL (ref 80.0–100.0)
MPV: 10.6 fL (ref 7.5–12.5)
Monocytes Relative: 7.5 %
Neutro Abs: 2494 cells/uL (ref 1500–7800)
Neutrophils Relative %: 48.9 %
Platelets: 242 10*3/uL (ref 140–400)
RBC: 4.65 10*6/uL (ref 3.80–5.10)
RDW: 12.9 % (ref 11.0–15.0)
Total Lymphocyte: 41.8 %
WBC: 5.1 10*3/uL (ref 3.8–10.8)

## 2021-02-27 LAB — HEMOGLOBIN A1C
Hgb A1c MFr Bld: 5.6 % of total Hgb (ref <5.7)
Mean Plasma Glucose: 114 mg/dL
eAG (mmol/L): 6.3 mmol/L

## 2021-02-27 LAB — TSH: TSH: 2.46 mIU/L (ref 0.40–4.50)

## 2021-02-27 LAB — URINALYSIS, ROUTINE W REFLEX MICROSCOPIC
Bilirubin Urine: NEGATIVE
Glucose, UA: NEGATIVE
Hgb urine dipstick: NEGATIVE
Ketones, ur: NEGATIVE
Leukocytes,Ua: NEGATIVE
Nitrite: NEGATIVE
Protein, ur: NEGATIVE
Specific Gravity, Urine: 1.015 (ref 1.001–1.035)
pH: 6.5 (ref 5.0–8.0)

## 2021-02-27 LAB — COMPLETE METABOLIC PANEL WITH GFR
AG Ratio: 2 (calc) (ref 1.0–2.5)
ALT: 28 U/L (ref 6–29)
AST: 23 U/L (ref 10–35)
Albumin: 4.4 g/dL (ref 3.6–5.1)
Alkaline phosphatase (APISO): 84 U/L (ref 37–153)
BUN: 19 mg/dL (ref 7–25)
CO2: 29 mmol/L (ref 20–32)
Calcium: 10 mg/dL (ref 8.6–10.4)
Chloride: 102 mmol/L (ref 98–110)
Creat: 0.62 mg/dL (ref 0.50–1.05)
Globulin: 2.2 g/dL (calc) (ref 1.9–3.7)
Glucose, Bld: 97 mg/dL (ref 65–99)
Potassium: 4.3 mmol/L (ref 3.5–5.3)
Sodium: 140 mmol/L (ref 135–146)
Total Bilirubin: 0.5 mg/dL (ref 0.2–1.2)
Total Protein: 6.6 g/dL (ref 6.1–8.1)
eGFR: 98 mL/min/{1.73_m2} (ref >=60)

## 2021-02-27 LAB — MICROALBUMIN / CREATININE URINE RATIO
Creatinine, Urine: 51 mg/dL (ref 20–275)
Microalb Creat Ratio: 8 mcg/mg creat (ref <30)
Microalb, Ur: 0.4 mg/dL

## 2021-02-27 LAB — MAGNESIUM: Magnesium: 2.2 mg/dL (ref 1.5–2.5)

## 2021-02-27 LAB — LIPID PANEL
Cholesterol: 134 mg/dL (ref <200)
HDL: 57 mg/dL (ref >=50)
LDL Cholesterol (Calc): 55 mg/dL (calc)
Non-HDL Cholesterol (Calc): 77 mg/dL (calc) (ref <130)
Total CHOL/HDL Ratio: 2.4 (calc) (ref <5.0)
Triglycerides: 132 mg/dL (ref <150)

## 2021-02-27 LAB — VITAMIN D 25 HYDROXY (VIT D DEFICIENCY, FRACTURES): Vit D, 25-Hydroxy: 65 ng/mL (ref 30–100)

## 2021-03-23 ENCOUNTER — Other Ambulatory Visit: Payer: Self-pay | Admitting: Internal Medicine

## 2021-03-23 DIAGNOSIS — Z1231 Encounter for screening mammogram for malignant neoplasm of breast: Secondary | ICD-10-CM

## 2021-03-25 ENCOUNTER — Ambulatory Visit
Admission: RE | Admit: 2021-03-25 | Discharge: 2021-03-25 | Disposition: A | Discharge status: Home / Self Care | Payer: Medicare Other | Source: Ambulatory Visit | Attending: Internal Medicine | Admitting: Internal Medicine

## 2021-03-25 ENCOUNTER — Other Ambulatory Visit: Payer: Self-pay

## 2021-03-25 DIAGNOSIS — Z1231 Encounter for screening mammogram for malignant neoplasm of breast: Secondary | ICD-10-CM

## 2021-03-30 ENCOUNTER — Encounter: Payer: Self-pay | Admitting: Internal Medicine

## 2021-03-30 NOTE — Progress Notes (Signed)
° ° °  Future Appointments  Date Time Provider Department  03/31/2021 11:30 AM Unk Pinto, MD [facility]  05/04/2021 11:00 AM Magda Bernheim, NP [facility]  03/03/2022 10:00 AM Magda Bernheim, NP [facility]    History of Present Illness:     Pat\ient is a very nice 66 yo  MWF with HTN, HLD who presents for 1 month f/u from recent annual exam. Labs (including nl TSH) and Lipid profile was also WNL on Rosuvastatin.Today she presents with concerns re: her weight  (BMI 31 +) /. Patient admits poor impulse control &  overeating .                                                                 Medications    atenolol 25 MG, Take 1 tablet by mouth once daily    hydrochlorothiazide 25 MG , Take 1 tablet Daily    rosuvastatin 20 MG , Take  1 tablet  Daily     aspirin 81 MG , Take  daily.   VITAMIN C 1000 MG , Take  daily.   VITAMIN D 5000 , Take      1 capsule       Daily   Multiple Vitamin, Take 1 tablet daily.   Omega-3  FISH OIL 1000 MG CAPS, Take daily   sertraline 100 MG tablet, Take  1 tablet  Daily    Zinc 50 MG TABS, Take  daily.  Problem list She has Hypertension; Hyperlipidemia; Vitamin D deficiency; Other abnormal glucose; Medication management; Depression, major, recurrent, in complete remission ([place]); Fatty liver; BMI 24.0-24.9, adult; History of adenomatous polyp of colon; Osteopenia; and COVID on their problem list.   Observations/Objective:  BP 116/72    Pulse 69    Temp 97.9 F (36.6 C)    Resp 16    Ht 5\' 6"  (1.676 m)    Wt 193 lb 12.8 oz (87.9 kg)    SpO2 97%    BMI 31.28 kg/m   HEENT - WNL. Neck - supple.  Chest - Clear equal BS. Cor - Nl HS. RRR w/o sig MGR. PP 1(+). No edema. MS- FROM w/o deformities.  Gait Nl. Neuro -  Nl w/o focal abnormalities.   Assessment and Plan:  1. Essential hypertension   2. Class 1 obesity due to excess calories with serious comorbidity  and body mass index (BMI) of 31.0 to 31.9 in adult  - phentermine 37.5 MG tablet;  Take  1/2 to 1 tablet every Morning Dispense:  # 90   - topiramate 50 MG tablet;  Take 1/2 to 1 tablet 2 x /day    Dispense: 180 tablet; Refill: 1  Discussed book "How not to Die" & cookbook by Dr Marcell Barlow.    Follow Up Instructions:        I discussed the assessment and treatment plan with the patient. The patient was provided an opportunity to ask questions and all were answered. The patient agreed with the plan and demonstrated an understanding of the instructions.       The patient was advised to call back or seek an in-person evaluation if the symptoms worsen or if the condition fails to improve as anticipated.    Kirtland Bouchard, MD

## 2021-03-31 ENCOUNTER — Encounter: Payer: Self-pay | Admitting: Internal Medicine

## 2021-03-31 ENCOUNTER — Other Ambulatory Visit: Payer: Self-pay

## 2021-03-31 ENCOUNTER — Ambulatory Visit (INDEPENDENT_AMBULATORY_CARE_PROVIDER_SITE_OTHER): Payer: Medicare Other | Admitting: Internal Medicine

## 2021-03-31 VITALS — BP 116/72 | HR 69 | Temp 97.9°F | Resp 16 | Ht 66.0 in | Wt 193.8 lb

## 2021-03-31 DIAGNOSIS — Z6831 Body mass index (BMI) 31.0-31.9, adult: Secondary | ICD-10-CM

## 2021-03-31 DIAGNOSIS — I10 Essential (primary) hypertension: Secondary | ICD-10-CM | POA: Diagnosis not present

## 2021-03-31 DIAGNOSIS — E6609 Other obesity due to excess calories: Secondary | ICD-10-CM

## 2021-03-31 MED ORDER — PHENTERMINE HCL 37.5 MG PO TABS: ORAL_TABLET | ORAL | 1 refills | Status: DC

## 2021-03-31 MED ORDER — TOPIRAMATE 50 MG PO TABS: ORAL_TABLET | ORAL | 1 refills | Status: DC

## 2021-04-30 NOTE — Progress Notes (Signed)
?MEDICARE ANNUAL WELLNESS VISIT AND FOLLOW UP ? ?Assessment:  ? ?Teigan was seen today for medicare wellness and follow-up. ? ?Diagnoses and all orders for this visit: ? ?Encounter for Medicare annual wellness, second ?Yearly ? ?Essential hypertension ?Continue medication ?Monitor blood pressure at home; call if consistently over 130/80 ?Continue DASH diet.   ?Reminder to go to the ER if any CP, SOB, nausea, dizziness, severe HA, changes vision/speech, left arm numbness and tingling and jaw pain. ? ? ?Vitamin D deficiency ?Continue supplementation to maintain goal of 70-100 ?Taking Vitamin D 5,000 IU daily ? ? ?Other abnormal glucose ?Recent A1Cs at goal ?Discussed diet/exercise, weight management  ?Defer A1C ? ? ?Hyperlipidemia, unspecified hyperlipidemia type ?Continue medication: Pravastatin '40mg'$  ?Continue low cholesterol diet and exercise.  ? ? ?Depression, major, recurrent, in complete remission ([place]) ?In remission on zoloft; has failed attempts to taper; continue med and monitor ?Lifestyle discussed: diet/exerise, sleep hygiene, stress management, hydration ? ?History of adenomatous polyp of colon ?UTD on colonoscopies; due 2024 ?Discussed with patient ? ?Overweight BMI 29.0-29.9,adult ?Continue to recommend diet heavy in fruits and veggies and low in animal meats, cheeses, and dairy products, appropriate calorie intake ?Discuss exercise recommendations routinely ?Continue Phentermine and Topamax ?F/U in 3 months ? ?Estrogen deficiency ?Dexa UTD ? ?Fatty liver ?Maintain normal weight, avoid alcohol/tylenol, will monitor LFTs ? ? ?Medication management ?Continued ? ?Over 40 minutes of face to face interview, exam, counseling, chart review and critical decision making was performed. ? ?Future Appointments  ?Date Time Provider [place]  ?03/03/2022 10:00 AM Polette Nofsinger, Townsend Roger, NP [facility] None  ?05/05/2022 11:00 AM Magda Bernheim, NP [facility] None  ? ? ? ?Plan:  ? ?During the course of the visit the patient  was educated and counseled about appropriate screening and preventive services including:  ? ?Pneumococcal vaccine  ?Prevnar 13 ?Influenza vaccine ?Td vaccine ?Screening electrocardiogram ?Bone densitometry screening ?Colorectal cancer screening ?Diabetes screening ?Glaucoma screening ?Nutrition counseling  ?Advanced directives: requested ? ? ?Subjective:  ?Coriana Angello is a 66 y.o. female who presents for Medicare Annual Wellness Visit and 3 month follow up.  ? ?She is currently on Phentermine and Topamax for weight loss and doing well.  States she is feeling great.  ? ?BMI is Body mass index is 29.99 kg/m?., she has been working on diet and exercise. She is down 8 pounds in 2 months ?Wt Readings from Last 3 Encounters:  ?05/04/21 185 lb 12.8 oz (84.3 kg)  ?03/31/21 193 lb 12.8 oz (87.9 kg)  ?02/26/21 193 lb 3.2 oz (87.6 kg)  ? ?Her blood pressure has been controlled at home, today their BP is BP: 124/88  ?BP Readings from Last 3 Encounters:  ?05/04/21 124/88  ?03/31/21 116/72  ?02/26/21 132/82  ? ? ?She does workout. She denies chest pain, shortness of breath, dizziness.  ? ?She is on cholesterol medication (pravastatin 40 mg daily) and denies myalgias. Her cholesterol is at goal of LDL <100. The cholesterol last visit was:   ?Lab Results  ?Component Value Date  ? CHOL 134 02/26/2021  ? HDL 57 02/26/2021  ? [place] 55 02/26/2021  ? TRIG 132 02/26/2021  ? CHOLHDL 2.4 02/26/2021  ? ? She has been working on diet and exercise for glucose management, and denies paresthesia of the feet, polydipsia, polyuria, visual disturbances, vomiting and weight loss. Last A1C in the office was:  ?Lab Results  ?Component Value Date  ? HGBA1C 5.6 02/26/2021  ? ?Last GFR: ?Lab Results  ?Component Value Date  ?  GFRNONAA 101 05/01/2020  ? ?Patient is on Vitamin D supplement, was taking 10000 IU daily for several years, has since reduced to 5000 IU daily.  ?Lab Results  ?Component Value Date  ? VD25OH 65 02/26/2021  ?   ?She has hx  of recurrent major depression currently well controlled by zoloft 100 mg.  ? ? ?Medication Review: ?Current Outpatient Medications on File Prior to Visit  ?Medication Sig Dispense Refill  ? Ascorbic Acid (VITAMIN C) 1000 MG tablet Take 1,000 mg by mouth daily.    ? aspirin 81 MG tablet Take 81 mg by mouth daily.    ? Cholecalciferol (VITAMIN D3) 125 MCG (5000 UT) TABS Take      1 capsule       Daily    ? Multiple Vitamin (MULTIVITAMIN WITH MINERALS) TABS tablet Take 1 tablet by mouth daily.    ? Omega-3 Fatty Acids (FISH OIL) 1000 MG CAPS Take by mouth.    ? phentermine (ADIPEX-P) 37.5 MG tablet Take 1/2 to 1 tablet every Morning for Dieting & Weight Loss 90 tablet 1  ? rosuvastatin (CRESTOR) 20 MG tablet Take  1 tablet  Daily  for Cholesterol 90 tablet 3  ? sertraline (ZOLOFT) 100 MG tablet Take  1 tablet  Daily for Mood                            /               Take 1 tablet        by mouth       once daily 90 tablet 3  ? topiramate (TOPAMAX) 50 MG tablet Take 1/2 to 1 tablet 2 x /day at Suppertime & Bedtime for Dieting & Weight Loss 180 tablet 1  ? Zinc 50 MG TABS Take by mouth daily.    ? ?No current facility-administered medications on file prior to visit.  ? ? ?Allergies  ?Allergen Reactions  ? Atorvastatin Nausea Only  ? ? ?Current Problems (verified) ?Patient Active Problem List  ? Diagnosis Date Noted  ? COVID 03/19/2020  ? Osteopenia 07/12/2019  ? History of adenomatous polyp of colon 04/04/2019  ? Fatty liver 03/01/2017  ? Depression, major, recurrent, in complete remission ([place]) 05/02/2014  ? Medication management 10/09/2013  ? Hypertension   ? Hyperlipidemia   ? Vitamin D deficiency   ? ? ?Screening Tests ?Immunization History  ?Administered Date(s) Administered  ? Influenza, High Dose Seasonal PF 12/27/2018, 11/23/2019  ? Influenza-Unspecified 11/15/2012, 12/19/2017, 12/17/2020  ? Moderna SARS-COV2 Booster Vaccination 11/23/2019  ? Moderna Sars-Covid-2 Vaccination 03/23/2019, 04/18/2019, 11/23/2019  ?  PPD Test 03/07/2013, 05/02/2014, 05/22/2015, 10/10/2017  ? Pension scheme manager 20yr & up 11/28/2020  ? Pneumococcal-Unspecified 02/15/2009  ? Td 02/15/2002  ? Tdap 03/02/2012  ? Zoster, Live 12/02/2015  ? ?Preventative care: ?Last colonoscopy: 04/2017, adenomatous polyps, due 2024 Dr. SFuller Plan?Last mammogram: 03/25/21 ?Last pap smear/pelvic exam: 11/2015 neg HPV, declines further  ?DEXA: 2021 osteopenia ? ?Prior vaccinations: ?TD or Tdap: 2014  ?Influenza: 11/2019  ?Pneumococcal: due 1 year after prevnar  ?Prevnar13: Defer due to covid 19 vaccine scheduled ?Shingles/Zostavax: 2017,  ?Covid 19: 1/2, next scheduled this week ? ?Names of Other Physician/Practitioners you currently use: ?1. [facility] ?2. Dr. GShanon Rosser eye doctor, last visit 2021 ?3. Dr. BMelina Copa dentist, last visit 2022, goes q667m ?Patient Care Team: ?McUnk PintoMD  as PCP - General (Internal Medicine) ?Irene Shipper, MD as Consulting Physician (Gastroenterology) ? ?SURGICAL HISTORY ?She  has a past surgical history that includes Cesarean section (1975 and 1980); Tubal ligation; Abdominal surgery (09/2016); and I & D extremity (Right, 12/14/2018). ?FAMILY HISTORY ?Her family history includes Cancer in her father; Diabetes in her brother, brother, mother, and another family member; Heart disease in her father; Hypertension in her brother, father, and mother; Prostate cancer in her father. ?SOCIAL HISTORY ?She  reports that she quit smoking about 35 years ago. Her smoking use included cigarettes. She has never used smokeless tobacco. She reports that she does not drink alcohol and does not use drugs. ? ? ?MEDICARE WELLNESS OBJECTIVES: ?Physical activity: Current Exercise Habits: Home exercise routine, Type of exercise: walking, Time (Minutes): 40, Frequency (Times/Week): 3, Weekly Exercise (Minutes/Week): 120, Intensity: Mild, Exercise limited by: None identified ?Cardiac  risk factors: Cardiac Risk Factors include: advanced age (>53mn, >>65women);hypertension;dyslipidemia;obesity (BMI >30kg/m2) ?Depression/mood screen:   ?[facility]  ?Decreased I

## 2021-05-04 ENCOUNTER — Ambulatory Visit (INDEPENDENT_AMBULATORY_CARE_PROVIDER_SITE_OTHER): Payer: Medicare Other | Admitting: Nurse Practitioner

## 2021-05-04 ENCOUNTER — Ambulatory Visit: Payer: Medicare Other | Admitting: Adult Health

## 2021-05-04 ENCOUNTER — Other Ambulatory Visit: Payer: Self-pay | Admitting: Internal Medicine

## 2021-05-04 ENCOUNTER — Other Ambulatory Visit: Payer: Self-pay

## 2021-05-04 ENCOUNTER — Encounter: Payer: Self-pay | Admitting: Nurse Practitioner

## 2021-05-04 VITALS — BP 124/88 | HR 82 | Temp 97.7°F | Wt 185.8 lb

## 2021-05-04 DIAGNOSIS — E663 Overweight: Secondary | ICD-10-CM

## 2021-05-04 DIAGNOSIS — Z86010 Personal history of colonic polyps: Secondary | ICD-10-CM | POA: Diagnosis not present

## 2021-05-04 DIAGNOSIS — Z0001 Encounter for general adult medical examination with abnormal findings: Secondary | ICD-10-CM | POA: Diagnosis not present

## 2021-05-04 DIAGNOSIS — E782 Mixed hyperlipidemia: Secondary | ICD-10-CM

## 2021-05-04 DIAGNOSIS — K760 Fatty (change of) liver, not elsewhere classified: Secondary | ICD-10-CM

## 2021-05-04 DIAGNOSIS — R6889 Other general symptoms and signs: Secondary | ICD-10-CM | POA: Diagnosis not present

## 2021-05-04 DIAGNOSIS — Z23 Encounter for immunization: Secondary | ICD-10-CM

## 2021-05-04 DIAGNOSIS — I10 Essential (primary) hypertension: Secondary | ICD-10-CM

## 2021-05-04 DIAGNOSIS — Z79899 Other long term (current) drug therapy: Secondary | ICD-10-CM

## 2021-05-04 DIAGNOSIS — R7309 Other abnormal glucose: Secondary | ICD-10-CM

## 2021-05-04 DIAGNOSIS — E559 Vitamin D deficiency, unspecified: Secondary | ICD-10-CM

## 2021-05-04 DIAGNOSIS — E2839 Other primary ovarian failure: Secondary | ICD-10-CM

## 2021-05-04 DIAGNOSIS — R001 Bradycardia, unspecified: Secondary | ICD-10-CM

## 2021-05-04 DIAGNOSIS — Z6829 Body mass index (BMI) 29.0-29.9, adult: Secondary | ICD-10-CM

## 2021-07-17 ENCOUNTER — Other Ambulatory Visit: Payer: Self-pay | Admitting: Internal Medicine

## 2021-07-17 DIAGNOSIS — R001 Bradycardia, unspecified: Secondary | ICD-10-CM

## 2021-07-17 DIAGNOSIS — I10 Essential (primary) hypertension: Secondary | ICD-10-CM

## 2021-07-17 DIAGNOSIS — E6609 Other obesity due to excess calories: Secondary | ICD-10-CM

## 2021-08-10 NOTE — Progress Notes (Signed)
FOLLOW UP  Assessment and Plan:   Hypertension Well controlled with current medications  Monitor blood pressure at home; patient to call if consistently greater than 130/80 Continue DASH diet.   Reminder to go to the ER if any CP, SOB, nausea, dizziness, severe HA, changes vision/speech, left arm numbness and tingling and jaw pain. - CBC  Cholesterol Currently at goal; continue pravastatin Continue low cholesterol diet and exercise.  Check lipid panel, CMP, TSH.   Other abnormal glucose Recent A1Cs at goal Discussed diet/exercise, weight management  Weight gain, losing insurance, wants to check A1C   Overweight Long discussion about weight loss, diet, and exercise Recommended diet heavy in fruits and veggies and low in animal meats, cheeses, and dairy products, appropriate calorie intake Patient will work on starting exercise, watching portions Continue Topamax and Phentermine Will follow up in 3 months  Vitamin D Def At goal last visit; continue supplementation to maintain goal of 70-100 Defer Vit D level  Depression Well controlled currently without medication   Medication Management Continued  Continue diet and meds as discussed. Further disposition pending results of labs. Discussed med's effects and SE's.   Over 30 minutes of exam, counseling, chart review, and critical decision making was performed.   Future Appointments  Date Time Provider Department Center  03/03/2022 10:00 AM Raynelle Dick, NP [facility] None  05/05/2022 11:00 AM Raynelle Dick, NP [facility] None    ----------------------------------------------------------------------------------------------------------------------  HPI 66 y.o. female  presents for 3 month follow up on hypertension, cholesterol, glucose manageement, weight, depression/anxiety and vitamin D deficiency.   she has a diagnosis of depression/anxiety and is currently on zoloft 100 mg daily, reports symptoms are well  controlled on current regimen.   BMI is Body mass index is 28.31 kg/m., she has not been working on diet and exercise, She is more active, doing more yard work.  Drinking more water.  Limiting saturated fats and simple carbs.  Wt Readings from Last 3 Encounters:  08/11/21 175 lb 6.4 oz (79.6 kg)  05/04/21 185 lb 12.8 oz (84.3 kg)  03/31/21 193 lb 12.8 oz (87.9 kg)   Her blood pressure has been controlled at home, today their BP is BP: 128/80  BP Readings from Last 3 Encounters:  08/11/21 128/80  05/04/21 124/88  03/31/21 116/72   She does not workout. She denies chest pain, shortness of breath, dizziness.   She is on cholesterol medication Pravastatin and denies myalgias. Her LDL cholesterol is at goal. The cholesterol last visit was:   Lab Results  Component Value Date   CHOL 134 02/26/2021   HDL 57 02/26/2021   LDLCALC 55 02/26/2021   TRIG 132 02/26/2021   CHOLHDL 2.4 02/26/2021    She has not been working on diet and exercise for glucose management, and denies increased appetite, nausea, paresthesia of the feet, polydipsia, polyuria and visual disturbances. Last A1C in the office was:  Lab Results  Component Value Date   HGBA1C 5.6 02/26/2021   Patient is on Vitamin D supplement.   Lab Results  Component Value Date   VD25OH 65 02/26/2021       Current Medications:  Current Outpatient Medications on File Prior to Visit  Medication Sig   Ascorbic Acid (VITAMIN C) 1000 MG tablet Take 1,000 mg by mouth daily.   aspirin 81 MG tablet Take 81 mg by mouth daily.   atenolol (TENORMIN) 25 MG tablet Take 1 tablet by mouth once daily for blood pressure   Cholecalciferol (  VITAMIN D3) 125 MCG (5000 UT) TABS Take      1 capsule       Daily   Docusate Calcium (STOOL SOFTENER PO) Take by mouth.   hydrochlorothiazide (HYDRODIURIL) 25 MG tablet TAKE 1 TABLET BY MOUTH ONCE DAILY FOR BLOOD PRESSURE &  FLUID  RETENTION/ANKLE  SWELLING   MAGNESIUM PO Take by mouth.   Multiple Vitamin  (MULTIVITAMIN WITH MINERALS) TABS tablet Take 1 tablet by mouth daily.   Omega-3 Fatty Acids (FISH OIL) 1000 MG CAPS Take by mouth.   phentermine (ADIPEX-P) 37.5 MG tablet Take 1/2 to 1 tablet every Morning for Dieting & Weight Loss   rosuvastatin (CRESTOR) 20 MG tablet TAKE 1 TABLET BY MOUTH ONCE DAILY FOR CHOLESTEROL   sertraline (ZOLOFT) 100 MG tablet Take  1 tablet  Daily for Mood                            /               Take 1 tablet        by mouth       once daily   topiramate (TOPAMAX) 50 MG tablet TAKE 1/2 TO 1 (ONE-HALF TO ONE) TABLET BY MOUTH TWICE DAILY AT SUPPERTIME AND BEDTIME FOR DIETING AND WEIGHTLOSS   Wheat Dextrin (BENEFIBER PO) Take by mouth.   Zinc 50 MG TABS Take by mouth daily.   No current facility-administered medications on file prior to visit.     Allergies:  Allergies  Allergen Reactions   Atorvastatin Nausea Only     Medical History:  Past Medical History:  Diagnosis Date   Allergy    Anxiety    Climacteric    Depression    Fibrocystic breast    Hypercholesteremia    Hyperlipidemia    Hypertension    Thumb laceration, right, initial encounter    Vitamin D deficiency    Family history- Reviewed and unchanged Social history- Reviewed and unchanged   Review of Systems:  Review of Systems  Constitutional:  Negative for malaise/fatigue and weight loss.  HENT:  Negative for hearing loss and tinnitus.   Eyes:  Negative for blurred vision and double vision.  Respiratory:  Negative for cough, shortness of breath and wheezing.   Cardiovascular:  Negative for chest pain, palpitations, orthopnea, claudication and leg swelling.  Gastrointestinal:  Negative for abdominal pain, blood in stool, constipation, diarrhea, heartburn, melena, nausea and vomiting.  Genitourinary: Negative.   Musculoskeletal:  Negative for joint pain and myalgias.  Skin:  Negative for rash.  Neurological:  Negative for dizziness, tingling, sensory change, weakness and headaches.   Endo/Heme/Allergies:  Negative for polydipsia.  Psychiatric/Behavioral: Negative.    All other systems reviewed and are negative.     Physical Exam: BP 128/80   Pulse 72   Temp 97.9 F (36.6 C)   Wt 175 lb 6.4 oz (79.6 kg)   SpO2 98%   BMI 28.31 kg/m  Wt Readings from Last 3 Encounters:  08/11/21 175 lb 6.4 oz (79.6 kg)  05/04/21 185 lb 12.8 oz (84.3 kg)  03/31/21 193 lb 12.8 oz (87.9 kg)   General Appearance: Well nourished, in no apparent distress. Eyes: PERRLA, EOMs, conjunctiva no swelling or erythema Sinuses: No Frontal/maxillary tenderness ENT/Mouth: Ext aud canals clear, TMs without erythema, bulging. No erythema, swelling, or exudate on post pharynx.  Tonsils not swollen or erythematous. Hearing normal.  Neck: Supple, thyroid normal.  Respiratory: Respiratory effort normal, BS equal bilaterally without rales, rhonchi, wheezing or stridor.  Cardio: RRR with no MRGs. Brisk peripheral pulses without edema.  Abdomen: Soft, + BS.  Non tender, no guarding, rebound, hernias, masses. Lymphatics: Non tender without lymphadenopathy.  Musculoskeletal: Full ROM, 5/5 strength, Normal gait Skin: Warm, dry without rashes, lesions, ecchymosis.  Neuro: Cranial nerves intact. No cerebellar symptoms.  Psych: Awake and oriented X 3, normal affect, Insight and Judgment appropriate.    Raynelle Dick, NP 11:35 AM [facility]

## 2021-08-11 ENCOUNTER — Encounter: Payer: Self-pay | Admitting: Nurse Practitioner

## 2021-08-11 ENCOUNTER — Ambulatory Visit (INDEPENDENT_AMBULATORY_CARE_PROVIDER_SITE_OTHER): Payer: Medicare Other | Admitting: Nurse Practitioner

## 2021-08-11 VITALS — BP 128/80 | HR 72 | Temp 97.9°F | Wt 175.4 lb

## 2021-08-11 DIAGNOSIS — F3342 Major depressive disorder, recurrent, in full remission: Secondary | ICD-10-CM | POA: Diagnosis not present

## 2021-08-11 DIAGNOSIS — Z79899 Other long term (current) drug therapy: Secondary | ICD-10-CM | POA: Diagnosis not present

## 2021-08-11 DIAGNOSIS — I10 Essential (primary) hypertension: Secondary | ICD-10-CM | POA: Diagnosis not present

## 2021-08-11 DIAGNOSIS — Z6829 Body mass index (BMI) 29.0-29.9, adult: Secondary | ICD-10-CM

## 2021-08-11 DIAGNOSIS — R7309 Other abnormal glucose: Secondary | ICD-10-CM

## 2021-08-11 DIAGNOSIS — E782 Mixed hyperlipidemia: Secondary | ICD-10-CM

## 2021-08-11 DIAGNOSIS — E663 Overweight: Secondary | ICD-10-CM

## 2021-08-12 LAB — CBC WITH DIFFERENTIAL/PLATELET
Absolute Monocytes: 373 cells/uL (ref 200–950)
Basophils Absolute: 38 cells/uL (ref 0–200)
Basophils Relative: 0.7 %
Eosinophils Absolute: 70 cells/uL (ref 15–500)
Eosinophils Relative: 1.3 %
HCT: 42.2 % (ref 35.0–45.0)
Hemoglobin: 14.8 g/dL (ref 11.7–15.5)
Lymphs Abs: 1960 cells/uL (ref 850–3900)
MCH: 32.7 pg (ref 27.0–33.0)
MCHC: 35.1 g/dL (ref 32.0–36.0)
MCV: 93.4 fL (ref 80.0–100.0)
MPV: 10.6 fL (ref 7.5–12.5)
Monocytes Relative: 6.9 %
Neutro Abs: 2959 cells/uL (ref 1500–7800)
Neutrophils Relative %: 54.8 %
Platelets: 229 10*3/uL (ref 140–400)
RBC: 4.52 10*6/uL (ref 3.80–5.10)
RDW: 12 % (ref 11.0–15.0)
Total Lymphocyte: 36.3 %
WBC: 5.4 10*3/uL (ref 3.8–10.8)

## 2021-08-12 LAB — LIPID PANEL
Cholesterol: 166 mg/dL (ref <200)
HDL: 60 mg/dL (ref >=50)
LDL Cholesterol (Calc): 75 mg/dL (calc)
Non-HDL Cholesterol (Calc): 106 mg/dL (calc) (ref <130)
Total CHOL/HDL Ratio: 2.8 (calc) (ref <5.0)
Triglycerides: 221 mg/dL — ABNORMAL HIGH (ref <150)

## 2021-08-12 LAB — COMPLETE METABOLIC PANEL WITH GFR
AG Ratio: 2 (calc) (ref 1.0–2.5)
ALT: 31 U/L — ABNORMAL HIGH (ref 6–29)
AST: 21 U/L (ref 10–35)
Albumin: 4.4 g/dL (ref 3.6–5.1)
Alkaline phosphatase (APISO): 144 U/L (ref 37–153)
BUN: 18 mg/dL (ref 7–25)
CO2: 28 mmol/L (ref 20–32)
Calcium: 9.5 mg/dL (ref 8.6–10.4)
Chloride: 104 mmol/L (ref 98–110)
Creat: 0.63 mg/dL (ref 0.50–1.05)
Globulin: 2.2 g/dL (calc) (ref 1.9–3.7)
Glucose, Bld: 87 mg/dL (ref 65–99)
Potassium: 3.8 mmol/L (ref 3.5–5.3)
Sodium: 140 mmol/L (ref 135–146)
Total Bilirubin: 0.2 mg/dL (ref 0.2–1.2)
Total Protein: 6.6 g/dL (ref 6.1–8.1)
eGFR: 97 mL/min/{1.73_m2} (ref >=60)

## 2021-08-12 LAB — TSH: TSH: 2.62 mIU/L (ref 0.40–4.50)

## 2021-09-05 ENCOUNTER — Other Ambulatory Visit: Payer: Self-pay | Admitting: Internal Medicine

## 2021-09-05 DIAGNOSIS — E6609 Other obesity due to excess calories: Secondary | ICD-10-CM

## 2021-10-13 ENCOUNTER — Encounter: Payer: Self-pay | Admitting: Internal Medicine

## 2021-10-13 ENCOUNTER — Other Ambulatory Visit: Payer: Self-pay | Admitting: Internal Medicine

## 2021-10-13 ENCOUNTER — Ambulatory Visit (INDEPENDENT_AMBULATORY_CARE_PROVIDER_SITE_OTHER): Payer: Medicare Other | Admitting: Internal Medicine

## 2021-10-13 VITALS — BP 140/80 | HR 88 | Temp 97.9°F | Resp 16 | Ht 66.0 in | Wt 178.6 lb

## 2021-10-13 DIAGNOSIS — E663 Overweight: Secondary | ICD-10-CM | POA: Diagnosis not present

## 2021-10-13 DIAGNOSIS — D485 Neoplasm of uncertain behavior of skin: Secondary | ICD-10-CM

## 2021-10-13 DIAGNOSIS — Z6829 Body mass index (BMI) 29.0-29.9, adult: Secondary | ICD-10-CM | POA: Diagnosis not present

## 2021-10-13 DIAGNOSIS — C44729 Squamous cell carcinoma of skin of left lower limb, including hip: Secondary | ICD-10-CM | POA: Diagnosis not present

## 2021-10-13 DIAGNOSIS — Z79899 Other long term (current) drug therapy: Secondary | ICD-10-CM | POA: Diagnosis not present

## 2021-10-13 NOTE — Progress Notes (Signed)
Future Appointments  Date Time Provider Department  10/13/2021  3:30 PM Ruth Pinto, MD [facility]  11/16/2021                3 month  10:30 AM Ruth Rossetti, NP [facility]  03/03/2022                 cpe 10:00 AM Ruth Rossetti, NP [facility]  05/05/2022 11:00 AM Ruth Rossetti, NP [facility]    History of Present Illness:     Patient presents to discuss her weight loss meds ( Phentermine & Topiramate) . He is considering going to a weight loss clinic for [facility] / shots at $50/ month. She also has concerns re: a skin lesion on her Left posterior mid calf.   Medications   Current Outpatient Medications (Cardiovascular):    atenolol (TENORMIN) 25 MG tablet, Take 1 tablet by mouth once daily for blood pressure   hydrochlorothiazide (HYDRODIURIL) 25 MG tablet, TAKE 1 TABLET BY MOUTH ONCE DAILY FOR BLOOD PRESSURE &  FLUID  RETENTION/ANKLE  SWELLING   rosuvastatin (CRESTOR) 20 MG tablet, TAKE 1 TABLET BY MOUTH ONCE DAILY FOR CHOLESTEROL   Current Outpatient Medications (Analgesics):    aspirin 81 MG tablet, Take 81 mg by mouth daily.   Current Outpatient Medications (Other):    Ascorbic Acid (VITAMIN C) 1000 MG tablet, Take 1,000 mg by mouth daily.   Cholecalciferol (VITAMIN D3) 125 MCG (5000 UT) TABS, Take      1 capsule       Daily   Docusate Calcium (STOOL SOFTENER PO), Take by mouth.   MAGNESIUM PO, Take by mouth.   Multiple Vitamin (MULTIVITAMIN WITH MINERALS) TABS tablet, Take 1 tablet by mouth daily.   Omega-3 Fatty Acids (FISH OIL) 1000 MG CAPS, Take by mouth.   phentermine (ADIPEX-P) 37.5 MG tablet, TAKE 1/2 TO 1  TABLET  DAILY IN THE MORNING FOR DIETING AND WEIGHT LOSS   sertraline (ZOLOFT) 100 MG tablet, Take  1 tablet  Daily for Mood                            /               Take 1 tablet        by mouth       once daily   topiramate (TOPAMAX) 50 MG tablet, TAKE 1/2 TO 1 (ONE-HALF TO ONE) TABLET BY MOUTH TWICE DAILY AT SUPPERTIME AND BEDTIME FOR DIETING AND  WEIGHTLOSS   Wheat Dextrin (BENEFIBER PO), Take by mouth.   Zinc 50 MG TABS, Take by mouth daily.  Problem list She has Hypertension; Hyperlipidemia; Vitamin D deficiency; Medication management; Depression, major, recurrent, in complete remission ([place]); Fatty liver; History of adenomatous polyp of colon; Osteopenia; and COVID on their problem list.   Observations/Objective:  BP (!) 140/80   Pulse 88   Temp 97.9 F (36.6 C)   Resp 16   Ht '5\' 6"'$  (1.676 m)   Wt 178 lb 9.6 oz (81 kg)   SpO2 98%   BMI 28.83 kg/m  Skin focused exam finds a 10 mm firm raised lesion of the left posterior mid calf.   Procedure  ( CPT:  41324 )      After informed consent and aseptic prep with alcohol the above lesion was anesthetized with 2 ml of Marcaine 0.5% Subcut. Then with a # 10 scalpel,  a vertically  transverse full thickness  skin block was excised to completely remove the skin lesion.  Then the skin edges were approximated, aligned & everted with  # 9 sutures of Nylon 3-0.  Antibiotic ung, non stick dressing was covered with a 4" x 6 "  Tegaderm dressing.  Patient was instructed in post -op wound care.   Assessment and Plan:  1. Overweight    2. Neoplasm of uncertain behavior of skin, Left calf - Excised.   - ROV 12-14 days for suture removal   Follow Up Instructions:        I discussed the assessment and treatment plan with the patient. The patient was provided an opportunity to ask questions and all were answered. The patient agreed with the plan and demonstrated an understanding of the instructions.       The patient was advised to call back or seek an in-person evaluation if the symptoms worsen or if the condition fails to improve as anticipated.    Kirtland Bouchard, MD

## 2021-10-16 ENCOUNTER — Encounter: Payer: Self-pay | Admitting: Nurse Practitioner

## 2021-10-16 ENCOUNTER — Other Ambulatory Visit: Payer: Self-pay | Admitting: Nurse Practitioner

## 2021-10-16 DIAGNOSIS — C44729 Squamous cell carcinoma of skin of left lower limb, including hip: Secondary | ICD-10-CM

## 2021-10-24 ENCOUNTER — Other Ambulatory Visit: Payer: Self-pay | Admitting: Nurse Practitioner

## 2021-10-24 NOTE — Progress Notes (Unsigned)
Future Appointments  Date Time Provider Department  10/26/2021 11:30 AM Unk Pinto, MD [facility]  11/16/2021 10:30 AM Alycia Rossetti, NP [facility]  03/03/2022 10:00 AM Alycia Rossetti, NP [facility]  05/05/2022 11:00 AM Alycia Rossetti, NP [facility]    History of Present Illness:  Patient returns 13 day s/p excision of a SCC of her posterior Left calf on 8/29 for suture removal.  Medications   Current Outpatient Medications (Cardiovascular):    atenolol (TENORMIN) 25 MG tablet, Take 1 tablet  Daily  for BP                                                     /                                  Take                                        by                            mouth                     once daily   hydrochlorothiazide (HYDRODIURIL) 25 MG tablet, Take  1 tablet  Daily for BP & Fluid Retention / Ankle Swelling                                    /                                 TAKE                                  BY                           MOUTH                             ONCE DAILY   rosuvastatin (CRESTOR) 20 MG tablet, TAKE 1 TABLET BY MOUTH ONCE DAILY FOR CHOLESTEROL   Current Outpatient Medications (Analgesics):    aspirin 81 MG tablet, Take 81 mg by mouth daily.   Current Outpatient Medications (Other):    Ascorbic Acid (VITAMIN C) 1000 MG tablet, Take 1,000 mg by mouth daily.   Cholecalciferol (VITAMIN D3) 125 MCG (5000 UT) TABS, Take      1 capsule       Daily   Docusate Calcium (STOOL SOFTENER PO), Take by mouth.   MAGNESIUM PO, Take by mouth.   Multiple Vitamin (MULTIVITAMIN WITH MINERALS) TABS tablet, Take 1 tablet by mouth daily.   Omega-3 Fatty Acids (FISH OIL) 1000 MG CAPS, Take by mouth.   phentermine (ADIPEX-P) 37.5 MG tablet, TAKE 1/2 TO 1  TABLET  DAILY IN THE MORNING FOR DIETING  AND WEIGHT LOSS   sertraline (ZOLOFT) 100 MG tablet, Take  1 tablet  Daily for Mood                            /               Take 1 tablet        by mouth        once daily   topiramate (TOPAMAX) 50 MG tablet, TAKE 1/2 TO 1 (ONE-HALF TO ONE) TABLET BY MOUTH TWICE DAILY AT SUPPERTIME AND BEDTIME FOR DIETING AND WEIGHTLOSS   Wheat Dextrin (BENEFIBER PO), Take by mouth.   Zinc 50 MG TABS, Take by mouth daily.  Problem list She has Hypertension; Hyperlipidemia; Vitamin D deficiency; Medication management; Depression, major, recurrent, in complete remission ([place]); Fatty liver; History of adenomatous polyp of colon; Osteopenia; and COVID on their problem list.   Observations/Objective:  BP 138/80   Pulse 68   Temp 98.2 F (36.8 C)   Resp 17   Ht '5\' 6"'$  (1.676 m)   Wt 177 lb 9.6 oz (80.6 kg)   SpO2 98%   BMI 28.67 kg/m   Wound Lt posterior mid calf well healed w/o signs of Infection. # 10 sutures removed w/o difficulty & Abx Ung applied & covered with  a sterile bandage.  Assessment and Plan:   1. 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    Follow Up Instructions:        I discussed the assessment and treatment plan with the patient. The patient was provided an opportunity to ask questions and all were answered. The patient agreed with the plan and demonstrated an understanding of the instructions.    Kirtland Bouchard, MD

## 2021-10-25 ENCOUNTER — Other Ambulatory Visit: Payer: Self-pay | Admitting: Nurse Practitioner

## 2021-10-25 DIAGNOSIS — I10 Essential (primary) hypertension: Secondary | ICD-10-CM

## 2021-10-25 DIAGNOSIS — R001 Bradycardia, unspecified: Secondary | ICD-10-CM

## 2021-10-26 ENCOUNTER — Ambulatory Visit: Payer: Medicare Other | Admitting: Internal Medicine

## 2021-10-26 ENCOUNTER — Encounter: Payer: Self-pay | Admitting: Internal Medicine

## 2021-10-26 VITALS — BP 138/80 | HR 68 | Temp 98.2°F | Resp 17 | Ht 66.0 in | Wt 177.6 lb

## 2021-10-26 DIAGNOSIS — C44729 Squamous cell carcinoma of skin of left lower limb, including hip: Secondary | ICD-10-CM

## 2021-10-28 DIAGNOSIS — C44729 Squamous cell carcinoma of skin of left lower limb, including hip: Secondary | ICD-10-CM | POA: Diagnosis not present

## 2021-11-12 NOTE — Progress Notes (Signed)
FOLLOW UP  Assessment and Plan:   Hypertension Well controlled with current medications  Monitor blood pressure at home; patient to call if consistently greater than 130/80 Continue DASH diet.   Reminder to go to the ER if any CP, SOB, nausea, dizziness, severe HA, changes vision/speech, left arm numbness and tingling and jaw pain. - CBC  Cholesterol Currently at goal; continue pravastatin Continue low cholesterol diet and exercise.  Check lipid panel, CMP, TSH.   Other abnormal glucose Recent A1Cs at goal Discussed diet/exercise, weight management  Weight gain, losing insurance, wants to check A1C   Overweight Long discussion about weight loss, diet, and exercise Recommended diet heavy in fruits and veggies and low in animal meats, cheeses, and dairy products, appropriate calorie intake Patient will work on starting exercise, watching portions Continue Topamax and Phentermine Will follow up in 3 months  Vitamin D Def At goal last visit; continue supplementation to maintain goal of 70-100 Defer Vit D level  Depression Well controlled currently without medication   Fatty Liver Limit simple carbs, saturated fats, Tylenol and alcohol.  Work on diet, exercise and weight loss - CMP  Medication Management Continued  Squamous cell carcinoma left calf Keep appt with skin surgery center 11/27/21 for removal  Continue diet and meds as discussed. Further disposition pending results of labs. Discussed med's effects and SE's.   Over 30 minutes of exam, counseling, chart review, and critical decision making was performed.   Future Appointments  Date Time Provider [place]  03/03/2022 10:00 AM Alycia Rossetti, NP [facility] None  05/05/2022 11:00 AM Alycia Rossetti, NP [facility] None    ----------------------------------------------------------------------------------------------------------------------  HPI 66 y.o. female  presents for 3 month follow up on  hypertension, cholesterol, glucose manageement, weight, depression/anxiety and vitamin D deficiency.   she has a diagnosis of depression/anxiety and is currently on zoloft 100 mg daily, reports symptoms are well controlled on current regimen.   Left calf she has 3 cm raised red lesion , it is painful.  Has appointment at skin surgery center on 11/27/21. Was initially removed by Dr. Melford Aase on 10/13/21 and was squamous cell carcinoma. It had gone flat but has since increased in size and erythema.   BMI is Body mass index is 29.28 kg/m.,She is more active, doing more yard work.  Drinking more water.  Limiting saturated fats and simple carbs. She started on semaglutide compound 3 weeks ago through a clinic and is on no Phentermine / Topiramate currently since starting the semaglutide. She qould like to restart the Phentermine.  She has been having some constipation and is using benefiber Wt Readings from Last 3 Encounters:  11/16/21 181 lb 6.4 oz (82.3 kg)  10/26/21 177 lb 9.6 oz (80.6 kg)  10/13/21 178 lb 9.6 oz (81 kg)   Her blood pressure has been controlled at home, today their BP is BP: 138/80  BP Readings from Last 3 Encounters:  11/16/21 138/80  10/26/21 138/80  10/13/21 (!) 140/80   She does not workout. She denies chest pain, shortness of breath, dizziness.   She is on cholesterol medication Rosuvastatin 20 mg and denies myalgias. Her LDL cholesterol is at goal. The cholesterol last visit was:   Lab Results  Component Value Date   CHOL 166 08/11/2021   HDL 60 08/11/2021   LDLCALC 75 08/11/2021   TRIG 221 (H) 08/11/2021   CHOLHDL 2.8 08/11/2021    She has not been working on diet and exercise for glucose management, and  denies increased appetite, nausea, paresthesia of the feet, polydipsia, polyuria and visual disturbances. Last A1C in the office was:  Lab Results  Component Value Date   HGBA1C 5.6 02/26/2021   Patient is on Vitamin D supplement.   Lab Results  Component Value  Date   VD25OH 65 02/26/2021       Current Medications:  Current Outpatient Medications on File Prior to Visit  Medication Sig   Ascorbic Acid (VITAMIN C) 1000 MG tablet Take 1,000 mg by mouth daily.   aspirin 81 MG tablet Take 81 mg by mouth daily.   atenolol (TENORMIN) 25 MG tablet Take 1 tablet  Daily  for BP                                                     /                                  Take                                        by                            mouth                     once daily   Cholecalciferol (VITAMIN D3) 125 MCG (5000 UT) TABS Take      1 capsule       Daily   Docusate Calcium (STOOL SOFTENER PO) Take by mouth.   hydrochlorothiazide (HYDRODIURIL) 25 MG tablet Take  1 tablet  Daily for BP & Fluid Retention / Ankle Swelling                                    /                                 TAKE                                  BY                           MOUTH                             ONCE DAILY   MAGNESIUM PO Take by mouth.   Multiple Vitamin (MULTIVITAMIN WITH MINERALS) TABS tablet Take 1 tablet by mouth daily.   Omega-3 Fatty Acids (FISH OIL) 1000 MG CAPS Take by mouth.   rosuvastatin (CRESTOR) 20 MG tablet TAKE 1 TABLET BY MOUTH ONCE DAILY FOR CHOLESTEROL   sertraline (ZOLOFT) 100 MG tablet Take  1 tablet  Daily for Mood                            /  Take 1 tablet        by mouth       once daily   Wheat Dextrin (BENEFIBER PO) Take by mouth.   Zinc 50 MG TABS Take by mouth daily.   topiramate (TOPAMAX) 50 MG tablet TAKE 1/2 TO 1 (ONE-HALF TO ONE) TABLET BY MOUTH TWICE DAILY AT SUPPERTIME AND BEDTIME FOR DIETING AND WEIGHTLOSS (Patient not taking: Reported on 11/16/2021)   No current facility-administered medications on file prior to visit.     Allergies:  Allergies  Allergen Reactions   Atorvastatin Nausea Only     Medical History:  Past Medical History:  Diagnosis Date   Allergy    Anxiety    Climacteric    Depression     Fibrocystic breast    Hypercholesteremia    Hyperlipidemia    Hypertension    Thumb laceration, right, initial encounter    Vitamin D deficiency    Family history- Reviewed and unchanged Social history- Reviewed and unchanged   Review of Systems:  Review of Systems  Constitutional:  Negative for malaise/fatigue and weight loss.  HENT:  Negative for hearing loss and tinnitus.   Eyes:  Negative for blurred vision and double vision.  Respiratory:  Negative for cough, shortness of breath and wheezing.   Cardiovascular:  Negative for chest pain, palpitations, orthopnea, claudication and leg swelling.  Gastrointestinal:  Negative for abdominal pain, blood in stool, constipation, diarrhea, heartburn, melena, nausea and vomiting.  Genitourinary: Negative.   Musculoskeletal:  Negative for joint pain and myalgias.  Skin:  Negative for rash.       Lesion on left calf  Neurological:  Negative for dizziness, tingling, sensory change, weakness and headaches.  Endo/Heme/Allergies:  Negative for polydipsia.  Psychiatric/Behavioral: Negative.    All other systems reviewed and are negative.     Physical Exam: BP 138/80   Pulse 64   Temp 98 F (36.7 C)   Resp 16   Ht '5\' 6"'$  (1.676 m)   Wt 181 lb 6.4 oz (82.3 kg)   SpO2 98%   BMI 29.28 kg/m  Wt Readings from Last 3 Encounters:  11/16/21 181 lb 6.4 oz (82.3 kg)  10/26/21 177 lb 9.6 oz (80.6 kg)  10/13/21 178 lb 9.6 oz (81 kg)   General Appearance: Well nourished, in no apparent distress. Eyes: PERRLA, EOMs, conjunctiva no swelling or erythema Sinuses: No Frontal/maxillary tenderness ENT/Mouth: Ext aud canals clear, TMs without erythema, bulging. No erythema, swelling, or exudate on post pharynx.  Tonsils not swollen or erythematous. Hearing normal.  Neck: Supple, thyroid normal.  Respiratory: Respiratory effort normal, BS equal bilaterally without rales, rhonchi, wheezing or stridor.  Cardio: RRR with no MRGs. Brisk peripheral pulses  without edema.  Abdomen: Soft, + BS.  Non tender, no guarding, rebound, hernias, masses. Lymphatics: Non tender without lymphadenopathy.  Musculoskeletal: Full ROM, 5/5 strength, Normal gait Skin: Warm, dry.Left calf she has 3 cm raised red lesion with scaly border Neuro: Cranial nerves intact. No cerebellar symptoms.  Psych: Awake and oriented X 3, normal affect, Insight and Judgment appropriate.    Alycia Rossetti, NP 10:48 AM [facility]

## 2021-11-16 ENCOUNTER — Ambulatory Visit (INDEPENDENT_AMBULATORY_CARE_PROVIDER_SITE_OTHER): Payer: Medicare Other | Admitting: Nurse Practitioner

## 2021-11-16 ENCOUNTER — Encounter: Payer: Self-pay | Admitting: Nurse Practitioner

## 2021-11-16 VITALS — BP 138/80 | HR 64 | Temp 98.0°F | Resp 16 | Ht 66.0 in | Wt 181.4 lb

## 2021-11-16 DIAGNOSIS — E559 Vitamin D deficiency, unspecified: Secondary | ICD-10-CM

## 2021-11-16 DIAGNOSIS — R7309 Other abnormal glucose: Secondary | ICD-10-CM

## 2021-11-16 DIAGNOSIS — C44729 Squamous cell carcinoma of skin of left lower limb, including hip: Secondary | ICD-10-CM

## 2021-11-16 DIAGNOSIS — Z6829 Body mass index (BMI) 29.0-29.9, adult: Secondary | ICD-10-CM

## 2021-11-16 DIAGNOSIS — E782 Mixed hyperlipidemia: Secondary | ICD-10-CM

## 2021-11-16 DIAGNOSIS — E663 Overweight: Secondary | ICD-10-CM

## 2021-11-16 DIAGNOSIS — K760 Fatty (change of) liver, not elsewhere classified: Secondary | ICD-10-CM

## 2021-11-16 DIAGNOSIS — F3342 Major depressive disorder, recurrent, in full remission: Secondary | ICD-10-CM

## 2021-11-16 DIAGNOSIS — I10 Essential (primary) hypertension: Secondary | ICD-10-CM

## 2021-11-16 DIAGNOSIS — Z79899 Other long term (current) drug therapy: Secondary | ICD-10-CM | POA: Diagnosis not present

## 2021-11-16 MED ORDER — PHENTERMINE HCL 37.5 MG PO TABS: ORAL_TABLET | ORAL | 1 refills | Status: DC

## 2021-11-17 LAB — CBC WITH DIFFERENTIAL/PLATELET
Absolute Monocytes: 248 cells/uL (ref 200–950)
Basophils Absolute: 38 cells/uL (ref 0–200)
Basophils Relative: 0.7 %
Eosinophils Absolute: 97 cells/uL (ref 15–500)
Eosinophils Relative: 1.8 %
HCT: 42.7 % (ref 35.0–45.0)
Hemoglobin: 14.9 g/dL (ref 11.7–15.5)
Lymphs Abs: 1809 cells/uL (ref 850–3900)
MCH: 33.2 pg — ABNORMAL HIGH (ref 27.0–33.0)
MCHC: 34.9 g/dL (ref 32.0–36.0)
MCV: 95.1 fL (ref 80.0–100.0)
MPV: 10.2 fL (ref 7.5–12.5)
Monocytes Relative: 4.6 %
Neutro Abs: 3208 cells/uL (ref 1500–7800)
Neutrophils Relative %: 59.4 %
Platelets: 230 10*3/uL (ref 140–400)
RBC: 4.49 10*6/uL (ref 3.80–5.10)
RDW: 11.8 % (ref 11.0–15.0)
Total Lymphocyte: 33.5 %
WBC: 5.4 10*3/uL (ref 3.8–10.8)

## 2021-11-17 LAB — LIPID PANEL
Cholesterol: 138 mg/dL (ref <200)
HDL: 69 mg/dL (ref >=50)
LDL Cholesterol (Calc): 45 mg/dL (calc)
Non-HDL Cholesterol (Calc): 69 mg/dL (calc) (ref <130)
Total CHOL/HDL Ratio: 2 (calc) (ref <5.0)
Triglycerides: 161 mg/dL — ABNORMAL HIGH (ref <150)

## 2021-11-17 LAB — COMPLETE METABOLIC PANEL WITH GFR
AG Ratio: 1.8 (calc) (ref 1.0–2.5)
ALT: 34 U/L — ABNORMAL HIGH (ref 6–29)
AST: 24 U/L (ref 10–35)
Albumin: 4.3 g/dL (ref 3.6–5.1)
Alkaline phosphatase (APISO): 153 U/L (ref 37–153)
BUN: 17 mg/dL (ref 7–25)
CO2: 26 mmol/L (ref 20–32)
Calcium: 9.3 mg/dL (ref 8.6–10.4)
Chloride: 101 mmol/L (ref 98–110)
Creat: 0.56 mg/dL (ref 0.50–1.05)
Globulin: 2.4 g/dL (calc) (ref 1.9–3.7)
Glucose, Bld: 119 mg/dL — ABNORMAL HIGH (ref 65–99)
Potassium: 4.5 mmol/L (ref 3.5–5.3)
Sodium: 138 mmol/L (ref 135–146)
Total Bilirubin: 0.3 mg/dL (ref 0.2–1.2)
Total Protein: 6.7 g/dL (ref 6.1–8.1)
eGFR: 100 mL/min/{1.73_m2} (ref >=60)

## 2021-11-17 LAB — HEMOGLOBIN A1C
Hgb A1c MFr Bld: 5.4 % of total Hgb (ref <5.7)
Mean Plasma Glucose: 108 mg/dL
eAG (mmol/L): 6 mmol/L

## 2021-11-18 DIAGNOSIS — C44729 Squamous cell carcinoma of skin of left lower limb, including hip: Secondary | ICD-10-CM | POA: Diagnosis not present

## 2021-11-23 ENCOUNTER — Other Ambulatory Visit: Payer: Self-pay | Admitting: Nurse Practitioner

## 2021-12-07 ENCOUNTER — Encounter: Payer: Self-pay | Admitting: Internal Medicine

## 2021-12-07 ENCOUNTER — Ambulatory Visit (INDEPENDENT_AMBULATORY_CARE_PROVIDER_SITE_OTHER): Payer: Medicare Other | Admitting: Internal Medicine

## 2021-12-07 VITALS — BP 132/82 | HR 79 | Temp 97.9°F | Resp 16 | Ht 66.0 in | Wt 181.2 lb

## 2021-12-07 DIAGNOSIS — B0222 Postherpetic trigeminal neuralgia: Secondary | ICD-10-CM | POA: Diagnosis not present

## 2021-12-07 MED ORDER — GABAPENTIN 300 MG PO CAPS: ORAL_CAPSULE | ORAL | 0 refills | Status: DC

## 2021-12-07 MED ORDER — DEXAMETHASONE 4 MG PO TABS: ORAL_TABLET | ORAL | 0 refills | Status: DC

## 2021-12-07 MED ORDER — VALACYCLOVIR HCL 1 G PO TABS: ORAL_TABLET | ORAL | 0 refills | Status: DC

## 2021-12-07 NOTE — Progress Notes (Signed)
    Future Appointments  Date Time Provider Department  03/03/2022 10:00 AM Alycia Rossetti, NP [facility]  05/05/2022 11:00 AM Alycia Rossetti, NP [facility]    History of Present Illness:     Patient c/o a painful pruritic rash of the of the Rt forehead & temple area.   Medications     atenolol  25 MG tablet, Take 1 tablet  Daily   hydrochlorothiazide 25 MG tablet, Take  1 tablet  Daily    rosuvastatin  20 MG tablet, TAKE 1 TABLET DAILY    aspirin 81 MG tablet, Take 81 mg by mouth daily.   VITAMIN C 1000 MG tablet, Take daily.   VITAMIN D 5000 u, Take 1 capsule Daily   Docusate Calcium , Take daily   MAGNESIUM PO, Take  daily   Multiple Vitamin , Take 1 tablet daily.   Omega-3 FISH OIL 1000 MG CAPS, Take daily   phentermine 37.5 MG tablet, TAKE 1/2 TO 1  TABLET  DAILY    sertraline 100 MG tablet, Take  1 tablet  Daily    BENEFIBER, Take daily   Zinc 50 MG TABS, Take daily.  Problem list She has Hypertension; Hyperlipidemia; Vitamin D deficiency; Medication management; Depression, major, recurrent, in complete remission ([place]); Fatty liver; History of adenomatous polyp of colon; Osteopenia; and COVID on their problem list.   Observations/Objective:  BP 132/82   Pulse 79   Temp 97.9 F (36.6 C)   Resp 16   Ht '5\' 6"'$  (1.676 m)   Wt 181 lb 3.2 oz (82.2 kg)   SpO2 96%   BMI 29.25 kg/m   Skin focused exam finds a crusting rash of the N1 or superior branch of the Rt Trigeminal nerve. No vessicles noted. Orbit & TM not involved.   Assessment and Plan:   1. Trigeminal herpes zoster  - valACYclovir 1000 MG tablet Take 1 tablet 3 x day for Shingles  \ Dispense: 30 tablet  - dexamethasone  4 MG tablet;  Take 1 tab 3 x day - 3 days, then 2 x day - 3 days, then 1 tab daily   Dispense: 20 tablet  Gabapentin 300 mg   #270 Take 1 tablet bedtime or 3 x /day as needed for pain                                                         Follow Up Instructions:        I  discussed the assessment and treatment plan with the patient. The patient was provided an opportunity to ask questions and all were answered. The patient agreed with the plan and demonstrated an understanding of the instructions.       The patient was advised to call back or seek an in-person evaluation if the symptoms worsen or if the condition fails to improve as anticipated.   Kirtland Bouchard, MD

## 2021-12-07 NOTE — Patient Instructions (Signed)
Shingles  Shingles, which is also known as herpes zoster, is an infection that causes a painful skin rash and fluid-filled blisters. It is caused by a virus. Shingles only develops in people who: Have had chickenpox. Have been vaccinated against chickenpox. Shingles is rare in this group. What are the causes? Shingles is caused by varicella-zoster virus. This is the same virus that causes chickenpox. After a person is exposed to the virus, it stays in the body in an inactive (dormant) state. Shingles develops if the virus is reactivated. This can happen many years after the first (initial) exposure to the virus. It is not known what causes this virus to be reactivated. What increases the risk? People who have had chickenpox or received the chickenpox vaccine are at risk for shingles. Shingles infection is more common in people who: Are older than 68 years of age. Have a weakened disease-fighting system (immune system), such as people with: HIV (human immunodeficiency virus). AIDS (acquired immunodeficiency syndrome). Cancer. Are taking medicines that weaken the immune system, such as organ transplant medicines. Are experiencing a lot of stress. What are the signs or symptoms? Early symptoms of this condition include itching, tingling, and pain in an area on your skin. Pain may be described as burning, stabbing, or throbbing. A few days or weeks after early symptoms start, a painful red rash appears. The rash is usually on one side of the body and has a band-like or belt-like pattern. The rash eventually turns into fluid-filled blisters that break open, change into scabs, and dry up in about 2-3 weeks. At any time during the infection, you may also develop: A fever. Chills. A headache. Nausea. How is this diagnosed? This condition is diagnosed with a skin exam. Skin or fluid samples (a culture) may be taken from the blisters before a diagnosis is made. How is this treated? The rash may  last for several weeks. There is not a specific cure for this condition. Your health care provider may prescribe medicines to help you manage pain, recover more quickly, and avoid long-term problems. Medicines may include: Antiviral medicines. Anti-inflammatory medicines. Pain medicines. Anti-itching medicines (antihistamines). If the area involved is on your face, you may be referred to a specialist, such as an eye doctor (ophthalmologist) or an ear, nose, and throat (ENT) doctor (otorhinolaryngologist) to help you avoid eye problems, chronic pain, or disability. Follow these instructions at home: Medicines Take over-the-counter and prescription medicines only as told by your health care provider. Apply an anti-itch cream or numbing cream to the affected area as told by your health care provider. Relieving itching and discomfort  Apply cold, wet cloths (cold compresses) to the area of the rash or blisters as told by your health care provider. Cool baths can be soothing. Try adding baking soda or dry oatmeal to the water to reduce itching. Do not bathe in hot water. Use calamine lotion as recommended by your health care provider. This is an over-the-counter lotion that helps to relieve itchiness. Blister and rash care Keep your rash covered with a loose bandage (dressing). Wear loose-fitting clothing to help ease the pain of material rubbing against the rash. Wash your hands with soap and water for at least 20 seconds before and after you change your dressing. If soap and water are not available, use hand sanitizer. Change your dressing as told by your health care provider. Keep your rash and blisters clean by washing the area with mild soap and cool water as told by your   health care provider. Check your rash every day for signs of infection. Check for: More redness, swelling, or pain. Fluid or blood. Warmth. Pus or a bad smell. Do not scratch your rash or pick at your blisters. To help  avoid scratching: Keep your fingernails clean and cut short. Wear gloves or mittens while you sleep, if scratching is a problem. General instructions Rest as told by your health care provider. Wash your hands often with soap and water for at least 20 seconds. If soap and water are not available, use hand sanitizer. Doing this lowers your chance of getting a bacterial skin infection. Before your blisters change into scabs, your shingles infection can cause chickenpox in people who have never had it or have never been vaccinated against it. To prevent this from happening, avoid contact with other people, especially: Babies. Pregnant women. Children who have eczema. Older people who have transplants. People who have chronic illnesses, such as cancer or AIDS. Keep all follow-up visits. This is important. How is this prevented? Getting vaccinated is the best way to prevent shingles and protect against shingles complications. If you have not been vaccinated, talk with your health care provider about getting the vaccine. Where to find more information Centers for Disease Control and Prevention: http://www.wolf.info/ Contact a health care provider if: Your pain is not relieved with prescribed medicines. Your pain does not get better after the rash heals. You have any of these signs of infection: More redness, swelling, or pain around the rash. Fluid or blood coming from the rash. Warmth coming from your rash. Pus or a bad smell coming from the rash. A fever. Get help right away if: The rash is on your face or nose. You have facial pain, pain around your eye area, or loss of feeling on one side of your face. You have difficulty seeing. You have ear pain or have ringing in your ear. You have a loss of taste. Your condition gets worse. Summary Shingles, also known as herpes zoster, is an infection that causes a painful skin rash and fluid-filled blisters. This condition is diagnosed with a skin exam.  Skin or fluid samples (a culture) may be taken from the blisters. Keep your rash covered with a loose bandage (dressing). Wear loose-fitting clothing to help ease the pain of material rubbing against the rash. Before your blisters change into scabs, your shingles infection can cause chickenpox in people who have never had it or have never been vaccinated against it. ========================== Postherpetic Neuralgia  Postherpetic neuralgia (PHN) is nerve pain that occurs after a shingles infection. Shingles is a painful rash that appears on one area of the body, usually on the trunk or face. Shingles is caused by the varicella-zoster virus. This is the same virus that causes chickenpox. In people who have had chickenpox, the virus can resurface years later and cause shingles. PHN appears in the same area where you had the shingles rash. The pain usually goes away after the rash disappears. You may have PHN if you continue to have pain 3 months after your shingles rash has gone away. What are the causes? This condition is caused by damage to your nerves due to inflammation from the varicella-zoster virus. The damage makes your nerves overly sensitive. What increases the risk? The following factors may make you more likely to develop this condition: Being older than 68 years of age. Having severe pain before your shingles rash starts. Having a severe rash. Having shingles in and around the eye  area. Having a disease or taking medicine that causes you to have a weakened disease-fighting system (immune system). What are the signs or symptoms? The main symptom of this condition is pain. The pain may: Often be severe and may be described as stabbing, burning, shooting, or feeling like an electric shock. Come and go, or it may be there all the time. Be triggered by light touches on the skin or changes in temperature. You may have itching along with the pain. How is this diagnosed? This condition may  be diagnosed based on your symptoms and your history of shingles. Lab studies and other diagnostic tests are usually not needed. How is this treated? There is no cure for this condition. Treatment for PHN will focus on pain relief. Over-the-counter pain relievers do not usually relieve PHN pain. You may need to work with a pain specialist. Treatment may include: Anti-seizure medicines to relieve nerve pain. Antidepressant medicines to help with pain and improve sleep. A numbing patch worn on the skin (lidocaine patch). Strong pain relievers (opioids). Injections of numbing medicine or anti-inflammatory medicines around irritated nerves. Injections of botulinum toxin to block pain signals between nerves and muscles. Follow these instructions at home: Medicines Take over-the-counter and prescription medicines only as told by your health care provider. Ask your health care provider if the medicine prescribed to you: Requires you to avoid driving or using machinery. Can cause constipation. You may need to take these actions to prevent or treat constipation: Drink enough fluid to keep your urine pale yellow. Take over-the-counter or prescription medicines. Eat foods that are high in fiber, such as beans, whole grains, and fresh fruits and vegetables. Limit foods that are high in fat and processed sugars, such as fried or sweet foods. Managing pain  If directed, put ice on the painful area. To do this: Put ice in a plastic bag. Place a towel between your skin and the bag. Leave the ice on for 20 minutes, 2-3 times a day. Remove the ice if your skin turns bright red. This is very important. If you cannot feel pain, heat, or cold, you have a greater risk of damage to the area. Cover sensitive areas with a bandage (dressing) to reduce friction from clothing rubbing on the area. General instructions It may take a long time to recover from PHN. Work closely with your health care provider and develop  a good support system at home. You may consider joining a support group. Wear loose, comfortable clothing. Talk to your health care provider if you feel depressed or desperate. Living with long-term pain can be depressing. Keep all follow-up visits. This is important. How is this prevented? Getting a vaccination for shingles can prevent PHN. The shingles vaccine is recommended for people older than age 86. It may prevent shingles and may also lower your risk of PHN if you do get shingles. Contact a health care provider if: Your medicine is not helping. You are struggling to manage your pain at home. Get help right away if: You have thoughts about hurting yourself or others. If you ever feel like you may hurt yourself or others, or have thoughts about taking your own life, get help right away. Go to your nearest emergency department or: Call your local emergency services (911 in the [place] Call a suicide crisis helpline, such as the [facility] at (539)581-9487 or 988 in the [place]. This is open 24 hours a day in the [place] Text the Crisis Text Line  at 2893026289 (in the [place] Summary Postherpetic neuralgia (PHN) is a very painful disorder that can occur after an episode of shingles. The pain is often severe and may be described as stabbing, burning, shooting, or feeling like an electric shock. Prescription medicines can be helpful in managing persistent pain. Getting a vaccination for shingles can prevent PHN. This vaccine is recommended for people older than age 12. This information is not intended to replace advice given to you by your health care provider. Make sure you discuss any questions you have with your health care provider. Document Revised: 08/27/2020 Document Reviewed: 01/28/2020 Elsevier Patient Education  [place].

## 2021-12-08 DIAGNOSIS — B0239 Other herpes zoster eye disease: Secondary | ICD-10-CM | POA: Diagnosis not present

## 2021-12-11 ENCOUNTER — Encounter: Payer: Self-pay | Admitting: Internal Medicine

## 2021-12-22 DIAGNOSIS — B0239 Other herpes zoster eye disease: Secondary | ICD-10-CM | POA: Diagnosis not present

## 2022-01-11 ENCOUNTER — Other Ambulatory Visit: Payer: Self-pay | Admitting: Internal Medicine

## 2022-01-11 DIAGNOSIS — R001 Bradycardia, unspecified: Secondary | ICD-10-CM

## 2022-01-11 DIAGNOSIS — B0222 Postherpetic trigeminal neuralgia: Secondary | ICD-10-CM

## 2022-01-11 DIAGNOSIS — I10 Essential (primary) hypertension: Secondary | ICD-10-CM

## 2022-01-11 DIAGNOSIS — F3342 Major depressive disorder, recurrent, in full remission: Secondary | ICD-10-CM

## 2022-02-22 ENCOUNTER — Other Ambulatory Visit: Payer: Self-pay | Admitting: Nurse Practitioner

## 2022-02-22 ENCOUNTER — Telehealth: Payer: Self-pay

## 2022-02-22 ENCOUNTER — Ambulatory Visit (INDEPENDENT_AMBULATORY_CARE_PROVIDER_SITE_OTHER): Payer: Medicare Other | Admitting: Nurse Practitioner

## 2022-02-22 ENCOUNTER — Telehealth: Payer: Self-pay | Admitting: Nurse Practitioner

## 2022-02-22 VITALS — BP 112/72 | HR 71 | Temp 97.2°F | Ht 66.0 in | Wt 174.6 lb

## 2022-02-22 DIAGNOSIS — L209 Atopic dermatitis, unspecified: Secondary | ICD-10-CM

## 2022-02-22 DIAGNOSIS — B029 Zoster without complications: Secondary | ICD-10-CM | POA: Diagnosis not present

## 2022-02-22 DIAGNOSIS — B0229 Other postherpetic nervous system involvement: Secondary | ICD-10-CM

## 2022-02-22 DIAGNOSIS — I10 Essential (primary) hypertension: Secondary | ICD-10-CM | POA: Diagnosis not present

## 2022-02-22 MED ORDER — KETOCONAZOLE 2 % EX FOAM: 1.0000 | Freq: Once | CUTANEOUS | 0 refills | Status: DC

## 2022-02-22 MED ORDER — ACYCLOVIR 5 % EX OINT: TOPICAL_OINTMENT | CUTANEOUS | 0 refills | Status: AC

## 2022-02-22 MED ORDER — ROSUVASTATIN CALCIUM 20 MG PO TABS: 20.0000 mg | ORAL_TABLET | Freq: Every day | ORAL | 0 refills | Status: DC

## 2022-02-22 MED ORDER — GABAPENTIN 100 MG PO CAPS: 100.0000 mg | ORAL_CAPSULE | Freq: Three times a day (TID) | ORAL | 0 refills | Status: DC

## 2022-02-22 NOTE — Progress Notes (Signed)
Assessment and Plan:  Ruth Ward was seen today for an episodic visit.  Diagnoses and all order for this visit:  Herpes zoster without complication Try acyclovir ointment over scalp area. Discussed referral to Dermatology if s/s fail to improve.  - acyclovir ointment (ZOVIRAX) 5 %; Apply thin layer to affected area TID PRN  Dispense: 15 g; Refill: 0  PHN (postherpetic neuralgia) Start low dose gabapentin as directed. SE discussed; all questions and concerns reviewed.  - gabapentin (NEURONTIN) 100 MG capsule; Take 1 capsule (100 mg total) by mouth 3 (three) times daily.  Dispense: 90 capsule; Refill: 0  Atopic dermatitis of scalp Start anti-fungal shampoo considering many past failed treatments of topical solutions.  - Ketoconazole 2 % FOAM; Apply 1 Application topically once for 1 dose. If no improvement may repeat once a week.  Dispense: 50 g; Refill: 0  Essential hypertension Discussed DASH (Dietary Approaches to Stop Hypertension) DASH diet is lower in sodium than a typical American diet. Cut back on foods that are high in saturated fat, cholesterol, and trans fats. Eat more whole-grain foods, fish, poultry, and nuts Remain active and exercise as tolerated daily.  Monitor BP at home-Call if greater than 130/80.    Notify office for further evaluation and treatment, questions or concerns if any reported s/s fail to improve.   The patient was advised to call back or seek an in-person evaluation if any symptoms worsen or if the condition fails to improve as anticipated.   Further disposition pending results of labs. Discussed med's effects and SE's.    I discussed the assessment and treatment plan with the patient. The patient was provided an opportunity to ask questions and all were answered. The patient agreed with the plan and demonstrated an understanding of the instructions.  Discussed med's effects and SE's. Screening labs and tests as requested with regular  follow-up as recommended.  I provided 20 minutes of face-to-face time during this encounter including counseling, chart review, and critical decision making was preformed.   Future Appointments  Date Time Provider [place]  03/03/2022 10:00 AM Alycia Rossetti, NP [facility] None  05/05/2022 11:00 AM Alycia Rossetti, NP [facility] None    ------------------------------------------------------------------------------------------------------------------   HPI BP 112/72   Pulse 71   Temp (!) 97.2 F (36.2 C)   Ht '5\' 6"'$  (1.676 m)   Wt 174 lb 9.6 oz (79.2 kg)   SpO2 97%   BMI 28.18 kg/m   Patient presents for evaluation of shingles rash after being diagnosed and treated with Valacyclovir in 11/2021.  Rash continues to involve the scalp and right forehead area. Rash started 3 months ago. Area is flat, dry, erythematous and raised in texture. Rash no longer has vesicles or ulcerations. Rash is painful and is pruritic. Associated symptoms: none. Patient denies: abdominal pain, congestion, cough, decrease in appetite, fever, headache, nausea, and vomiting. She has tried Sulfur 8 shampoo, Vasoline, Coconut oil, Witch [place], [place] and a cold compress, to which she feels works the best.    BP has been well controlled. BP Readings from Last 3 Encounters:  02/22/22 112/72  12/07/21 132/82  11/16/21 138/80    Past Medical History:  Diagnosis Date   Allergy    Anxiety    Climacteric    Depression    Fibrocystic breast    Hypercholesteremia    Hyperlipidemia    Hypertension    Thumb laceration, right, initial encounter    Vitamin D deficiency  Allergies  Allergen Reactions   Atorvastatin Nausea Only    Current Outpatient Medications on File Prior to Visit  Medication Sig   Ascorbic Acid (VITAMIN C) 1000 MG tablet Take 1,000 mg by mouth daily.   aspirin 81 MG tablet Take 81 mg by mouth daily.   atenolol (TENORMIN) 25 MG tablet Take 1 tablet Daily for BP                                                            /                                        Take                                      by                                       mouth                               once daily   Cholecalciferol (VITAMIN D3) 125 MCG (5000 UT) TABS Take      1 capsule       Daily   Docusate Calcium (STOOL SOFTENER PO) Take by mouth.   hydrochlorothiazide (HYDRODIURIL) 25 MG tablet Take  1 tablet  Daily for BP & Fluid Retention / Ankle Swelling                                    /                                 TAKE                                  BY                           MOUTH                             ONCE DAILY   MAGNESIUM PO Take by mouth.   Multiple Vitamin (MULTIVITAMIN WITH MINERALS) TABS tablet Take 1 tablet by mouth daily.   Omega-3 Fatty Acids (FISH OIL) 1000 MG CAPS Take by mouth.   phentermine (ADIPEX-P) 37.5 MG tablet TAKE 1/2 TO 1  TABLET  DAILY IN THE MORNING FOR DIETING AND WEIGHT LOSS   rosuvastatin (CRESTOR) 20 MG tablet TAKE 1 TABLET BY MOUTH ONCE DAILY FOR CHOLESTEROL   sertraline (ZOLOFT) 100 MG tablet Take 1 tablet  Daily for Mood                                                   /  TAKE                                                     BY                                                 MOUTH                                            ONCE DAILY   Wheat Dextrin (BENEFIBER PO) Take by mouth.   Zinc 50 MG TABS Take by mouth daily.   dexamethasone (DECADRON) 4 MG tablet Take 1 tab 3 x day - 3 days, then 2 x day - 3 days, then 1 tab daily   valACYclovir (VALTREX) 1000 MG tablet Take 1 tablet 3 x day for Shingles   No current facility-administered medications on file prior to visit.    ROS: all negative except what is noted in the HPI.   Physical Exam:  BP 112/72   Pulse 71   Temp (!) 97.2 F (36.2 C)   Ht '5\' 6"'$  (1.676 m)   Wt 174 lb 9.6 oz (79.2 kg)   SpO2 97%   BMI 28.18 kg/m   General Appearance:  NAD.  Awake, conversant and cooperative. Eyes: PERRLA, EOMs intact.  Sclera white.  Conjunctiva without erythema. Sinuses: No frontal/maxillary tenderness.  No nasal discharge. Nares patent.  ENT/Mouth: Ext aud canals clear.  Bilateral TMs w/DOL and without erythema or bulging. Hearing intact.  Posterior pharynx without swelling or exudate.  Tonsils without swelling or erythema.  Neck: Supple.  No masses, nodules or thyromegaly. Respiratory: Effort is regular with non-labored breathing. Breath sounds are equal bilaterally without rales, rhonchi, wheezing or stridor.  Cardio: RRR with no MRGs. Brisk peripheral pulses without edema.  Abdomen: Active BS in all four quadrants.  Soft and non-tender without guarding, rebound tenderness, hernias or masses. Lymphatics: Non tender without lymphadenopathy.  Musculoskeletal: Full ROM, 5/5 strength, normal ambulation.  No clubbing or cyanosis. Skin: Right parietal scalp area with scattered areas of moderate erythema and flaky patches.  Right temporal area from eyebrow to top of forehead with moderate erythema and flaky patches.  Scattered raised red macules.  Warm.  Appropriate color for ethnicity.  Neuro: CN II-XII grossly normal. Normal muscle tone without cerebellar symptoms and intact sensation.   Psych: AO X 3,  appropriate mood and affect, insight and judgment.     Darrol Jump, NP 11:34 AM [facility]

## 2022-02-22 NOTE — Telephone Encounter (Addendum)
Pt called to say the   Ketoconazole 2 % FOAM  Needing a PA.

## 2022-02-22 NOTE — Addendum Note (Signed)
Addended by: Chancy Hurter on: 02/22/2022 04:29 PM   Modules accepted: Orders

## 2022-02-22 NOTE — Telephone Encounter (Signed)
Pt id needing a refill on rosuvastatin also

## 2022-02-22 NOTE — Patient Instructions (Signed)
Postherpetic Neuralgia Postherpetic neuralgia (PHN) is nerve pain that occurs after a shingles infection. Shingles is a painful rash that appears on one area of the body, usually on the trunk or face. Shingles is caused by the varicella-zoster virus. This is the same virus that causes chickenpox. In people who have had chickenpox, the virus can resurface years later and cause shingles. PHN appears in the same area where you had the shingles rash. The pain usually goes away after the rash disappears. You may have PHN if you continue to have pain 3 months after your shingles rash has gone away. What are the causes? This condition is caused by damage to your nerves due to inflammation from the varicella-zoster virus. The damage makes your nerves overly sensitive. What increases the risk? The following factors may make you more likely to develop this condition: Being older than 69 years of age. Having severe pain before your shingles rash starts. Having a severe rash. Having shingles in and around the eye area. Having a disease or taking medicine that causes you to have a weakened disease-fighting system (immune system). What are the signs or symptoms? The main symptom of this condition is pain. The pain may: Often be severe and may be described as stabbing, burning, shooting, or feeling like an electric shock. Come and go, or it may be there all the time. Be triggered by light touches on the skin or changes in temperature. You may have itching along with the pain. How is this diagnosed? This condition may be diagnosed based on your symptoms and your history of shingles. Lab studies and other diagnostic tests are usually not needed. How is this treated? There is no cure for this condition. Treatment for PHN will focus on pain relief. Over-the-counter pain relievers do not usually relieve PHN pain. You may need to work with a pain specialist. Treatment may include: Anti-seizure medicines to relieve  nerve pain. Antidepressant medicines to help with pain and improve sleep. A numbing patch worn on the skin (lidocaine patch). Strong pain relievers (opioids). Injections of numbing medicine or anti-inflammatory medicines around irritated nerves. Injections of botulinum toxin to block pain signals between nerves and muscles. Follow these instructions at home: Medicines Take over-the-counter and prescription medicines only as told by your health care provider. Ask your health care provider if the medicine prescribed to you: Requires you to avoid driving or using machinery. Can cause constipation. You may need to take these actions to prevent or treat constipation: Drink enough fluid to keep your urine pale yellow. Take over-the-counter or prescription medicines. Eat foods that are high in fiber, such as beans, whole grains, and fresh fruits and vegetables. Limit foods that are high in fat and processed sugars, such as fried or sweet foods. Managing pain  If directed, put ice on the painful area. To do this: Put ice in a plastic bag. Place a towel between your skin and the bag. Leave the ice on for 20 minutes, 2-3 times a day. Remove the ice if your skin turns bright red. This is very important. If you cannot feel pain, heat, or cold, you have a greater risk of damage to the area. Cover sensitive areas with a bandage (dressing) to reduce friction from clothing rubbing on the area. General instructions It may take a long time to recover from PHN. Work closely with your health care provider and develop a good support system at home. You may consider joining a support group. Wear loose, comfortable clothing. Talk   to your health care provider if you feel depressed or desperate. Living with long-term pain can be depressing. Keep all follow-up visits. This is important. How is this prevented? Getting a vaccination for shingles can prevent PHN. The shingles vaccine is recommended for people older  than age 50. It may prevent shingles and may also lower your risk of PHN if you do get shingles. Contact a health care provider if: Your medicine is not helping. You are struggling to manage your pain at home. Get help right away if: You have thoughts about hurting yourself or others. If you ever feel like you may hurt yourself or others, or have thoughts about taking your own life, get help right away. Go to your nearest emergency department or: Call your local emergency services (911 in the [place] Call a suicide crisis helpline, such as the National Suicide Prevention Lifeline at 1-800-273-8255 or 988 in the [place] This is open 24 hours a day in the [place] Text the Crisis Text Line at 741741 (in the [place] Summary Postherpetic neuralgia (PHN) is a very painful disorder that can occur after an episode of shingles. The pain is often severe and may be described as stabbing, burning, shooting, or feeling like an electric shock. Prescription medicines can be helpful in managing persistent pain. Getting a vaccination for shingles can prevent PHN. This vaccine is recommended for people older than age 50. This information is not intended to replace advice given to you by your health care provider. Make sure you discuss any questions you have with your health care provider. Document Revised: 08/27/2020 Document Reviewed: 01/28/2020 Elsevier Patient Education  2023 Elsevier Inc.  

## 2022-02-24 ENCOUNTER — Telehealth: Payer: Self-pay

## 2022-02-24 NOTE — Telephone Encounter (Signed)
Pt is wanting an update on this, saying insurance is needing more info from [place]

## 2022-02-24 NOTE — Telephone Encounter (Signed)
Prior Authorization for Ketoconazole foam completed and submitted to cover my meds.  Patient requesting update as soon as we can.

## 2022-03-01 NOTE — Telephone Encounter (Signed)
PA approved and patient will be notified through [place].

## 2022-03-02 NOTE — Progress Notes (Addendum)
Complete physical  Assessment:   Ruth Ward was seen today for medicare wellness and follow-up.  Diagnoses and all orders for this visit:  Encounter for general adult physical examination with abnormal findings Due annually  -     EKG 12-Lead -     [place], RETROPERITNL ABD,  LTD  Essential hypertension Continue medication Monitor blood pressure at home; call if consistently over 130/80 Continue DASH diet.   Reminder to go to the ER if any CP, SOB, nausea, dizziness, severe HA, changes vision/speech, left arm numbness and tingling and jaw pain. -     CBC with Differential/Platelet -     COMPLETE METABOLIC PANEL WITH GFR -     Magnesium -     TSH -     EKG 12-Lead  Fatty liver Maintain normal weight, avoid alcohol/tylenol, will monitor LFTs -     COMPLETE METABOLIC PANEL WITH GFR  Vitamin D deficiency -     VITAMIN D 25 Hydroxy (Vit-D Deficiency, Fractures)  Abnormal glucose Recent A1Cs at goal Discussed diet/exercise, weight management  A1c  Medication management -     CBC with Differential/Platelet -     COMPLETE METABOLIC PANEL WITH GFR -     Magnesium  Hyperlipidemia, unspecified hyperlipidemia type Continue medications Continue low cholesterol diet and exercise.  Check lipid panel.  -     Lipid panel -     TSH  Postherpetic neuralgia Has stopped Valacyclovir- symptoms improving Continues to have itching/irritation of scalp with only relief provided by Ketoconazole foam- trying to have prior auth for approval  Depression, major, recurrent, in complete remission ([place]) In remission on zoloft; has failed attempts to taper; continue med and monitor Lifestyle discussed: diet/exerise, sleep hygiene, stress management, hydration  History of adenomatous polyp of colon Due for colonoscopy, will call to schedule  Overweight, BMI 28,adult Continue to recommend diet heavy in fruits and veggies and low in animal meats, cheeses, and dairy products, appropriate calorie  intake Discuss exercise recommendations routinely Continue to monitor weight at each visit  Screening for ischemic heart disease - EKG  Screening for hematuria/proteinuria - UA with microscopic and reflex culture - Microalbumin/creatinine urine ratio  Screening for AAA - U/S ABD Retroperitoneal LTD  Screening for thyroid disorder -TSH    Over 40 minutes of exam, counseling, chart review and critical decision making was performed Future Appointments  Date Time Provider [place]  05/05/2022 11:00 AM Alycia Rossetti, NP [facility] None  03/04/2023 10:00 AM Alycia Rossetti, NP [facility] None       Subjective:  Ruth Ward is a 67 y.o. female who presents for complete physical examination   Very pleasant female who states is doing well and has no medical concerns today.   She has hx of recurrent major depression currently well controlled by zoloft 100 mg.   She had trigeminal herpes zoster in 11/2021- symptoms have improved but will still get occ nerve tingling trigeminally as well as persistent itching in the scalp  BMI is Body mass index is 28.47 kg/m., she has been working on diet and exercise.She is working on diet and exercise Wt Readings from Last 3 Encounters:  03/03/22 176 lb 6.4 oz (80 kg)  02/22/22 174 lb 9.6 oz (79.2 kg)  12/07/21 181 lb 3.2 oz (82.2 kg)   Her blood pressure has been controlled at home, today their BP is BP: 130/82 BP Readings from Last 3 Encounters:  03/03/22 130/82  02/22/22 112/72  12/07/21 132/82  She does workout. She denies chest pain, shortness of breath, dizziness.   She is on cholesterol medication (rosuvastatin 20 mg daily) and denies myalgias. Her cholesterol is not at goal of LDL <100. The cholesterol last visit was:   Lab Results  Component Value Date   CHOL 138 11/16/2021   HDL 69 11/16/2021   LDLCALC 45 11/16/2021   TRIG 161 (H) 11/16/2021   CHOLHDL 2.0 11/16/2021    She has been working on diet and  exercise for glucose management, and denies paresthesia of the feet, polydipsia, polyuria, visual disturbances, vomiting and weight loss. Last A1C in the office was:  Lab Results  Component Value Date   HGBA1C 5.4 11/16/2021   Last GFR: Lab Results  Component Value Date   EGFR 100 11/16/2021    Patient is on Vitamin D supplement, was taking 10000 IU daily for several years, has since reduced to 5000 IU daily.  Lab Results  Component Value Date   VD25OH 65 02/26/2021      Medication Review: Current Outpatient Medications on File Prior to Visit  Medication Sig Dispense Refill   aspirin 81 MG tablet Take 81 mg by mouth daily.     atenolol (TENORMIN) 25 MG tablet Take 1 tablet Daily for BP                                                           /                                        Take                                      by                                       mouth                               once daily 90 tablet 3   Cholecalciferol (VITAMIN D3) 125 MCG (5000 UT) TABS Take      1 capsule       Daily     Docusate Calcium (STOOL SOFTENER PO) Take by mouth.     gabapentin (NEURONTIN) 100 MG capsule Take 1 capsule (100 mg total) by mouth 3 (three) times daily. 90 capsule 0   hydrochlorothiazide (HYDRODIURIL) 25 MG tablet Take  1 tablet  Daily for BP & Fluid Retention / Ankle Swelling                                    /                                 TAKE  BY                           MOUTH                             ONCE DAILY 90 tablet 3   MAGNESIUM PO Take by mouth.     Multiple Vitamin (MULTIVITAMIN WITH MINERALS) TABS tablet Take 1 tablet by mouth daily.     Omega-3 Fatty Acids (FISH OIL) 1000 MG CAPS Take by mouth.     phentermine (ADIPEX-P) 37.5 MG tablet TAKE 1/2 TO 1  TABLET  DAILY IN THE MORNING FOR DIETING AND WEIGHT LOSS 90 tablet 1   rosuvastatin (CRESTOR) 20 MG tablet Take 1 tablet (20 mg total) by mouth daily. for cholesterol. 90  tablet 0   sertraline (ZOLOFT) 100 MG tablet Take 1 tablet  Daily for Mood                                                   /                                       TAKE                                                     BY                                                 MOUTH                                            ONCE DAILY 90 tablet 3   Wheat Dextrin (BENEFIBER PO) Take by mouth.     Zinc 50 MG TABS Take by mouth daily.     Ascorbic Acid (VITAMIN C) 1000 MG tablet Take 1,000 mg by mouth daily. (Patient not taking: Reported on 03/03/2022)     dexamethasone (DECADRON) 4 MG tablet Take 1 tab 3 x day - 3 days, then 2 x day - 3 days, then 1 tab daily (Patient not taking: Reported on 03/03/2022) 20 tablet 0   valACYclovir (VALTREX) 1000 MG tablet Take 1 tablet 3 x day for Shingles (Patient not taking: Reported on 03/03/2022) 30 tablet 0   No current facility-administered medications on file prior to visit.    Allergies  Allergen Reactions   Atorvastatin Nausea Only    Current Problems (verified) Patient Active Problem List   Diagnosis Date Noted   COVID 03/19/2020   Osteopenia 07/12/2019   History of adenomatous polyp of colon 04/04/2019   Fatty liver 03/01/2017   Depression, major, recurrent, in complete remission ([place]) 05/02/2014   Medication management 10/09/2013   Hypertension    Hyperlipidemia    Vitamin D deficiency  Screening Tests Immunization History  Administered Date(s) Administered   Influenza, High Dose Seasonal PF 12/27/2018, 11/23/2019, 11/16/2021   Influenza-Unspecified 11/15/2012, 12/19/2017, 12/17/2020   Moderna SARS-COV2 Booster Vaccination 11/23/2019   Moderna Sars-Covid-2 Vaccination 03/23/2019, 04/18/2019, 11/23/2019   PNEUMOCOCCAL CONJUGATE-20 05/04/2021   PPD Test 03/07/2013, 05/02/2014, 05/22/2015, 10/10/2017   Pfizer Covid-19 Vaccine Bivalent Booster 42yr & up 11/28/2020   Pneumococcal-Unspecified 02/15/2009   Td 02/15/2002   Tdap 03/02/2012    Zoster, Live 12/02/2015   Preventative care: Last colonoscopy: 04/2017, adenomatous polyps, will call to schedule Last mammogram: 03/02/2019 will call to schedule Last pap smear/pelvic exam: 11/2015 neg HPV, declines further  DEXA: 07/12/19 osteopenia  Prior vaccinations: TD or Tdap: 2014  Influenza: 12/2020  Pneumococcal: due 1 year after prevnar  Prevnar13: Defer due to covid 19 vaccine scheduled Shingles/Zostavax: 2017,  Covid 19: 1/2, next scheduled this week  Names of Other Physician/Practitioners you currently use: 1. [facility] here for primary care 2. Dr. GKaty Fitch eye doctor, scheduled for this week 3. Dr. BMelina Copa dentist, last visit 2021, goes [id number] Care Team: McUnk PintoMD as PCP - General (Internal Medicine) PeIrene ShipperMD as Consulting Physician (Gastroenterology)  SURGICAL HISTORY She  has a past surgical history that includes Cesarean section (1975 and 1980); Tubal ligation; Abdominal surgery (09/2016); and I & D extremity (Right, 12/14/2018). FAMILY HISTORY Her family history includes Cancer in her father; Diabetes in her brother, brother, mother, and another family member; Heart disease in her father; Hypertension in her brother, father, and mother; Prostate cancer in her father. SOCIAL HISTORY She  reports that she quit smoking about 36 years ago. Her smoking use included cigarettes. She has never used smokeless tobacco. She reports that she does not drink alcohol and does not use drugs.   Review of Systems  Constitutional:  Negative for malaise/fatigue and weight loss.  HENT:  Negative for hearing loss and tinnitus.   Eyes:  Negative for blurred vision and double vision.  Respiratory:  Negative for cough, sputum production, shortness of breath and wheezing.   Cardiovascular:  Negative for chest pain, palpitations, orthopnea, claudication, leg swelling and PND.  Gastrointestinal:  Negative for abdominal pain,  blood in stool, constipation, diarrhea, heartburn, melena, nausea and vomiting.  Genitourinary: Negative.   Musculoskeletal:  Negative for falls, joint pain and myalgias.  Skin:  Positive for itching (scalp). Negative for rash.  Neurological:  Positive for tingling (trigeminal nerve on right intermittently). Negative for dizziness, sensory change, weakness and headaches.  Endo/Heme/Allergies:  Positive for environmental allergies. Negative for polydipsia.  Psychiatric/Behavioral: Negative.  Negative for depression, memory loss, substance abuse and suicidal ideas. The patient is not nervous/anxious and does not have insomnia.   All other systems reviewed and are negative.    Objective:     Today's Vitals   03/03/22 1011  BP: 130/82  Pulse: 78  Temp: (!) 97.5 F (36.4 C)  SpO2: 98%  Weight: 176 lb 6.4 oz (80 kg)  Height: '5\' 6"'$  (1.676 m)   Body mass index is 28.47 kg/m.  General appearance: alert, no distress, WD/WN, female HEENT: normocephalic, sclerae anicteric, TMs pearly, nares patent, no discharge or erythema,  Oral cavity: Mask in place; oral exam deferred Neck: supple, no lymphadenopathy, no thyromegaly, no masses Heart: RRR, normal S1, S2, no murmurs Lungs: CTA bilaterally, no wheezes, rhonchi, or rales Abdomen: +bs, soft, non tender, non distended, no masses, no hepatomegaly, no splenomegaly Breast: defer to GYN Pelvic: defer  to GYN Musculoskeletal: nontender, no swelling, no obvious deformity Extremities: no edema, no cyanosis, no clubbing Pulses: 2+ symmetric, upper and lower extremities, normal cap refill Neurological: alert, oriented x 3, CN2-12 intact, strength normal upper extremities and lower extremities, sensation normal throughout, DTRs 2+ throughout, no cerebellar signs, gait normal Psychiatric: normal affect, behavior normal, pleasant   EKG: NSR, no ST changes  AAA: < 3 cm   Alycia Rossetti, NP   03/03/2022

## 2022-03-03 ENCOUNTER — Other Ambulatory Visit: Payer: Self-pay

## 2022-03-03 ENCOUNTER — Ambulatory Visit (INDEPENDENT_AMBULATORY_CARE_PROVIDER_SITE_OTHER): Payer: Medicare Other | Admitting: Nurse Practitioner

## 2022-03-03 ENCOUNTER — Encounter: Payer: Self-pay | Admitting: Nurse Practitioner

## 2022-03-03 VITALS — BP 130/82 | HR 78 | Temp 97.5°F | Ht 66.0 in | Wt 176.4 lb

## 2022-03-03 DIAGNOSIS — Z860101 Personal history of adenomatous and serrated colon polyps: Secondary | ICD-10-CM

## 2022-03-03 DIAGNOSIS — E559 Vitamin D deficiency, unspecified: Secondary | ICD-10-CM

## 2022-03-03 DIAGNOSIS — K760 Fatty (change of) liver, not elsewhere classified: Secondary | ICD-10-CM

## 2022-03-03 DIAGNOSIS — I700 Atherosclerosis of aorta: Secondary | ICD-10-CM

## 2022-03-03 DIAGNOSIS — F3342 Major depressive disorder, recurrent, in full remission: Secondary | ICD-10-CM

## 2022-03-03 DIAGNOSIS — R7309 Other abnormal glucose: Secondary | ICD-10-CM | POA: Diagnosis not present

## 2022-03-03 DIAGNOSIS — Z0000 Encounter for general adult medical examination without abnormal findings: Secondary | ICD-10-CM

## 2022-03-03 DIAGNOSIS — Z136 Encounter for screening for cardiovascular disorders: Secondary | ICD-10-CM

## 2022-03-03 DIAGNOSIS — Z1389 Encounter for screening for other disorder: Secondary | ICD-10-CM

## 2022-03-03 DIAGNOSIS — Z6824 Body mass index (BMI) 24.0-24.9, adult: Secondary | ICD-10-CM

## 2022-03-03 DIAGNOSIS — Z86010 Personal history of colonic polyps: Secondary | ICD-10-CM

## 2022-03-03 DIAGNOSIS — E782 Mixed hyperlipidemia: Secondary | ICD-10-CM

## 2022-03-03 DIAGNOSIS — I10 Essential (primary) hypertension: Secondary | ICD-10-CM | POA: Diagnosis not present

## 2022-03-03 DIAGNOSIS — Z6828 Body mass index (BMI) 28.0-28.9, adult: Secondary | ICD-10-CM

## 2022-03-03 DIAGNOSIS — B0229 Other postherpetic nervous system involvement: Secondary | ICD-10-CM

## 2022-03-03 DIAGNOSIS — L209 Atopic dermatitis, unspecified: Secondary | ICD-10-CM

## 2022-03-03 DIAGNOSIS — Z6829 Body mass index (BMI) 29.0-29.9, adult: Secondary | ICD-10-CM

## 2022-03-03 DIAGNOSIS — Z79899 Other long term (current) drug therapy: Secondary | ICD-10-CM | POA: Diagnosis not present

## 2022-03-03 DIAGNOSIS — E663 Overweight: Secondary | ICD-10-CM

## 2022-03-03 DIAGNOSIS — Z7689 Persons encountering health services in other specified circumstances: Secondary | ICD-10-CM | POA: Insufficient documentation

## 2022-03-03 DIAGNOSIS — Z0001 Encounter for general adult medical examination with abnormal findings: Secondary | ICD-10-CM

## 2022-03-03 DIAGNOSIS — Z1329 Encounter for screening for other suspected endocrine disorder: Secondary | ICD-10-CM

## 2022-03-03 MED ORDER — KETOCONAZOLE 2 % EX FOAM: 1.0000 | Freq: Once | CUTANEOUS | 0 refills | Status: AC

## 2022-03-03 NOTE — Patient Instructions (Signed)

## 2022-03-04 LAB — CBC WITH DIFFERENTIAL/PLATELET
Absolute Monocytes: 371 cells/uL (ref 200–950)
Basophils Absolute: 32 cells/uL (ref 0–200)
Basophils Relative: 0.6 %
Eosinophils Absolute: 58 cells/uL (ref 15–500)
Eosinophils Relative: 1.1 %
HCT: 40.6 % (ref 35.0–45.0)
Hemoglobin: 14.2 g/dL (ref 11.7–15.5)
Lymphs Abs: 1908 cells/uL (ref 850–3900)
MCH: 33.8 pg — ABNORMAL HIGH (ref 27.0–33.0)
MCHC: 35 g/dL (ref 32.0–36.0)
MCV: 96.7 fL (ref 80.0–100.0)
MPV: 10.1 fL (ref 7.5–12.5)
Monocytes Relative: 7 %
Neutro Abs: 2931 cells/uL (ref 1500–7800)
Neutrophils Relative %: 55.3 %
Platelets: 228 10*3/uL (ref 140–400)
RBC: 4.2 10*6/uL (ref 3.80–5.10)
RDW: 12.5 % (ref 11.0–15.0)
Total Lymphocyte: 36 %
WBC: 5.3 10*3/uL (ref 3.8–10.8)

## 2022-03-04 LAB — COMPLETE METABOLIC PANEL WITH GFR
AG Ratio: 2.2 (calc) (ref 1.0–2.5)
ALT: 39 U/L — ABNORMAL HIGH (ref 6–29)
AST: 26 U/L (ref 10–35)
Albumin: 4.3 g/dL (ref 3.6–5.1)
Alkaline phosphatase (APISO): 83 U/L (ref 37–153)
BUN: 14 mg/dL (ref 7–25)
CO2: 30 mmol/L (ref 20–32)
Calcium: 9.3 mg/dL (ref 8.6–10.4)
Chloride: 101 mmol/L (ref 98–110)
Creat: 0.57 mg/dL (ref 0.50–1.05)
Globulin: 2 g/dL (calc) (ref 1.9–3.7)
Glucose, Bld: 88 mg/dL (ref 65–99)
Potassium: 4 mmol/L (ref 3.5–5.3)
Sodium: 139 mmol/L (ref 135–146)
Total Bilirubin: 0.4 mg/dL (ref 0.2–1.2)
Total Protein: 6.3 g/dL (ref 6.1–8.1)
eGFR: 99 mL/min/{1.73_m2} (ref >=60)

## 2022-03-04 LAB — NO CULTURE INDICATED

## 2022-03-04 LAB — URINALYSIS W MICROSCOPIC + REFLEX CULTURE
Bacteria, UA: NONE SEEN /HPF
Bilirubin Urine: NEGATIVE
Glucose, UA: NEGATIVE
Hgb urine dipstick: NEGATIVE
Hyaline Cast: NONE SEEN /LPF
Ketones, ur: NEGATIVE
Leukocyte Esterase: NEGATIVE
Nitrites, Initial: NEGATIVE
Protein, ur: NEGATIVE
RBC / HPF: NONE SEEN /HPF (ref 0–2)
Specific Gravity, Urine: 1.011 (ref 1.001–1.035)
Squamous Epithelial / HPF: NONE SEEN /HPF (ref <=5)
WBC, UA: NONE SEEN /HPF (ref 0–5)
pH: 6.5 (ref 5.0–8.0)

## 2022-03-04 LAB — MICROALBUMIN / CREATININE URINE RATIO
Creatinine, Urine: 38 mg/dL (ref 20–275)
Microalb Creat Ratio: 5 mcg/mg creat (ref <30)
Microalb, Ur: 0.2 mg/dL

## 2022-03-04 LAB — HEMOGLOBIN A1C
Hgb A1c MFr Bld: 5.7 % of total Hgb — ABNORMAL HIGH (ref <5.7)
Mean Plasma Glucose: 117 mg/dL
eAG (mmol/L): 6.5 mmol/L

## 2022-03-04 LAB — LIPID PANEL
Cholesterol: 136 mg/dL (ref <200)
HDL: 46 mg/dL — ABNORMAL LOW (ref >=50)
LDL Cholesterol (Calc): 64 mg/dL (calc)
Non-HDL Cholesterol (Calc): 90 mg/dL (calc) (ref <130)
Total CHOL/HDL Ratio: 3 (calc) (ref <5.0)
Triglycerides: 191 mg/dL — ABNORMAL HIGH (ref <150)

## 2022-03-04 LAB — TSH: TSH: 2.24 mIU/L (ref 0.40–4.50)

## 2022-03-04 LAB — VITAMIN D 25 HYDROXY (VIT D DEFICIENCY, FRACTURES): Vit D, 25-Hydroxy: 65 ng/mL (ref 30–100)

## 2022-03-04 LAB — MAGNESIUM: Magnesium: 2.2 mg/dL (ref 1.5–2.5)

## 2022-03-09 DIAGNOSIS — H26491 Other secondary cataract, right eye: Secondary | ICD-10-CM | POA: Diagnosis not present

## 2022-03-09 DIAGNOSIS — H43812 Vitreous degeneration, left eye: Secondary | ICD-10-CM | POA: Diagnosis not present

## 2022-03-09 DIAGNOSIS — Z961 Presence of intraocular lens: Secondary | ICD-10-CM | POA: Diagnosis not present

## 2022-03-11 DIAGNOSIS — L668 Other cicatricial alopecia: Secondary | ICD-10-CM | POA: Diagnosis not present

## 2022-03-16 DIAGNOSIS — L821 Other seborrheic keratosis: Secondary | ICD-10-CM | POA: Diagnosis not present

## 2022-03-16 DIAGNOSIS — B0229 Other postherpetic nervous system involvement: Secondary | ICD-10-CM | POA: Diagnosis not present

## 2022-03-16 DIAGNOSIS — Z85828 Personal history of other malignant neoplasm of skin: Secondary | ICD-10-CM | POA: Diagnosis not present

## 2022-03-16 DIAGNOSIS — R229 Localized swelling, mass and lump, unspecified: Secondary | ICD-10-CM | POA: Diagnosis not present

## 2022-03-16 DIAGNOSIS — L570 Actinic keratosis: Secondary | ICD-10-CM | POA: Diagnosis not present

## 2022-03-16 DIAGNOSIS — Z08 Encounter for follow-up examination after completed treatment for malignant neoplasm: Secondary | ICD-10-CM | POA: Diagnosis not present

## 2022-03-16 DIAGNOSIS — C44729 Squamous cell carcinoma of skin of left lower limb, including hip: Secondary | ICD-10-CM | POA: Diagnosis not present

## 2022-04-13 DIAGNOSIS — L2089 Other atopic dermatitis: Secondary | ICD-10-CM | POA: Diagnosis not present

## 2022-04-13 DIAGNOSIS — L668 Other cicatricial alopecia: Secondary | ICD-10-CM | POA: Diagnosis not present

## 2022-05-04 NOTE — Progress Notes (Deleted)
MEDICARE ANNUAL WELLNESS VISIT AND FOLLOW UP  Assessment:   Ruth Ward was seen today for medicare wellness and follow-up.  Diagnoses and all orders for this visit:  Encounter for Medicare annual wellness, second Yearly  Essential hypertension Continue medication Monitor blood pressure at home; call if consistently over 130/80 Continue DASH diet.   Reminder to go to the ER if any CP, SOB, nausea, dizziness, severe HA, changes vision/speech, left arm numbness and tingling and jaw pain.   Vitamin D deficiency Continue supplementation to maintain goal of 70-100 Taking Vitamin D 5,000 IU daily   Other abnormal glucose Recent A1Cs at goal Discussed diet/exercise, weight management  Defer A1C   Hyperlipidemia, unspecified hyperlipidemia type Continue medication: Pravastatin 40mg  Continue low cholesterol diet and exercise.    Depression, major, recurrent, in complete remission ([place]) In remission on zoloft; has failed attempts to taper; continue med and monitor Lifestyle discussed: diet/exerise, sleep hygiene, stress management, hydration  History of adenomatous polyp of colon UTD on colonoscopies; due 2024 Discussed with patient  Overweight BMI 29.0-29.9,adult Continue to recommend diet heavy in fruits and veggies and low in animal meats, cheeses, and dairy products, appropriate calorie intake Discuss exercise recommendations routinely Continue Phentermine and Topamax F/U in 3 months  Estrogen deficiency Dexa UTD  Fatty liver Maintain normal weight, avoid alcohol/tylenol, will monitor LFTs   Medication management Continued  Over 40 minutes of face to face interview, exam, counseling, chart review and critical decision making was performed.  Future Appointments  Date Time Provider [place]  05/05/2022 11:00 AM Alycia Rossetti, NP [facility] None  03/04/2023 10:00 AM Alycia Rossetti, NP [facility] None     Plan:   During the course of the visit  the patient was educated and counseled about appropriate screening and preventive services including:   Pneumococcal vaccine  Prevnar 13 Influenza vaccine Td vaccine Screening electrocardiogram Bone densitometry screening Colorectal cancer screening Diabetes screening Glaucoma screening Nutrition counseling  Advanced directives: requested   Subjective:  Ruth Ward is a 67 y.o. female who presents for Medicare Annual Wellness Visit and 3 month follow up.   She is currently on Phentermine and Topamax for weight loss and doing well.  States she is feeling great.   BMI is There is no height or weight on file to calculate BMI., she has been working on diet and exercise. She is down 8 pounds in 2 months Wt Readings from Last 3 Encounters:  03/03/22 176 lb 6.4 oz (80 kg)  02/22/22 174 lb 9.6 oz (79.2 kg)  12/07/21 181 lb 3.2 oz (82.2 kg)   Her blood pressure has been controlled at home, today their BP is    BP Readings from Last 3 Encounters:  03/03/22 130/82  02/22/22 112/72  12/07/21 132/82    She does workout. She denies chest pain, shortness of breath, dizziness.   She is on cholesterol medication (pravastatin 40 mg daily) and denies myalgias. Her cholesterol is at goal of LDL <100. The cholesterol last visit was:   Lab Results  Component Value Date   CHOL 136 03/03/2022   HDL 46 (L) 03/03/2022   LDLCALC 64 03/03/2022   TRIG 191 (H) 03/03/2022   CHOLHDL 3.0 03/03/2022    She has been working on diet and exercise for glucose management, and denies paresthesia of the feet, polydipsia, polyuria, visual disturbances, vomiting and weight loss. Last A1C in the office was:  Lab Results  Component Value Date   HGBA1C 5.7 (H) 03/03/2022  Last GFR: Lab Results  Component Value Date   GFRNONAA 101 05/01/2020   Patient is on Vitamin D supplement, was taking 10000 IU daily for several years, has since reduced to 5000 IU daily.  Lab Results  Component Value Date    VD25OH 65 03/03/2022     She has hx of recurrent major depression currently well controlled by zoloft 100 mg.    Medication Review: Current Outpatient Medications on File Prior to Visit  Medication Sig Dispense Refill   aspirin 81 MG tablet Take 81 mg by mouth daily.     atenolol (TENORMIN) 25 MG tablet Take 1 tablet Daily for BP                                                           /                                        Take                                      by                                       mouth                               once daily 90 tablet 3   Cholecalciferol (VITAMIN D3) 125 MCG (5000 UT) TABS Take      1 capsule       Daily     Docusate Calcium (STOOL SOFTENER PO) Take by mouth.     gabapentin (NEURONTIN) 100 MG capsule Take 1 capsule (100 mg total) by mouth 3 (three) times daily. 90 capsule 0   hydrochlorothiazide (HYDRODIURIL) 25 MG tablet Take  1 tablet  Daily for BP & Fluid Retention / Ankle Swelling                                    /                                 TAKE                                  BY                           MOUTH                             ONCE DAILY 90 tablet 3   MAGNESIUM PO Take by mouth.     Multiple Vitamin (MULTIVITAMIN WITH MINERALS) TABS tablet Take 1 tablet by mouth daily.     Omega-3 Fatty Acids (FISH  OIL) 1000 MG CAPS Take by mouth.     phentermine (ADIPEX-P) 37.5 MG tablet TAKE 1/2 TO 1  TABLET  DAILY IN THE MORNING FOR DIETING AND WEIGHT LOSS 90 tablet 1   rosuvastatin (CRESTOR) 20 MG tablet Take 1 tablet (20 mg total) by mouth daily. for cholesterol. 90 tablet 0   sertraline (ZOLOFT) 100 MG tablet Take 1 tablet  Daily for Mood                                                   /                                       TAKE                                                     BY                                                 MOUTH                                            ONCE DAILY 90 tablet 3   Wheat Dextrin (BENEFIBER PO)  Take by mouth.     Zinc 50 MG TABS Take by mouth daily.     No current facility-administered medications on file prior to visit.    Allergies  Allergen Reactions   Atorvastatin Nausea Only    Current Problems (verified) Patient Active Problem List   Diagnosis Date Noted   Overweight with body mass index (BMI) of 29 to 29.9 in adult 03/03/2022   COVID 03/19/2020   Osteopenia 07/12/2019   History of adenomatous polyp of colon 04/04/2019   Fatty liver 03/01/2017   Depression, major, recurrent, in complete remission ([place]) 05/02/2014   Medication management 10/09/2013   Hypertension    Hyperlipidemia    Vitamin D deficiency     Screening Tests Immunization History  Administered Date(s) Administered   Covid-19, Mrna,Vaccine(Spikevax)55yrs and older 11/24/2021   Influenza, High Dose Seasonal PF 12/27/2018, 11/23/2019, 11/24/2021   Influenza-Unspecified 11/15/2012, 12/19/2017, 12/17/2020   Moderna SARS-COV2 Booster Vaccination 11/23/2019   Moderna Sars-Covid-2 Vaccination 03/23/2019, 04/18/2019, 11/23/2019   PNEUMOCOCCAL CONJUGATE-20 05/04/2021   PPD Test 03/07/2013, 05/02/2014, 05/22/2015, 10/10/2017   Pfizer Covid-19 Vaccine Bivalent Booster 46yrs & up 11/28/2020   Pneumococcal-Unspecified 02/15/2009   Td 02/15/2002   Tdap 03/02/2012   Zoster, Live 12/02/2015   Preventative care: Last colonoscopy: 04/2017, adenomatous polyps, due 2024 Dr. Fuller Plan Last mammogram: 03/25/21 Last pap smear/pelvic exam: 11/2015 neg HPV, declines further  DEXA: 2021 osteopenia  Prior vaccinations: TD or Tdap: 2014  Influenza: 11/2019  Pneumococcal: due 1 year after prevnar  Prevnar13: Defer due to covid 19 vaccine scheduled Shingles/Zostavax: 2017,  Covid 19: 1/2, next scheduled this week  Names of Other Physician/Practitioners you currently  use: 1. [facility] here for primary care 2. Dr. Shanon Rosser, eye doctor, last visit 2021 3. Dr. Melina Copa, dentist, last  visit 2022, goes q28m  Patient Care Team: Unk Pinto, MD as PCP - General (Internal Medicine) Irene Shipper, MD as Consulting Physician (Gastroenterology)  SURGICAL HISTORY She  has a past surgical history that includes Cesarean section (1975 and 1980); Tubal ligation; Abdominal surgery (09/2016); and I & D extremity (Right, 12/14/2018). FAMILY HISTORY Her family history includes Cancer in her father; Diabetes in her brother, brother, mother, and another family member; Heart disease in her father; Hypertension in her brother, father, and mother; Prostate cancer in her father. SOCIAL HISTORY She  reports that she quit smoking about 36 years ago. Her smoking use included cigarettes. She has never used smokeless tobacco. She reports that she does not drink alcohol and does not use drugs.   MEDICARE WELLNESS OBJECTIVES: Physical activity:   Cardiac risk factors:   Depression/mood screen:      05/04/2021   11:20 AM  Depression screen PHQ 2/9  Decreased Interest 0  Down, Depressed, Hopeless 0  PHQ - 2 Score 0    ADLs:      No data to display            Cognitive Testing  Alert? Yes  Normal Appearance?Yes  Oriented to person? Yes  Place? Yes   Time? Yes  Recall of three objects?  Yes  Can perform simple calculations? Yes  Displays appropriate judgment?Yes  Can read the correct time from a watch face?Yes  EOL planning:    Review of Systems  Constitutional:  Negative for malaise/fatigue and weight loss.  HENT:  Negative for hearing loss and tinnitus.   Eyes:  Negative for blurred vision and double vision.  Respiratory:  Negative for cough, sputum production, shortness of breath and wheezing.   Cardiovascular:  Negative for chest pain, palpitations, orthopnea, claudication, leg swelling and PND.  Gastrointestinal:  Negative for abdominal pain, blood in stool, constipation, diarrhea, heartburn, melena, nausea and vomiting.  Genitourinary: Negative.   Musculoskeletal:   Negative for falls, joint pain and myalgias.  Skin:  Negative for rash.  Neurological:  Negative for dizziness, tingling, sensory change, weakness and headaches.  Endo/Heme/Allergies:  Positive for environmental allergies. Negative for polydipsia.  Psychiatric/Behavioral: Negative.  Negative for depression, memory loss, substance abuse and suicidal ideas. The patient is not nervous/anxious and does not have insomnia.   All other systems reviewed and are negative.    Objective:     There were no vitals filed for this visit.  There is no height or weight on file to calculate BMI.  General appearance: alert, no distress, WD/WN, female HEENT: normocephalic, sclerae anicteric, TMs pearly, nares patent, no discharge or erythema,  Oral cavity: Mask in place; oral exam deferred Neck: supple, no lymphadenopathy, no thyromegaly, no masses Heart: RRR, normal S1, S2, no murmurs Lungs: CTA bilaterally, no wheezes, rhonchi, or rales Abdomen: +bs, soft, non tender, non distended, no masses, no hepatomegaly, no splenomegaly Musculoskeletal: nontender, no swelling, no obvious deformity Extremities: no edema, no cyanosis, no clubbing Pulses: 2+ symmetric, upper and lower extremities, normal cap refill Neurological: alert, oriented x 3, CN2-12 intact, strength normal upper extremities and lower extremities, sensation normal throughout, DTRs 2+ throughout, no cerebellar signs, gait normal Psychiatric: normal affect, behavior normal, pleasant     Medicare Attestation I have personally reviewed: The patient's medical and social history Their use of alcohol, tobacco or  illicit drugs Their current medications and supplements The patient's functional ability including ADLs,fall risks, home safety risks, cognitive, and hearing and visual impairment Diet and physical activities Evidence for depression or mood disorders  The patient's weight, height, BMI, and visual acuity have been recorded in the chart.   I have made referrals, counseling, and provided education to the patient based on review of the above and I have provided the patient with a written personalized care plan for preventive services.     Alycia Rossetti, NP   05/01/2020

## 2022-05-05 ENCOUNTER — Ambulatory Visit: Payer: Medicare Other | Admitting: Nurse Practitioner

## 2022-05-13 DIAGNOSIS — L2089 Other atopic dermatitis: Secondary | ICD-10-CM | POA: Diagnosis not present

## 2022-05-13 DIAGNOSIS — L281 Prurigo nodularis: Secondary | ICD-10-CM | POA: Diagnosis not present

## 2022-05-25 NOTE — Progress Notes (Unsigned)
MEDICARE ANNUAL WELLNESS VISIT AND FOLLOW UP  Assessment:   [name] was seen today for medicare wellness and follow-up.  Diagnoses and all orders for this visit:  Encounter for Medicare annual wellness, second Yearly  Essential hypertension Continue medication: atenolol 25 mg QD and HCTZ 25 mg QD Monitor blood pressure at home; call if consistently over 130/80 Continue DASH diet.   Reminder to go to the ER if any CP, SOB, nausea, dizziness, severe HA, changes vision/speech, left arm numbness and tingling and jaw pain.   Vitamin D deficiency Continue supplementation to maintain goal of 70-100 Taking Vitamin D 5,000 IU daily   Other abnormal glucose Recent A1Cs at goal Discussed diet/exercise, weight management  Defer A1C and check CMP   Hyperlipidemia, unspecified hyperlipidemia type Continue medication: Rosuvastatin 20 mg Continue low cholesterol diet and exercise.    Depression, major, recurrent, in complete remission (HCC) In remission on zoloft; has failed attempts to taper; continue med and monitor Lifestyle discussed: diet/exerise, sleep hygiene, stress management, hydration  History of adenomatous polyp of colon UTD on colonoscopies; due 2024, declines colonoscopy but is willing to do cologuard which is ordered Discussed with patient  Screening for colon cancer Cologuard  Overweight BMI 28.0-28.9,adult Continue to recommend diet heavy in fruits and veggies and low in animal meats, cheeses, and dairy products, appropriate calorie intake Discuss exercise recommendations routinely Sent script for Wegovy 2.5 mg SQ QW to use for weight loss with comorbidities of Hypertension and high cholesterol F/U in 3 months  Estrogen deficiency Dexa UTD  Fatty liver Maintain normal weight, avoid alcohol/tylenol, will monitor LFTs   Medication management Continued  Over 40 minutes of face to face interview, exam, counseling, chart review and critical decision making  was performed.  Future Appointments  Date Time Provider Department Center  03/04/2023 10:00 AM Raynelle Dick, NP [facility] None  06/02/2023 11:00 AM Raynelle Dick, NP [facility] None     Plan:   During the course of the visit the patient was educated and counseled about appropriate screening and preventive services including:   Pneumococcal vaccine  Prevnar 13 Influenza vaccine Td vaccine Screening electrocardiogram Bone densitometry screening Colorectal cancer screening Diabetes screening Glaucoma screening Nutrition counseling  Advanced directives: requested   Subjective:  Ruth Ward is a 67 y.o. female who presents for Medicare Annual Wellness Visit and 3 month follow up.    BMI is Body mass index is 28.76 kg/m., she has been working on diet and exercise. She was previously using phentermine and topamax but has discontinued Wt Readings from Last 3 Encounters:  05/26/22 178 lb 3.2 oz (80.8 kg)  03/03/22 176 lb 6.4 oz (80 kg)  02/22/22 174 lb 9.6 oz (79.2 kg)   Her blood pressure has been controlled at home on Atenolol 25 mg QD, HCTZ 25 mg QD, today their BP is BP: 130/70  BP Readings from Last 3 Encounters:  05/26/22 130/70  03/03/22 130/82  02/22/22 112/72  She does workout. She denies chest pain, shortness of breath, dizziness.   She is on cholesterol medication (rosuvastatin 20 mg daily) and denies myalgias. Her cholesterol is at goal of LDL <100. The cholesterol last visit was:   Lab Results  Component Value Date   CHOL 136 03/03/2022   HDL 46 (L) 03/03/2022   LDLCALC 64 03/03/2022   TRIG 191 (H) 03/03/2022   CHOLHDL 3.0 03/03/2022    She has been working on diet and exercise for glucose management, and denies paresthesia of  the feet, polydipsia, polyuria, visual disturbances, vomiting and weight loss. Last A1C in the office was:  Lab Results  Component Value Date   HGBA1C 5.7 (H) 03/03/2022   Last GFR: Lab Results  Component Value Date    EGFR 99 03/03/2022    Patient is on Vitamin D supplement, was taking 14239 IU daily for several years, has since reduced to 5000 IU daily.  Lab Results  Component Value Date   VD25OH 65 03/03/2022     She has hx of recurrent major depression currently well controlled by zoloft 100 mg. Has had a lot of stress related to several friends being diagnosed with cancer.  Her best friend has tongue cancer. Her sister in law has cancer has not been diagnosed yet- tumor in neck entangled with carotid artery. Her father in law is dying from 2 strokes in the last few days and melanoma that has metastasized. Her other best friends brother killed his wife.    Medication Review: Current Outpatient Medications on File Prior to Visit  Medication Sig Dispense Refill   aspirin 81 MG tablet Take 81 mg by mouth daily.     atenolol (TENORMIN) 25 MG tablet Take 1 tablet Daily for BP                                                           /                                        Take                                      by                                       mouth                               once daily 90 tablet 3   Cholecalciferol (VITAMIN D3) 125 MCG (5000 UT) TABS Take      1 capsule       Daily     hydrochlorothiazide (HYDRODIURIL) 25 MG tablet Take  1 tablet  Daily for BP & Fluid Retention / Ankle Swelling                                    /                                 TAKE                                  BY                           MOUTH  ONCE DAILY 90 tablet 3   MAGNESIUM PO Take by mouth.     Multiple Vitamin (MULTIVITAMIN WITH MINERALS) TABS tablet Take 1 tablet by mouth daily.     Omega-3 Fatty Acids (FISH OIL) 1000 MG CAPS Take by mouth.     rosuvastatin (CRESTOR) 20 MG tablet Take 1 tablet (20 mg total) by mouth daily. for cholesterol. 90 tablet 0   sertraline (ZOLOFT) 100 MG tablet Take 1 tablet  Daily for Mood                                                   /                                        TAKE                                                     BY                                                 MOUTH                                            ONCE DAILY 90 tablet 3   Wheat Dextrin (BENEFIBER PO) Take by mouth.     Zinc 50 MG TABS Take by mouth daily.     Docusate Calcium (STOOL SOFTENER PO) Take by mouth. (Patient not taking: Reported on 05/26/2022)     gabapentin (NEURONTIN) 100 MG capsule Take 1 capsule (100 mg total) by mouth 3 (three) times daily. 90 capsule 0   phentermine (ADIPEX-P) 37.5 MG tablet TAKE 1/2 TO 1  TABLET  DAILY IN THE MORNING FOR DIETING AND WEIGHT LOSS (Patient not taking: Reported on 05/26/2022) 90 tablet 1   No current facility-administered medications on file prior to visit.    Allergies  Allergen Reactions   Atorvastatin Nausea Only    Current Problems (verified) Patient Active Problem List   Diagnosis Date Noted   Overweight with body mass index (BMI) of 29 to 29.9 in adult 03/03/2022   COVID 03/19/2020   Osteopenia 07/12/2019   History of adenomatous polyp of colon 04/04/2019   Fatty liver 03/01/2017   Depression, major, recurrent, in complete remission 05/02/2014   Medication management 10/09/2013   Hypertension    Hyperlipidemia    Vitamin D deficiency     Screening Tests Immunization History  Administered Date(s) Administered   Covid-19, Mrna,Vaccine(Spikevax)5016yrs and older 11/24/2021   Influenza, High Dose Seasonal PF 12/27/2018, 11/23/2019, 11/24/2021   Influenza-Unspecified 11/15/2012, 12/19/2017, 12/17/2020   Moderna SARS-COV2 Booster Vaccination 11/23/2019   Moderna Sars-Covid-2 Vaccination 03/23/2019, 04/18/2019, 11/23/2019   PNEUMOCOCCAL CONJUGATE-20 05/04/2021   PPD Test 03/07/2013, 05/02/2014, 05/22/2015, 10/10/2017   Pfizer Covid-19 Vaccine Bivalent Booster 4816yrs & up 11/28/2020   Pneumococcal-Unspecified 02/15/2009   Td  02/15/2002   Tdap 03/02/2012   Zoster, Live 12/02/2015    Health Maintenance  Topic Date Due   DTaP/Tdap/Td (3 - Td or Tdap) 03/02/2022   COVID-19 Vaccine (6 - 2023-24 season) 06/11/2022 (Originally 01/19/2022)   Zoster Vaccines- Shingrix (1 of 2) 08/25/2022 (Originally 12/26/1974)   MAMMOGRAM  11/25/2022 (Originally 03/25/2022)   COLONOSCOPY (Pts 45-21yrs Insurance coverage will need to be confirmed)  05/26/2023 (Originally 04/15/2020)   INFLUENZA VACCINE  09/16/2022   Medicare Annual Wellness (AWV)  05/26/2023   Pneumonia Vaccine 3+ Years old  Completed   DEXA SCAN  Completed   Hepatitis C Screening  Completed   HPV VACCINES  Aged Out     Names of Other Physician/Practitioners you currently use: 1.  [facility] here for primary care 2. Dr. Katrina Stack, eye doctor, last visit 2021 3. Dr. Charm Barges, dentist, last visit 2022, goes q84m  Patient Care Team: Lucky Cowboy, MD as PCP - General (Internal Medicine) Hilarie Fredrickson, MD as Consulting Physician (Gastroenterology)  SURGICAL HISTORY She  has a past surgical history that includes Cesarean section (1975 and 1980); Tubal ligation; Abdominal surgery (09/2016); and I & D extremity (Right, 12/14/2018). FAMILY HISTORY Her family history includes Cancer in her father; Diabetes in her brother, brother, mother, and another family member; Heart disease in her father; Hypertension in her brother, father, and mother; Prostate cancer in her father. SOCIAL HISTORY She  reports that she quit smoking about 36 years ago. Her smoking use included cigarettes. She has never used smokeless tobacco. She reports that she does not drink alcohol and does not use drugs.   MEDICARE WELLNESS OBJECTIVES: Physical activity: Current Exercise Habits: Home exercise routine, Type of exercise: walking, Time (Minutes): 60, Frequency (Times/Week): 3, Weekly Exercise (Minutes/Week): 180, Intensity: Mild, Exercise limited by: None identified Cardiac risk factors: Cardiac Risk Factors include:  advanced age (>56men, >3 women);dyslipidemia;hypertension Depression/mood screen:      05/26/2022   12:07 PM  Depression screen PHQ 2/9  Decreased Interest 0  Down, Depressed, Hopeless 0  PHQ - 2 Score 0    ADLs:     05/26/2022   12:08 PM  In your present state of health, do you have any difficulty performing the following activities:  Hearing? 0  Vision? 0  Difficulty concentrating or making decisions? 0  Walking or climbing stairs? 0  Dressing or bathing? 0  Doing errands, shopping? 0     Cognitive Testing  Alert? Yes  Normal Appearance?Yes  Oriented to person? Yes  Place? Yes   Time? Yes  Recall of three objects?  Yes  Can perform simple calculations? Yes  Displays appropriate judgment?Yes  Can read the correct time from a watch face?Yes  EOL planning: Does Patient Have a Medical Advance Directive?: Yes Type of Advance Directive: Healthcare Power of Attorney, Living will Does patient want to make changes to medical advance directive?: No - Patient declined Copy of Healthcare Power of Attorney in Chart?: No - copy requested  Review of Systems  Constitutional:  Negative for malaise/fatigue and weight loss.       Weight gain  HENT:  Negative for hearing loss and tinnitus.   Eyes:  Negative for blurred vision and double vision.  Respiratory:  Negative for cough, sputum production, shortness of breath and wheezing.   Cardiovascular:  Negative for chest pain, palpitations, orthopnea, claudication, leg swelling and PND.  Gastrointestinal:  Negative for abdominal pain, blood in stool, constipation, diarrhea, heartburn, melena, nausea and vomiting.  Genitourinary: Negative.   Musculoskeletal:  Negative for falls, joint pain and myalgias.  Skin:  Negative for rash.  Neurological:  Negative for dizziness, tingling, sensory change, weakness and headaches.  Endo/Heme/Allergies:  Positive for environmental allergies. Negative for polydipsia.  Psychiatric/Behavioral: Negative.   Negative for depression, memory loss, substance abuse and suicidal ideas. The patient is not nervous/anxious and does not have insomnia.   All other systems reviewed and are negative.    Objective:     Today's Vitals   05/26/22 1137  BP: 130/70  Pulse: 71  Temp: (!) 97.5 F (36.4 C)  SpO2: 96%  Weight: 178 lb 3.2 oz (80.8 kg)  Height: 5\' 6"  (1.676 m)   Body mass index is 28.76 kg/m.  General appearance: alert, no distress, WD/WN, female HEENT: normocephalic, sclerae anicteric, TMs pearly, nares patent, no discharge or erythema,  Oral cavity: Mask in place; oral exam deferred Neck: supple, no lymphadenopathy, no thyromegaly, no masses Heart: RRR, normal S1, S2, no murmurs Lungs: CTA bilaterally, no wheezes, rhonchi, or rales Abdomen: +bs, soft, non tender, non distended, no masses, no hepatomegaly, no splenomegaly Musculoskeletal: nontender, no swelling, no obvious deformity Extremities: no edema, no cyanosis, no clubbing Pulses: 2+ symmetric, upper and lower extremities, normal cap refill Neurological: alert, oriented x 3, CN2-12 intact, strength normal upper extremities and lower extremities, sensation normal throughout, DTRs 2+ throughout, no cerebellar signs, gait normal Psychiatric: normal affect, behavior normal, pleasant     Medicare Attestation I have personally reviewed: The patient's medical and social history Their use of alcohol, tobacco or illicit drugs Their current medications and supplements The patient's functional ability including ADLs,fall risks, home safety risks, cognitive, and hearing and visual impairment Diet and physical activities Evidence for depression or mood disorders  The patient's weight, height, BMI, and visual acuity have been recorded in the chart.  I have made referrals, counseling, and provided education to the patient based on review of the above and I have provided the patient with a written personalized care plan for preventive  services.     Raynelle Dick, NP   05/01/2020

## 2022-05-26 ENCOUNTER — Ambulatory Visit (INDEPENDENT_AMBULATORY_CARE_PROVIDER_SITE_OTHER): Payer: Medicare Other | Admitting: Nurse Practitioner

## 2022-05-26 ENCOUNTER — Encounter: Payer: Self-pay | Admitting: Nurse Practitioner

## 2022-05-26 ENCOUNTER — Other Ambulatory Visit (HOSPITAL_COMMUNITY): Payer: Self-pay

## 2022-05-26 ENCOUNTER — Other Ambulatory Visit: Payer: Self-pay | Admitting: Nurse Practitioner

## 2022-05-26 ENCOUNTER — Telehealth: Payer: Self-pay

## 2022-05-26 VITALS — BP 130/70 | HR 71 | Temp 97.5°F | Ht 66.0 in | Wt 178.2 lb

## 2022-05-26 DIAGNOSIS — I10 Essential (primary) hypertension: Secondary | ICD-10-CM

## 2022-05-26 DIAGNOSIS — K760 Fatty (change of) liver, not elsewhere classified: Secondary | ICD-10-CM | POA: Diagnosis not present

## 2022-05-26 DIAGNOSIS — E559 Vitamin D deficiency, unspecified: Secondary | ICD-10-CM | POA: Diagnosis not present

## 2022-05-26 DIAGNOSIS — E782 Mixed hyperlipidemia: Secondary | ICD-10-CM

## 2022-05-26 DIAGNOSIS — Z0001 Encounter for general adult medical examination with abnormal findings: Secondary | ICD-10-CM

## 2022-05-26 DIAGNOSIS — F3342 Major depressive disorder, recurrent, in full remission: Secondary | ICD-10-CM

## 2022-05-26 DIAGNOSIS — E663 Overweight: Secondary | ICD-10-CM

## 2022-05-26 DIAGNOSIS — R7309 Other abnormal glucose: Secondary | ICD-10-CM | POA: Diagnosis not present

## 2022-05-26 DIAGNOSIS — Z6828 Body mass index (BMI) 28.0-28.9, adult: Secondary | ICD-10-CM

## 2022-05-26 DIAGNOSIS — Z79899 Other long term (current) drug therapy: Secondary | ICD-10-CM | POA: Diagnosis not present

## 2022-05-26 DIAGNOSIS — Z1211 Encounter for screening for malignant neoplasm of colon: Secondary | ICD-10-CM

## 2022-05-26 DIAGNOSIS — Z860101 Personal history of adenomatous and serrated colon polyps: Secondary | ICD-10-CM

## 2022-05-26 DIAGNOSIS — Z0000 Encounter for general adult medical examination without abnormal findings: Secondary | ICD-10-CM

## 2022-05-26 DIAGNOSIS — E2839 Other primary ovarian failure: Secondary | ICD-10-CM

## 2022-05-26 DIAGNOSIS — R6889 Other general symptoms and signs: Secondary | ICD-10-CM | POA: Diagnosis not present

## 2022-05-26 DIAGNOSIS — Z86010 Personal history of colonic polyps: Secondary | ICD-10-CM | POA: Diagnosis not present

## 2022-05-26 LAB — CBC WITH DIFFERENTIAL/PLATELET
Eosinophils Relative: 0.9 %
Lymphs Abs: 2229 cells/uL (ref 850–3900)
MCH: 32 pg (ref 27.0–33.0)
MCV: 93.9 fL (ref 80.0–100.0)
Neutro Abs: 3010 cells/uL (ref 1500–7800)
Neutrophils Relative %: 52.8 %
Total Lymphocyte: 39.1 %

## 2022-05-26 MED ORDER — WEGOVY 0.25 MG/0.5ML ~~LOC~~ SOAJ
0.2500 mg | SUBCUTANEOUS | 2 refills | Status: AC
Start: 2022-05-26 — End: ?
  Filled 2022-05-26: qty 2, 28d supply, fill #0

## 2022-05-26 MED ORDER — WEGOVY 0.25 MG/0.5ML ~~LOC~~ SOAJ
0.2500 mg | SUBCUTANEOUS | 2 refills | Status: DC
Start: 2022-05-26 — End: 2022-05-26

## 2022-05-26 NOTE — Telephone Encounter (Signed)
Can you see if we can try to do a prior [place]

## 2022-05-26 NOTE — Patient Instructions (Signed)

## 2022-05-26 NOTE — Telephone Encounter (Signed)
Prior auth completed and submitted. 

## 2022-05-27 LAB — COMPLETE METABOLIC PANEL WITH GFR
AG Ratio: 2 (calc) (ref 1.0–2.5)
ALT: 23 U/L (ref 6–29)
AST: 21 U/L (ref 10–35)
Albumin: 4.4 g/dL (ref 3.6–5.1)
Alkaline phosphatase (APISO): 81 U/L (ref 37–153)
BUN: 15 mg/dL (ref 7–25)
CO2: 32 mmol/L (ref 20–32)
Calcium: 9.6 mg/dL (ref 8.6–10.4)
Chloride: 101 mmol/L (ref 98–110)
Creat: 0.51 mg/dL (ref 0.50–1.05)
Globulin: 2.2 g/dL (calc) (ref 1.9–3.7)
Glucose, Bld: 84 mg/dL (ref 65–99)
Potassium: 4.4 mmol/L (ref 3.5–5.3)
Sodium: 141 mmol/L (ref 135–146)
Total Bilirubin: 0.5 mg/dL (ref 0.2–1.2)
Total Protein: 6.6 g/dL (ref 6.1–8.1)
eGFR: 102 mL/min/{1.73_m2} (ref >=60)

## 2022-05-27 LAB — CBC WITH DIFFERENTIAL/PLATELET
Absolute Monocytes: 359 cells/uL (ref 200–950)
Basophils Absolute: 51 cells/uL (ref 0–200)
Basophils Relative: 0.9 %
Eosinophils Absolute: 51 cells/uL (ref 15–500)
HCT: 43.4 % (ref 35.0–45.0)
Hemoglobin: 14.8 g/dL (ref 11.7–15.5)
MCHC: 34.1 g/dL (ref 32.0–36.0)
MPV: 10 fL (ref 7.5–12.5)
Monocytes Relative: 6.3 %
Platelets: 270 10*3/uL (ref 140–400)
RBC: 4.62 10*6/uL (ref 3.80–5.10)
RDW: 12 % (ref 11.0–15.0)
WBC: 5.7 10*3/uL (ref 3.8–10.8)

## 2022-05-27 LAB — LIPID PANEL
Cholesterol: 148 mg/dL (ref <200)
HDL: 62 mg/dL (ref >=50)
LDL Cholesterol (Calc): 64 mg/dL (calc)
Non-HDL Cholesterol (Calc): 86 mg/dL (calc) (ref <130)
Total CHOL/HDL Ratio: 2.4 (calc) (ref <5.0)
Triglycerides: 137 mg/dL (ref <150)

## 2022-05-27 LAB — TSH: TSH: 1.44 mIU/L (ref 0.40–4.50)

## 2022-06-01 ENCOUNTER — Other Ambulatory Visit: Payer: Self-pay | Admitting: Internal Medicine

## 2022-06-01 DIAGNOSIS — Z1231 Encounter for screening mammogram for malignant neoplasm of breast: Secondary | ICD-10-CM

## 2022-06-03 ENCOUNTER — Other Ambulatory Visit: Payer: Self-pay

## 2022-06-03 ENCOUNTER — Telehealth: Payer: Self-pay | Admitting: Nurse Practitioner

## 2022-06-03 NOTE — Telephone Encounter (Signed)
Patient wanted to make you aware that she is starting the Dupixant today and would like it added to her chart. Also, would like to get a status on if insurance has approved the [facility] for her?

## 2022-06-07 ENCOUNTER — Other Ambulatory Visit (HOSPITAL_COMMUNITY): Payer: Self-pay

## 2022-06-14 ENCOUNTER — Telehealth: Payer: Self-pay | Admitting: Nurse Practitioner

## 2022-06-14 NOTE — Telephone Encounter (Signed)
Pt is wanting an update on Wegovy

## 2022-06-14 NOTE — Telephone Encounter (Signed)
Spoke with patient and told her we are still waiting on a response. I sent in more paperwork on 06/08/22.

## 2022-06-15 DIAGNOSIS — Z1211 Encounter for screening for malignant neoplasm of colon: Secondary | ICD-10-CM | POA: Diagnosis not present

## 2022-06-16 ENCOUNTER — Other Ambulatory Visit: Payer: Self-pay | Admitting: Nurse Practitioner

## 2022-06-16 ENCOUNTER — Telehealth: Payer: Self-pay | Admitting: Nurse Practitioner

## 2022-06-16 DIAGNOSIS — Z6828 Body mass index (BMI) 28.0-28.9, adult: Secondary | ICD-10-CM

## 2022-06-16 MED ORDER — TOPIRAMATE 25 MG PO TABS
25.0000 mg | ORAL_TABLET | Freq: Every day | ORAL | 2 refills | Status: DC
Start: 2022-06-16 — End: 2022-08-26

## 2022-06-16 MED ORDER — PHENTERMINE HCL 37.5 MG PO TABS
ORAL_TABLET | ORAL | 0 refills | Status: DC
Start: 2022-06-16 — End: 2022-06-17

## 2022-06-16 NOTE — Telephone Encounter (Signed)
Prior auth denied and patient aware.

## 2022-06-16 NOTE — Telephone Encounter (Signed)
Phentermine says no print

## 2022-06-17 ENCOUNTER — Other Ambulatory Visit: Payer: Self-pay | Admitting: Nurse Practitioner

## 2022-06-17 DIAGNOSIS — E663 Overweight: Secondary | ICD-10-CM

## 2022-06-17 MED ORDER — PHENTERMINE HCL 37.5 MG PO TABS: ORAL_TABLET | ORAL | 0 refills | Status: DC

## 2022-06-17 MED ORDER — PHENTERMINE HCL 37.5 MG PO TABS
ORAL_TABLET | ORAL | 0 refills | Status: DC
Start: 2022-06-17 — End: 2022-08-26

## 2022-06-17 NOTE — Telephone Encounter (Signed)
I got it electronically sent over.  Thank you

## 2022-06-23 LAB — COLOGUARD: COLOGUARD: NEGATIVE

## 2022-07-07 ENCOUNTER — Ambulatory Visit: Payer: Medicare Other

## 2022-07-09 ENCOUNTER — Ambulatory Visit
Admission: RE | Admit: 2022-07-09 | Discharge: 2022-07-09 | Disposition: A | Discharge status: Home / Self Care | Payer: Medicare Other | Source: Ambulatory Visit | Attending: Internal Medicine | Admitting: Internal Medicine

## 2022-07-09 DIAGNOSIS — Z1231 Encounter for screening mammogram for malignant neoplasm of breast: Secondary | ICD-10-CM | POA: Diagnosis not present

## 2022-08-19 ENCOUNTER — Other Ambulatory Visit: Payer: Self-pay | Admitting: Nurse Practitioner

## 2022-08-26 ENCOUNTER — Encounter: Payer: Self-pay | Admitting: Internal Medicine

## 2022-08-26 ENCOUNTER — Ambulatory Visit (INDEPENDENT_AMBULATORY_CARE_PROVIDER_SITE_OTHER): Payer: Medicare Other | Admitting: Internal Medicine

## 2022-08-26 VITALS — BP 138/90 | HR 69 | Temp 98.0°F | Resp 16 | Ht 66.0 in | Wt 178.6 lb

## 2022-08-26 DIAGNOSIS — R7309 Other abnormal glucose: Secondary | ICD-10-CM | POA: Diagnosis not present

## 2022-08-26 DIAGNOSIS — E559 Vitamin D deficiency, unspecified: Secondary | ICD-10-CM

## 2022-08-26 DIAGNOSIS — E782 Mixed hyperlipidemia: Secondary | ICD-10-CM

## 2022-08-26 DIAGNOSIS — Z79899 Other long term (current) drug therapy: Secondary | ICD-10-CM | POA: Diagnosis not present

## 2022-08-26 DIAGNOSIS — I10 Essential (primary) hypertension: Secondary | ICD-10-CM

## 2022-08-26 DIAGNOSIS — E663 Overweight: Secondary | ICD-10-CM

## 2022-08-26 DIAGNOSIS — Z6828 Body mass index (BMI) 28.0-28.9, adult: Secondary | ICD-10-CM

## 2022-08-26 DIAGNOSIS — B0229 Other postherpetic nervous system involvement: Secondary | ICD-10-CM

## 2022-08-26 MED ORDER — GABAPENTIN 300 MG PO CAPS
ORAL_CAPSULE | ORAL | 1 refills | Status: DC
Start: 2022-08-26 — End: 2023-03-16

## 2022-08-26 MED ORDER — PHENTERMINE HCL 37.5 MG PO TABS
ORAL_TABLET | ORAL | 1 refills | Status: DC
Start: 2022-08-26 — End: 2023-03-14

## 2022-08-26 MED ORDER — TOPIRAMATE 50 MG PO TABS: ORAL_TABLET | ORAL | 1 refills | Status: DC

## 2022-08-26 NOTE — Patient Instructions (Signed)

## 2022-08-26 NOTE — Progress Notes (Signed)
Future Appointments  Date Time Provider Department  08/26/2022 11:30 AM Lucky Cowboy, MD [facility]  03/04/2023                            [facility] 10:00 AM Raynelle Dick, NP [facility]  06/02/2023                           [place] 11:00 AM Raynelle Dick, NP [facility]    History of Present Illness:       This very nice 67 y.o. MWF presents for 3 month follow up with HTN, HLD, Pre-Diabetes and Vitamin D Deficiency.         Patient is treated for HTN  since 2000   & BP has been controlled at home. Today's BP: (!) 138/90. Patient has had no complaints of any cardiac type chest pain, palpitations, dyspnea / orthopnea / PND, dizziness, claudication, or dependent edema.        Hyperlipidemia is controlled with diet & meds. Patient denies myalgias or other med SE's. Last Lipids were  Lab Results  Component Value Date   CHOL 148 05/26/2022   HDL 62 05/26/2022   LDLCALC 64 05/26/2022   TRIG 137 05/26/2022   CHOLHDL 2.4 05/26/2022      Also, the patient has beeen monitored for glucose intolerance and has had no symptoms of reactive hypoglycemia, diabetic polys, paresthesias or visual blurring.  Last A1c was  neqar goal :  Lab Results  Component Value Date   HGBA1C 5.7 (H) 03/03/2022                                                         Further, the patient also has history of Vitamin D Deficiency and supplements vitamin D . Last vitamin D was at goal :  Lab Results  Component Value Date   VD25OH 29 03/03/2022      Current Outpatient Medications on File Prior to Visit  Medication Sig   aspirin 81 MG tablet Take 81 mg by mouth daily.   atenolol (TENORMIN) 25 MG tablet Take 1 tablet Daily f   Cholecalciferol (VITAMIN D3) 125 MCG (5000 UT) TABS Take      1 capsule       Daily   DUPIXENT 300 MG/2ML SOPN Inject into the skin.   gabapentin (NEURONTIN) 100 MG capsule Take 1 capsule (100 mg total) by mouth 3 (three) times daily.   hydrochlorothiazide  25 MG tablet  Take  1 tablet  Daily    MAGNESIUM  Take by mouth.   Multiple Vitamin (MULTIVITAMIN WITH MINERALS) TABS tablet Take 1 tablet by mouth daily.   Omega-3 Fatty Acids (FISH OIL) 1000 MG CAPS Take by mouth.   phentermine  37.5 MG tablet Take 1/2 to 1 tablet every morning for dieting & weightloss   rosuvastatin (CRESTOR) 20 MG tablet TAKE 1 TABLET BY MOUTH ONCE DAILY FOR CHOLESTEROL   Semaglutide-WEGOVY 0.25 MG/0.5ML  Inject 0.25 mg into the skin once a week.   sertraline (ZOLOFT) 100 MG tablet Take 1 tablet  Daily    topiramate (TOPAMAX) 25 MG tablet Take 1 tablet (25 mg total) by mouth at bedtime.   Wheat Dextrin (BENEFIBER PO) Take  by mouth.   Zinc 50 MG TABS Take by mouth daily.   No current facility-administered medications on file prior to visit.      Allergies  Allergen Reactions   Atorvastatin Nausea Only      PMHx:   Past Medical History:  Diagnosis Date   Allergy    Anxiety    Climacteric    Depression    Fibrocystic breast    Hypercholesteremia    Hyperlipidemia    Hypertension    Thumb laceration, right, initial encounter    Vitamin D deficiency       Immunization History  Administered Date(s) Administered   Covid-19, Mrna,Vaccine(Spikevax)65yrs and older 11/24/2021   Influenza, High Dose Seasonal PF 12/27/2018, 11/23/2019, 11/24/2021   Influenza-Unspecified 11/15/2012, 12/19/2017, 12/17/2020   Moderna SARS-COV2 Booster Vaccination 11/23/2019   Moderna Sars-Covid-2 Vaccination 03/23/2019, 04/18/2019, 11/23/2019   PNEUMOCOCCAL CONJUGATE-20 05/04/2021   PPD Test 03/07/2013, 05/02/2014, 05/22/2015, 10/10/2017   Pfizer Covid-19 Vaccine Bivalent Booster 48yrs & up 11/28/2020   Pneumococcal-Unspecified 02/15/2009   Td 02/15/2002   Tdap 03/02/2012   Zoster, Live 12/02/2015      Past Surgical History:  Procedure Laterality Date   ABDOMINAL SURGERY  09/2016   "tummy tuck"   CESAREAN SECTION  1975 and 1980   I & D EXTREMITY Right 12/14/2018   Procedure:  Right thumb nailbed repair, distal phalanx irrigation and debridement and closed reduction with percutaneous pinning;  Surgeon: Ernest Mallick, MD;  Location: [facility];  Service: Orthopedics;  Laterality: Right;  thumb    TUBAL LIGATION       FHx:    Reviewed / unchanged   SHx:    Reviewed / unchanged    Systems Review:  Constitutional: Denies fever, chills, wt changes, headaches, insomnia, fatigue, night sweats, change in appetite. Eyes: Denies redness, blurred vision, diplopia, discharge, itchy, watery eyes.  ENT: Denies discharge, congestion, post nasal drip, epistaxis, sore throat, earache, hearing loss, dental pain, tinnitus, vertigo, sinus pain, snoring.  CV: Denies chest pain, palpitations, irregular heartbeat, syncope, dyspnea, diaphoresis, orthopnea, PND, claudication or edema. Respiratory: denies cough, dyspnea, DOE, pleurisy, hoarseness, laryngitis, wheezing.  Gastrointestinal: Denies dysphagia, odynophagia, heartburn, reflux, water brash, abdominal pain or cramps, nausea, vomiting, bloating, diarrhea, constipation, hematemesis, melena, hematochezia  or hemorrhoids. Genitourinary: Denies dysuria, frequency, urgency, nocturia, hesitancy, discharge, hematuria or flank pain. Musculoskeletal: Denies arthralgias, myalgias, stiffness, jt. swelling, pain, limping or strain/sprain.  Skin: Denies pruritus, rash, hives, warts, acne, eczema or change in skin lesion(s). Neuro: No weakness, tremor, incoordination, spasms, paresthesia or pain. Psychiatric: Denies confusion, memory loss or sensory loss. Endo: Denies change in weight, skin or hair change.  Heme/Lymph: No excessive bleeding, bruising or enlarged lymph nodes.   Physical Exam  BP (!) 138/90   Pulse 69   Temp 98 F (36.7 C)   Resp 16   Ht 5\' 6"  (1.676 m)   Wt 178 lb 9.6 oz (81 kg)   SpO2 99%   BMI 28.83 kg/m   Appears  well nourished, well groomed  and in no distress.  Eyes: PERRLA, EOMs,  conjunctiva no swelling or erythema. Sinuses: No frontal/maxillary tenderness ENT/Mouth: EAC's clear, TM's nl w/o erythema, bulging. Nares clear w/o erythema, swelling, exudates. Oropharynx clear without erythema or exudates. Oral hygiene is good. Tongue normal, non obstructing. Hearing intact.  Neck: Supple. Thyroid not palpable. Car 2+/2+ without bruits, nodes or JVD. Chest: Respirations nl with BS clear & equal w/o rales, rhonchi, wheezing or stridor.  Cor:  Heart sounds normal w/ regular rate and rhythm without sig. murmurs, gallops, clicks or rubs. Peripheral pulses normal and equal  without edema.  Abdomen: Soft & bowel sounds normal. Non-tender w/o guarding, rebound, hernias, masses or organomegaly.  Lymphatics: Unremarkable.  Musculoskeletal: Full ROM all peripheral extremities, joint stability, 5/5 strength and normal gait.  Skin: Warm, dry without exposed rashes, lesions or ecchymosis apparent.  Neuro: Cranial nerves intact, reflexes equal bilaterally. Sensory-motor testing grossly intact. Tendon reflexes grossly intact.  Pysch: Alert & oriented x 3.  Insight and judgement nl & appropriate. No ideations.   Assessment and Plan:  - Continue medication, monitor blood pressure at home.  - Continue DASH diet.  Reminder to go to the ER if any CP,  SOB, nausea, dizziness, severe HA, changes vision/speech.  - Continue diet/meds, exercise,& lifestyle modifications.  - Continue monitor periodic cholesterol/liver & renal functions   1. Essential hypertension  - CBC with Differential/Platelet - COMPLETE METABOLIC PANEL WITH GFR - Magnesium - TSH  2. Hyperlipidemia, mixed  - Lipid panel - TSH  3. Abnormal glucose  - Hemoglobin A1C w/out eAG - Insulin, random  4. Vitamin D deficiency  - VITAMIN D 25 Hydroxy   5. Medication management  - CBC with Differential/Platelet - COMPLETE METABOLIC PANEL WITH GFR - Magnesium - Lipid panel - TSH - Hemoglobin A1C w/out eAG -  Insulin, random - VITAMIN D 25 Hydroxy    - Continue diet, exercise  - Lifestyle modifications.  - Monitor appropriate labs. - Continue supplementation.        Discussed  regular exercise, BP monitoring, weight control to achieve/maintain BMI less than 25 and discussed med and SE's. Recommended labs to assess /monitor clinical status .  I discussed the assessment and treatment plan with the patient. The patient was provided an opportunity to ask questions and all were answered. The patient agreed with the plan and demonstrated an understanding of the instructions.  I provided over 30 minutes of exam, counseling, chart review and  complex critical decision making.        The patient was advised to call back or seek an in-person evaluation if the symptoms worsen or if the condition fails to improve as anticipated.   Marinus Maw, MD

## 2022-08-27 LAB — COMPLETE METABOLIC PANEL WITH GFR
AG Ratio: 2.1 (calc) (ref 1.0–2.5)
ALT: 27 U/L (ref 6–29)
AST: 21 U/L (ref 10–35)
Albumin: 4.5 g/dL (ref 3.6–5.1)
Alkaline phosphatase (APISO): 105 U/L (ref 37–153)
BUN: 19 mg/dL (ref 7–25)
CO2: 31 mmol/L (ref 20–32)
Calcium: 9.7 mg/dL (ref 8.6–10.4)
Chloride: 104 mmol/L (ref 98–110)
Creat: 0.59 mg/dL (ref 0.50–1.05)
Globulin: 2.1 g/dL (calc) (ref 1.9–3.7)
Glucose, Bld: 87 mg/dL (ref 65–99)
Potassium: 4.2 mmol/L (ref 3.5–5.3)
Sodium: 141 mmol/L (ref 135–146)
Total Bilirubin: 0.5 mg/dL (ref 0.2–1.2)
Total Protein: 6.6 g/dL (ref 6.1–8.1)
eGFR: 98 mL/min/{1.73_m2} (ref >=60)

## 2022-08-27 LAB — VITAMIN D 25 HYDROXY (VIT D DEFICIENCY, FRACTURES): Vit D, 25-Hydroxy: 79 ng/mL (ref 30–100)

## 2022-08-27 LAB — CBC WITH DIFFERENTIAL/PLATELET
Absolute Monocytes: 280 cells/uL (ref 200–950)
Basophils Absolute: 40 cells/uL (ref 0–200)
Basophils Relative: 0.8 %
Eosinophils Absolute: 40 cells/uL (ref 15–500)
Eosinophils Relative: 0.8 %
HCT: 42.1 % (ref 35.0–45.0)
Hemoglobin: 14.4 g/dL (ref 11.7–15.5)
Lymphs Abs: 1790 cells/uL (ref 850–3900)
MCH: 32.4 pg (ref 27.0–33.0)
MCHC: 34.2 g/dL (ref 32.0–36.0)
MCV: 94.6 fL (ref 80.0–100.0)
MPV: 10.4 fL (ref 7.5–12.5)
Monocytes Relative: 5.6 %
Neutro Abs: 2850 cells/uL (ref 1500–7800)
Neutrophils Relative %: 57 %
Platelets: 234 10*3/uL (ref 140–400)
RBC: 4.45 10*6/uL (ref 3.80–5.10)
RDW: 12.1 % (ref 11.0–15.0)
Total Lymphocyte: 35.8 %
WBC: 5 10*3/uL (ref 3.8–10.8)

## 2022-08-27 LAB — LIPID PANEL
Cholesterol: 142 mg/dL (ref <200)
HDL: 66 mg/dL (ref >=50)
LDL Cholesterol (Calc): 52 mg/dL (calc)
Non-HDL Cholesterol (Calc): 76 mg/dL (calc) (ref <130)
Total CHOL/HDL Ratio: 2.2 (calc) (ref <5.0)
Triglycerides: 160 mg/dL — ABNORMAL HIGH (ref <150)

## 2022-08-27 LAB — INSULIN, RANDOM: Insulin: 28.1 u[IU]/mL — ABNORMAL HIGH

## 2022-08-27 LAB — MAGNESIUM: Magnesium: 2.3 mg/dL (ref 1.5–2.5)

## 2022-08-27 LAB — TSH: TSH: 1.6 mIU/L (ref 0.40–4.50)

## 2022-08-27 LAB — HEMOGLOBIN A1C W/OUT EAG: Hgb A1c MFr Bld: 5.4 % of total Hgb (ref <5.7)

## 2022-08-28 NOTE — Progress Notes (Signed)
^<^<^<^<^<^<^<^<^<^<^<^<^<^<^<^<^<^<^<^<^<^<^<^<^<^<^<^<^<^<^<^<^<^<^<^<^ ^>^>^>^>^>^>^>^>^>^>^>>^>^>^>^>^>^>^>^>^>^>^>^>^>^>^>^>^>^>^>^>^>^>^>^>^>  -  Test results slightly outside the reference range are not unusual. If there is anything important, I will review this with you,  otherwise it is considered normal test values.  If you have further questions,  please do not hesitate to contact me at the office or via My Chart.   ^<^<^<^<^<^<^<^<^<^<^<^<^<^<^<^<^<^<^<^<^<^<^<^<^<^<^<^<^<^<^<^<^<^<^<^<^ ^>^>^>^>^>^>^>^>^>^>^>^>^>^>^>^>^>^>^>^>^>^>^>^>^>^>^>^>^>^>^>^>^>^>^>^>^  -  Chol = 142  -  Excellent   - Very low risk for Heart Attack  / Stroke  ^>^>^>^>^>^>^>^>^>^>^>^>^>^>^>^>^>^>^>^>^>^>^>^>^>^>^>^>^>^>^>^>^>^>^>^>^ ^>^>^>^>^>^>^>^>^>^>^>^>^>^>^>^>^>^>^>^>^>^>^>^>^>^>^>^>^>^>^>^>^>^>^>^>^  -  A1c - Normal - No Diabetes  - Great !   ^>^>^>^>^>^>^>^>^>^>^>^>^>^>^>^>^>^>^>^>^>^>^>^>^>^>^>^>^>^>^>^>^>^>^>^>^  -  Vitamin D = 79 - Excellent  - Please keep dosage same   ^>^>^>^>^>^>^>^>^>^>^>^>^>^>^>^>^>^>^>^>^>^>^>^>^>^>^>^>^>^>^>^>^>^>^>^>^  - All Else - CBC - Kidneys - Electrolytes - Liver - Magnesium & Thyroid    - all  Normal / OK  ^>^>^>^>^>^>^>^>^>^>^>^>^>^>^>^>^>^>^>^>^>^>^>^>^>^>^>^>^>^>^>^>^>^>^>^>^

## 2022-09-30 DIAGNOSIS — Z08 Encounter for follow-up examination after completed treatment for malignant neoplasm: Secondary | ICD-10-CM | POA: Diagnosis not present

## 2022-09-30 DIAGNOSIS — L821 Other seborrheic keratosis: Secondary | ICD-10-CM | POA: Diagnosis not present

## 2022-09-30 DIAGNOSIS — Z85828 Personal history of other malignant neoplasm of skin: Secondary | ICD-10-CM | POA: Diagnosis not present

## 2022-09-30 DIAGNOSIS — Z789 Other specified health status: Secondary | ICD-10-CM | POA: Diagnosis not present

## 2022-09-30 DIAGNOSIS — L814 Other melanin hyperpigmentation: Secondary | ICD-10-CM | POA: Diagnosis not present

## 2022-09-30 DIAGNOSIS — L538 Other specified erythematous conditions: Secondary | ICD-10-CM | POA: Diagnosis not present

## 2022-09-30 DIAGNOSIS — D2371 Other benign neoplasm of skin of right lower limb, including hip: Secondary | ICD-10-CM | POA: Diagnosis not present

## 2022-09-30 DIAGNOSIS — D225 Melanocytic nevi of trunk: Secondary | ICD-10-CM | POA: Diagnosis not present

## 2022-09-30 DIAGNOSIS — R208 Other disturbances of skin sensation: Secondary | ICD-10-CM | POA: Diagnosis not present

## 2022-10-05 ENCOUNTER — Other Ambulatory Visit: Payer: Self-pay | Admitting: Internal Medicine

## 2022-10-19 DIAGNOSIS — R070 Pain in throat: Secondary | ICD-10-CM | POA: Diagnosis not present

## 2022-10-19 DIAGNOSIS — R509 Fever, unspecified: Secondary | ICD-10-CM | POA: Diagnosis not present

## 2022-10-19 DIAGNOSIS — R0981 Nasal congestion: Secondary | ICD-10-CM | POA: Diagnosis not present

## 2022-10-19 DIAGNOSIS — R051 Acute cough: Secondary | ICD-10-CM | POA: Diagnosis not present

## 2022-10-20 DIAGNOSIS — R0981 Nasal congestion: Secondary | ICD-10-CM | POA: Diagnosis not present

## 2022-10-20 DIAGNOSIS — R051 Acute cough: Secondary | ICD-10-CM | POA: Diagnosis not present

## 2022-10-20 DIAGNOSIS — R519 Headache, unspecified: Secondary | ICD-10-CM | POA: Diagnosis not present

## 2022-10-20 DIAGNOSIS — R509 Fever, unspecified: Secondary | ICD-10-CM | POA: Diagnosis not present

## 2022-11-19 ENCOUNTER — Other Ambulatory Visit: Payer: Self-pay | Admitting: Nurse Practitioner

## 2022-11-26 ENCOUNTER — Ambulatory Visit: Payer: Medicare Other | Admitting: Nurse Practitioner

## 2022-12-01 ENCOUNTER — Ambulatory Visit (INDEPENDENT_AMBULATORY_CARE_PROVIDER_SITE_OTHER): Payer: Medicare Other | Admitting: Internal Medicine

## 2022-12-01 ENCOUNTER — Encounter: Payer: Self-pay | Admitting: Internal Medicine

## 2022-12-01 VITALS — BP 124/72 | HR 75 | Temp 97.5°F | Resp 16 | Ht 66.0 in | Wt 161.2 lb

## 2022-12-01 DIAGNOSIS — I10 Essential (primary) hypertension: Secondary | ICD-10-CM

## 2022-12-01 DIAGNOSIS — E782 Mixed hyperlipidemia: Secondary | ICD-10-CM | POA: Diagnosis not present

## 2022-12-01 DIAGNOSIS — Z79899 Other long term (current) drug therapy: Secondary | ICD-10-CM | POA: Diagnosis not present

## 2022-12-01 DIAGNOSIS — R7309 Other abnormal glucose: Secondary | ICD-10-CM

## 2022-12-01 DIAGNOSIS — Z6828 Body mass index (BMI) 28.0-28.9, adult: Secondary | ICD-10-CM

## 2022-12-01 DIAGNOSIS — E559 Vitamin D deficiency, unspecified: Secondary | ICD-10-CM

## 2022-12-01 DIAGNOSIS — Z23 Encounter for immunization: Secondary | ICD-10-CM

## 2022-12-01 DIAGNOSIS — E663 Overweight: Secondary | ICD-10-CM

## 2022-12-01 DIAGNOSIS — Z6829 Body mass index (BMI) 29.0-29.9, adult: Secondary | ICD-10-CM

## 2022-12-01 NOTE — Progress Notes (Signed)
Future Appointments  Date Time Provider Department  12/01/2022                  9 mo ov 10:30 AM Lucky Cowboy, MD [facility]  03/04/2023                    cpe 11:00 AM Lucky Cowboy, MD [facility]  06/02/2023                   wellness 11:00 AM Raynelle Dick, NP [facility]    History of Present Illness:       This very nice 67 y.o. MWF  presents for 9 month follow up with HTN, HLD, Pre-Diabetes and Vitamin D Deficiency.         Patient is treated for HTN (2000)  & BP has been controlled at home. Today's BP is at goal -  124/72. Patient has had no complaints of any cardiac type chest pain, palpitations, dyspnea Pollyann Kennedy /PND, dizziness, claudication  or dependent edema.        Hyperlipidemia is controlled with diet & meds. Patient denies myalgias or other med SE's. Last Lipids were at goal :  Lab Results  Component Value Date   CHOL 142 08/26/2022   HDL 66 08/26/2022   LDLCALC 52 08/26/2022   TRIG 160 (H) 08/26/2022   CHOLHDL 2.2 08/26/2022     Also, the patient is monitored for glucose intolerance and has had no symptoms of reactive hypoglycemia, diabetic polys, paresthesias or visual blurring.  Last A1c was at goal :  Lab Results  Component Value Date   HGBA1C 5.4 08/26/2022                                                          Further, the patient also has history of Vitamin D Deficiency and supplements vitamin D . Last vitamin D was at goal :  Lab Results  Component Value Date   VD25OH 79 08/26/2022     Current Outpatient Medications on File Prior to Visit  Medication Sig   aspirin 81 MG tablet Take  daily.   atenolol 25 MG tablet Take 1 tablet Daily              VITAMIN D 5000 u Take      1 capsule       Daily   gabapentin  300 MG capsule Take  1 capsule    3 x / day   Hctz  25 MG tablet Take 1 tablet  daily   MAGNESIUM  Take daily    Multiple Vitamin  Take 1 tablet daily.   Omega-3 FISH OIL 1000 MG  Take    Phentermine  37.5 MG  tablet Take  1/2 to 1 tablet  every Morning    rosuvastatin 20 MG tablet TAKE 1 TABLET  DAILY    Semaglutide-Weight Management (WEGOVY) 0.25 MG/0.5ML SOAJ Inject 0.25 mg into the skin once a week. (Patient not taking: Reported on 08/26/2022)   sertraline 100 MG tablet Take 1 tablet  Daily   topiramate 50 MG tablet Take  1/2 to 1 tablet  2 x /day  at Supper time &  Bedtime    Wheat Dextrin Take    daily   Zinc 50 MG TABS  Take  daily.     Allergies  Allergen Reactions   Atorvastatin Nausea Only     PMHx:   Past Medical History:  Diagnosis Date   Allergy    Anxiety    Climacteric    Depression    Fibrocystic breast    Hypercholesteremia    Hyperlipidemia    Hypertension    Thumb laceration, right, initial encounter    Vitamin D deficiency      Immunization History  Administered Date(s) Administered   Influenza, High Dose 12/27/2018, 11/23/2019, 11/24/2021   Influenza 11/15/2012, 12/19/2017, 12/17/2020   Moderna Covid-19 Fall Seasonal Vacc 11/24/2021   Moderna SARS-COV2 Vacc 11/23/2019   Moderna Sars-Covid-2 Vacc 03/23/2019, 04/18/2019, 11/23/2019   PNEUMOCOCCAL CONJUGATE-20 05/04/2021   PPD Test 05/22/2015, 10/10/2017   Pfizer Covid-19 Vaccine Bivalent Booster  11/28/2020   Pneumococcal - 23  02/15/2009   Td 02/15/2002   Tdap 03/02/2012   Zoster, Live 12/02/2015     Past Surgical History:  Procedure Laterality Date   ABDOMINAL SURGERY  09/2016   "tummy tuck"   CESAREAN SECTION  1975 and 1980   I & D EXTREMITY Right 12/14/2018   Procedure: Right thumb nailbed repair, distal phalanx irrigation and debridement and closed reduction with percutaneous pinning;  Surgeon: Ernest Mallick, MD;  Location: [facility];  Service: Orthopedics;  Laterality: Right;  thumb    TUBAL LIGATION       FHx:    Reviewed / unchanged   SHx:    Reviewed / unchanged    Systems Review:  Constitutional: Denies fever, chills, wt changes, headaches, insomnia,  fatigue, night sweats, change in appetite. Eyes: Denies redness, blurred vision, diplopia, discharge, itchy, watery eyes.  ENT: Denies discharge, congestion, post nasal drip, epistaxis, sore throat, earache, hearing loss, dental pain, tinnitus, vertigo, sinus pain, snoring.  CV: Denies chest pain, palpitations, irregular heartbeat, syncope, dyspnea, diaphoresis, orthopnea, PND, claudication or edema. Respiratory: denies cough, dyspnea, DOE, pleurisy, hoarseness, laryngitis, wheezing.  Gastrointestinal: Denies dysphagia, odynophagia, heartburn, reflux, water brash, abdominal pain or cramps, nausea, vomiting, bloating, diarrhea, constipation, hematemesis, melena, hematochezia  or hemorrhoids. Genitourinary: Denies dysuria, frequency, urgency, nocturia, hesitancy, discharge, hematuria or flank pain. Musculoskeletal: Denies arthralgias, myalgias, stiffness, jt. swelling, pain, limping or strain/sprain.  Skin: Denies pruritus, rash, hives, warts, acne, eczema or change in skin lesion(s). Neuro: No weakness, tremor, incoordination, spasms, paresthesia or pain. Psychiatric: Denies confusion, memory loss or sensory loss. Endo: Denies change in weight, skin or hair change.  Heme/Lymph: No excessive bleeding, bruising or enlarged lymph nodes.   Physical Exam  BP 124/72   Pulse 75   Temp (!) 97.5 F (36.4 C)   Resp 16   Ht 5\' 6"  (1.676 m)   Wt 161 lb 3.2 oz (73.1 kg)   SpO2 98%   BMI 26.02 kg/m   Appears  overnourished, well groomed  and in no distress.  Eyes: PERRLA, EOMs, conjunctiva no swelling or erythema. Sinuses: No frontal/maxillary tenderness ENT/Mouth: EAC's clear, TM's nl w/o erythema, bulging. Nares clear w/o erythema, swelling, exudates. Oropharynx clear without erythema or exudates. Oral hygiene is good. Tongue normal, non obstructing. Hearing intact.  Neck: Supple. Thyroid not palpable. Car 2+/2+ without bruits, nodes or JVD. Chest: Respirations nl with BS clear & equal w/o  rales, rhonchi, wheezing or stridor.  Cor: Heart sounds normal w/ regular rate and rhythm without sig. murmurs, gallops, clicks or rubs. Peripheral pulses normal and equal  without edema.  Abdomen: Soft & bowel  sounds normal. Non-tender w/o guarding, rebound, hernias, masses or organomegaly.  Lymphatics: Unremarkable.  Musculoskeletal: Full ROM all peripheral extremities, joint stability, 5/5 strength and normal gait.  Skin: Warm, dry without exposed rashes, lesions or ecchymosis apparent.  Neuro: Cranial nerves intact, reflexes equal bilaterally. Sensory-motor testing grossly intact. Tendon reflexes grossly intact.  Pysch: Alert & oriented x 3.  Insight and judgement nl & appropriate. No ideations.   Assessment and Plan:   1. Essential hypertension  - Continue medication, monitor blood pressure at home.  - Continue DASH diet.  Reminder to go to the ER if any CP,  SOB, nausea, dizziness, severe HA, changes vision/speech.     - CBC with Differential/Platelet - COMPLETE METABOLIC PANEL WITH GFR - Magnesium - TSH   2. Hyperlipidemia, mixed  - Continue diet/meds, exercise,& lifestyle modifications.  - Continue monitor periodic cholesterol/liver & renal functions     - Lipid panel - TSH   3. Abnormal glucose  - Continue diet, exercise  - Lifestyle modifications.  - Monitor appropriate labs    - Hemoglobin A1c - Insulin, random  4. Vitamin D deficiency  - Continue supplementation   - VITAMIN D 25 Hydroxy    5. Overweight with body mass index (BMI) of 29 to 29.9 in adult  - TSH   6. Medication management  - CBC with Differential/Platelet - COMPLETE METABOLIC PANEL WITH GFR - Magnesium - Lipid panel - TSH - Hemoglobin A1c - Insulin, random - VITAMIN D 25 Hydroxy   7. Need for varicella vaccine  - Varicella zoster antibody, IgG          Discussed  regular exercise, BP monitoring, weight control to achieve/maintain BMI less than 25 and discussed med and  SE's. Recommended labs to assess /monitor clinical status .  I discussed the assessment and treatment plan with the patient. The patient was provided an opportunity to ask questions and all were answered. The patient agreed with the plan and demonstrated an understanding of the instructions.  I provided over 30 minutes of exam, counseling, chart review and  complex critical decision making.        The patient was advised to call back or seek an in-person evaluation if the symptoms worsen or if the condition fails to improve as anticipated.   Marinus Maw, MD

## 2022-12-01 NOTE — Patient Instructions (Signed)
Due to recent changes in healthcare laws, you may see the results of your imaging and laboratory studies on MyChart before your provider has had a chance to review them.  We understand that in some cases there may be results that are confusing or concerning to you. Not all laboratory results come back in the same time frame and the provider may be waiting for multiple results in order to interpret others.  Please give [place] 48 hours in order for your provider to thoroughly review all the results before contacting the office for clarification of your results.  ++++++++++++++++++++++++++  Vit D  & Vit C 1,000 mg   are recommended to help protect  against the Covid-19 and other Corona viruses.    Also it's recommended  to take  Zinc 50 mg  to help  protect against the Covid-19   and best place to get  is also on [employer].[email or web address]  and don't pay more than 6-8 cents /pill !   +++++++++++++++++++++++++++++++++++++++ Recommend Adult Low Dose Aspirin or  coated  Aspirin 81 mg daily  To reduce risk of Colon Cancer 40 %,  Skin Cancer 26 % ,  Melanoma 46%  and  Pancreatic cancer 60% +++++++++++++++++++++++++++++++++++++++++ Vitamin D goal  is between 70-100.  Please make sure that you are taking your Vitamin D as directed.  It is very important as a natural anti-inflammatory  helping hair, skin, and nails, as well as reducing stroke and heart attack risk.  It helps your bones and helps with mood. It also decreases numerous cancer risks so please take it as directed.  Low Vit D is associated with a 200-300% higher risk for CANCER  and 200-300% higher risk for HEART   ATTACK  &  STROKE.   .....................................Marland Kitchen It is also associated with higher death rate at younger ages,  autoimmune diseases like Rheumatoid arthritis, Lupus, Multiple Sclerosis.    Also many other serious conditions, like depression, Alzheimer's Dementia, infertility, muscle aches, fatigue, fibromyalgia - just to name  a few. +++++++++++++++++++++++++++++++++++++++++ Recommend the book "The END of DIETING" by Dr Monico Hoar  & the book "The END of DIABETES " by Dr Monico Hoar At [email or web address] - get book & Audio CD's    Being diabetic has a  300% increased risk for heart attack, stroke, cancer, and alzheimer- type vascular dementia. It is very important that you work harder with diet by avoiding all foods that are white. Avoid white rice (brown & wild rice is OK), white potatoes (sweetpotatoes in moderation is OK), White bread or wheat bread or anything made out of white flour like bagels, donuts, rolls, buns, biscuits, cakes, pastries, cookies, pizza crust, and pasta (made from white flour & egg whites) - vegetarian pasta or spinach or wheat pasta is OK. Multigrain breads like Arnold's or [employer], or multigrain sandwich thins or flatbreads.  Diet, exercise and weight loss can reverse and cure diabetes in the early stages.  Diet, exercise and weight loss is very important in the control and prevention of complications of diabetes which affects every system in your body, ie. Brain - dementia/stroke, eyes - glaucoma/blindness, heart - heart attack/heart failure, kidneys - dialysis, stomach - gastric paralysis, intestines - malabsorption, nerves - severe painful neuritis, circulation - gangrene & loss of a leg(s), and finally cancer and Alzheimers.    I recommend avoid fried & greasy foods,  sweets/candy, white rice (brown or wild rice or Quinoa is OK), white potatoes (sweet potatoes are OK) - anything  made from white flour - bagels, doughnuts, rolls, buns, biscuits,white and wheat breads, pizza crust and traditional pasta made of white flour & egg white(vegetarian pasta or spinach or wheat pasta is OK).  Multi-grain bread is OK - like multi-grain flat bread or sandwich thins. Avoid alcohol in excess. Exercise is also important.    Eat all the vegetables you want - avoid meat, especially red meat and dairy - especially  cheese.  Cheese is the most concentrated form of trans-fats which is the worst thing to clog up our arteries. Veggie cheese is OK which can be found in the fresh produce section at [employer] or [employer] or [employer]  +++++++++++++++++++++++++++++++++++++++ DASH Eating Plan  DASH stands for "Dietary Approaches to Stop Hypertension."   The DASH eating plan is a healthy eating plan that has been shown to reduce high blood pressure (hypertension). Additional health benefits may include reducing the risk of type 2 diabetes mellitus, heart disease, and stroke. The DASH eating plan may also help with weight loss. WHAT DO I NEED TO KNOW ABOUT THE DASH EATING PLAN? For the DASH eating plan, you will follow these general guidelines: Choose foods with a percent daily value for sodium of less than 5% (as listed on the food label). Use salt-free seasonings or herbs instead of table salt or sea salt. Check with your health care provider or pharmacist before using salt substitutes. Eat lower-sodium products, often labeled as "lower sodium" or "no salt added." Eat fresh foods. Eat more vegetables, fruits, and low-fat dairy products. Choose whole grains. Look for the word "whole" as the first word in the ingredient list. Choose fish  Limit sweets, desserts, sugars, and sugary drinks. Choose heart-healthy fats. Eat veggie cheese  Eat more home-cooked food and less restaurant, buffet, and fast food. Limit fried foods. Cook foods using methods other than frying. Limit canned vegetables. If you do use them, rinse them well to decrease the sodium. When eating at a restaurant, ask that your food be prepared with less salt, or no salt if possible.                      WHAT FOODS CAN I EAT? Read Dr Francis Dowse Fuhrman's books on The End of Dieting & The End of Diabetes  Grains Whole grain or whole wheat bread. Brown rice. Whole grain or whole wheat pasta. Quinoa, bulgur, and whole grain cereals. Low-sodium  cereals. Corn or whole wheat flour tortillas. Whole grain cornbread. Whole grain crackers. Low-sodium crackers.  Vegetables Fresh or frozen vegetables (raw, steamed, roasted, or grilled). Low-sodium or reduced-sodium tomato and vegetable juices. Low-sodium or reduced-sodium tomato sauce and paste. Low-sodium or reduced-sodium canned vegetables.   Fruits All fresh, canned (in natural juice), or frozen fruits.  Protein Products  All fish and seafood.  Dried beans, peas, or lentils. Unsalted nuts and seeds. Unsalted canned beans.  Dairy Low-fat dairy products, such as skim or 1% milk, 2% or reduced-fat cheeses, low-fat ricotta or cottage cheese, or plain low-fat yogurt. Low-sodium or reduced-sodium cheeses.  Fats and Oils Tub margarines without trans fats. Light or reduced-fat mayonnaise and salad dressings (reduced sodium). Avocado. Safflower, olive, or canola oils. Natural peanut or almond butter.  Other Unsalted popcorn and pretzels. The items listed above may not be a complete list of recommended foods or beverages. Contact your dietitian for more options.  +++++++++++++++  WHAT FOODS ARE NOT RECOMMENDED? Grains/ White flour or wheat flour White bread. White pasta. White rice. Refined  cornbread. Bagels and croissants. Crackers that contain trans fat.  Vegetables  Creamed or fried vegetables. Vegetables in a . Regular canned vegetables. Regular canned tomato sauce and paste. Regular tomato and vegetable juices.  Fruits Dried fruits. Canned fruit in light or heavy syrup. Fruit juice.  Meat and Other Protein Products Meat in general - RED meat & White meat.  Fatty cuts of meat. Ribs, chicken wings, all processed meats as bacon, sausage, bologna, salami, fatback, hot dogs, bratwurst and packaged luncheon meats.  Dairy Whole or 2% milk, cream, half-and-half, and cream cheese. Whole-fat or sweetened yogurt. Full-fat cheeses or blue cheese. Non-dairy creamers and whipped toppings.  Processed cheese, cheese spreads, or cheese curds.  Condiments Onion and garlic salt, seasoned salt, table salt, and sea salt. Canned and packaged gravies. Worcestershire sauce. Tartar sauce. Barbecue sauce. Teriyaki sauce. Soy sauce, including reduced sodium. Steak sauce. Fish sauce. Oyster sauce. Cocktail sauce. Horseradish. Ketchup and mustard. Meat flavorings and tenderizers. Bouillon cubes. Hot sauce. Tabasco sauce. Marinades. Taco seasonings. Relishes.  Fats and Oils Butter, stick margarine, lard, shortening and bacon fat. Coconut, palm kernel, or palm oils. Regular salad dressings.  Pickles and olives. Salted popcorn and pretzels.  The items listed above may not be a complete list of foods and beverages to avoid.

## 2022-12-02 LAB — CBC WITH DIFFERENTIAL/PLATELET
Absolute Lymphocytes: 2054 {cells}/uL (ref 850–3900)
Absolute Monocytes: 432 {cells}/uL (ref 200–950)
Basophils Absolute: 38 {cells}/uL (ref 0–200)
Basophils Relative: 0.8 %
Eosinophils Absolute: 38 {cells}/uL (ref 15–500)
Eosinophils Relative: 0.8 %
HCT: 41.3 % (ref 35.0–45.0)
Hemoglobin: 14 g/dL (ref 11.7–15.5)
MCH: 32.6 pg (ref 27.0–33.0)
MCHC: 33.9 g/dL (ref 32.0–36.0)
MCV: 96 fL (ref 80.0–100.0)
MPV: 10.2 fL (ref 7.5–12.5)
Monocytes Relative: 9 %
Neutro Abs: 2237 {cells}/uL (ref 1500–7800)
Neutrophils Relative %: 46.6 %
Platelets: 228 10*3/uL (ref 140–400)
RBC: 4.3 10*6/uL (ref 3.80–5.10)
RDW: 12.5 % (ref 11.0–15.0)
Total Lymphocyte: 42.8 %
WBC: 4.8 10*3/uL (ref 3.8–10.8)

## 2022-12-02 LAB — HEMOGLOBIN A1C
Hgb A1c MFr Bld: 5.4 %{Hb} (ref <5.7)
Mean Plasma Glucose: 108 mg/dL
eAG (mmol/L): 6 mmol/L

## 2022-12-02 LAB — LIPID PANEL
Cholesterol: 135 mg/dL (ref <200)
HDL: 60 mg/dL (ref >=50)
LDL Cholesterol (Calc): 56 mg/dL
Non-HDL Cholesterol (Calc): 75 mg/dL (ref <130)
Total CHOL/HDL Ratio: 2.3 (calc) (ref <5.0)
Triglycerides: 104 mg/dL (ref <150)

## 2022-12-02 LAB — VITAMIN D 25 HYDROXY (VIT D DEFICIENCY, FRACTURES): Vit D, 25-Hydroxy: 70 ng/mL (ref 30–100)

## 2022-12-02 LAB — COMPLETE METABOLIC PANEL WITH GFR
AG Ratio: 2.1 (calc) (ref 1.0–2.5)
ALT: 28 U/L (ref 6–29)
AST: 26 U/L (ref 10–35)
Albumin: 4.4 g/dL (ref 3.6–5.1)
Alkaline phosphatase (APISO): 76 U/L (ref 37–153)
BUN: 21 mg/dL (ref 7–25)
CO2: 30 mmol/L (ref 20–32)
Calcium: 9.4 mg/dL (ref 8.6–10.4)
Chloride: 101 mmol/L (ref 98–110)
Creat: 0.69 mg/dL (ref 0.50–1.05)
Globulin: 2.1 g/dL (ref 1.9–3.7)
Glucose, Bld: 95 mg/dL (ref 65–99)
Potassium: 4 mmol/L (ref 3.5–5.3)
Sodium: 139 mmol/L (ref 135–146)
Total Bilirubin: 0.6 mg/dL (ref 0.2–1.2)
Total Protein: 6.5 g/dL (ref 6.1–8.1)
eGFR: 94 mL/min/{1.73_m2} (ref >=60)

## 2022-12-02 LAB — TSH: TSH: 2.44 m[IU]/L (ref 0.40–4.50)

## 2022-12-02 LAB — MAGNESIUM: Magnesium: 2.3 mg/dL (ref 1.5–2.5)

## 2022-12-02 LAB — VARICELLA ZOSTER ANTIBODY, IGG: Varicella IgG: 45.7 {s_co_ratio}

## 2022-12-02 LAB — INSULIN, RANDOM: Insulin: 13.3 u[IU]/mL

## 2022-12-02 NOTE — Progress Notes (Signed)
<>*<>*<>*<>*<>*<>*<>*<>*<>*<>*<>*<>*<>*<>*<>*<>*<>*<>*<>*<>*<>*<>*<>*<>*<> <>*<>*<>*<>*<>*<>*<>*<>*<>*<>*<>*<>*<>*<>*<>*<>*<>*<>*<>*<>*<>*<>*<>*<>*<>  -   Shingles Antibody returned very high at 45 level of antibody  &   Over "1.0"   is considered "safe"  - so 45 x / over protection &                                                                      do not need to repeat vaccine  <>*<>*<>*<>*<>*<>*<>*<>*<>*<>*<>*<>*<>*<>*<>*<>*<>*<>*<>*<>*<>*<>*<>*<>*<> <>*<>*<>*<>*<>*<>*<>*<>*<>*<>*<>*<>*<>*<>*<>*<>*<>*<>*<>*<>*<>*<>*<>*<>*<>  -  Chol = 135 - Wonderful   Excellent   - Very low risk for Heart Attack  / Stroke  <>*<>*<>*<>*<>*<>*<>*<>*<>*<>*<>*<>*<>*<>*<>*<>*<>*<>*<>*<>*<>*<>*<>*<>*<> <>*<>*<>*<>*<>*<>*<>*<>*<>*<>*<>*<>*<>*<>*<>*<>*<>*<>*<>*<>*<>*<>*<>*<>*<>  -  A1c - Normal - No Diabetes - Great  !  <>*<>*<>*<>*<>*<>*<>*<>*<>*<>*<>*<>*<>*<>*<>*<>*<>*<>*<>*<>*<>*<>*<>*<>*<> <>*<>*<>*<>*<>*<>*<>*<>*<>*<>*<>*<>*<>*<>*<>*<>*<>*<>*<>*<>*<>*<>*<>*<>*<>  -  Vitamin D = 70 - Also Excellent - Please  keep dose same   <>*<>*<>*<>*<>*<>*<>*<>*<>*<>*<>*<>*<>*<>*<>*<>*<>*<>*<>*<>*<>*<>*<>*<>*<> <>*<>*<>*<>*<>*<>*<>*<>*<>*<>*<>*<>*<>*<>*<>*<>*<>*<>*<>*<>*<>*<>*<>*<>*<>  -  All Else - CBC - Kidneys - Electrolytes - Liver - Magnesium & Thyroid    - all  Normal / OK  <>*<>*<>*<>*<>*<>*<>*<>*<>*<>*<>*<>*<>*<>*<>*<>*<>*<>*<>*<>*<>*<>*<>*<>*<> <>*<>*<>*<>*<>*<>*<>*<>*<>*<>*<>*<>*<>*<>*<>*<>*<>*<>*<>*<>*<>*<>*<>*<>*<>  -  Keep up the [place] Work  !  <>*<>*<>*<>*<>*<>*<>*<>*<>*<>*<>*<>*<>*<>*<>*<>*<>*<>*<>*<>*<>*<>*<>*<>*<> <>*<>*<>*<>*<>*<>*<>*<>*<>*<>*<>*<>*<>*<>*<>*<>*<>*<>*<>*<>*<>*<>*<>*<>*<>

## 2022-12-30 ENCOUNTER — Other Ambulatory Visit: Payer: Self-pay | Admitting: Internal Medicine

## 2022-12-30 ENCOUNTER — Other Ambulatory Visit: Payer: Self-pay | Admitting: Nurse Practitioner

## 2022-12-30 DIAGNOSIS — F3342 Major depressive disorder, recurrent, in full remission: Secondary | ICD-10-CM

## 2022-12-30 DIAGNOSIS — R001 Bradycardia, unspecified: Secondary | ICD-10-CM

## 2022-12-30 DIAGNOSIS — I10 Essential (primary) hypertension: Secondary | ICD-10-CM

## 2023-02-19 ENCOUNTER — Other Ambulatory Visit: Payer: Self-pay | Admitting: Nurse Practitioner

## 2023-03-03 NOTE — Progress Notes (Unsigned)
[address]      ADULT   &   ADOLESCENT      INTERNAL MEDICINE  Ruth Ward, M.D.          Rance Muir, ANP        Adela Glimpse, FNP  [facility] [address]  [place], [place]. [address] Telephone [phone] Telefax [phone]  Future Appointments  Date Time Provider Department  03/04/2023                 cpe 11:00 AM Ruth Cowboy, MD [facility]  06/02/2023                wellness 11:00 AM Raynelle Dick, NP [facility]  03/15/2024                 cpe 11:00 AM Ruth Cowboy, MD [facility]    Annual Screening/Preventative Visit & Comprehensive Evaluation &  Examination      This very nice 68 y.o. MWF with  HTN, HLD, glucose intolerance  and Vitamin D Deficiency  presents for a Screening /Preventative Visit & comprehensive evaluation and management of multiple medical co-morbidities.          HTN predates circa 2000. Patient's BP has been controlled at home and patient denies any cardiac symptoms as chest pain, palpitations, shortness of breath, dizziness or ankle swelling. Today's BP is at goal -                             .       Patient's hyperlipidemia is controlled with diet and Rosuvastatin. Patient denies myalgias or other medication SE's. Last lipids were at goal:  Lab Results  Component Value Date   CHOL 135 12/01/2022   HDL 60 12/01/2022   LDLCALC 56 12/01/2022   TRIG 104 12/01/2022   CHOLHDL 2.3 12/01/2022         Patient has mild obesity (BMI 28+) and is expectantly monitored for glucose intolerance  and patient denies reactive hypoglycemic symptoms, visual blurring, diabetic polys or paresthesias.  Note patient has gained 24# over the last 6 months. Last A1c was normal & at goal:  Lab Results  Component Value Date   HGBA1C 5.4 12/01/2022        Finally, patient has history of Vitamin D Deficiency ("43" /2008) and last Vitamin D was at goal:  Lab Results  Component Value Date   VD25OH 70 12/01/2022        Current Outpatient Medications  Medication Instructions   atenolol  25 MG tablet Take 1 tablet  daily for blood pressure   VITAMIN D 5000 u Take      1 capsule       Daily   gabapentin (NEURONTIN) 300 MG capsule Take  1 capsule    3 x / day   for Neuropathy Pain                                                                             hydrochlorothiazide 25 MG tablet Take 1 tablet  daily   loratadine 10 MG tablet    MAGNESIUM    Multiple Vitamin  1 tablet Daily   Omega-3 FISH OIL 1000 MG CAPS    phentermine 37.5 MG tablet Take  1/2 to 1 tablet  every Morning   Rosuvastatin 20 mg Daily   sertraline 100 MG tablet TAKE 1 TABLET DAILY FOR MOOD   topiramate  50 MG tablet Take  1/2-1 tab 2 x /day-Supper  &  Bedtime   Wegovy 0.25 mg  Subcut  Weekly   BENEFIBER    Zinc 50 MG TABS Daily    Allergies  Allergen Reactions   Atorvastatin Nausea Only     Past Medical History:  Diagnosis Date   Allergy    Anxiety    Climacteric    Depression    Fibrocystic breast    Hypercholesteremia    Hyperlipidemia    Hypertension    Thumb laceration, right, initial encounter    Vitamin D deficiency    Health Maintenance  Topic Date Due   PNA vac Low Risk Adult (1 of 2 - PCV13) 12/27/2018   INFLUENZA VACCINE  09/16/2019   MAMMOGRAM  03/01/2020   COLONOSCOPY  04/15/2020   TETANUS/TDAP  03/02/2022   DEXA SCAN  Completed   COVID-19 Vaccine  Completed   Hepatitis C Screening  Completed   Immunization History  Administered Date(s) Administered   Influenza, High Dose Seasonal PF 12/27/2018   Influenza-Unspecified 11/15/2012, 12/19/2017   Moderna SARS-COVID-2 Vaccination 03/23/2019, 04/18/2019   PPD Test 03/07/2013, 05/02/2014, 05/22/2015, 10/10/2017   Pneumococcal-Unspecified 02/15/2009   Td 02/15/2002   Tdap 03/02/2012   Zoster 12/02/2015    Last Colon -  04/15/2017 - Dr Russella Dar recc 3 yr f/u  overdue Mar 2022  Last MGM  - 05/29//2024  Last dexa BMD - 07/12/2019  Past Surgical  History:  Procedure Laterality Date   ABDOMINAL SURGERY  09/2016   "tummy tuck"   CESAREAN SECTION  1975 and 1980   I & D EXTREMITY Right 12/14/2018   Procedure: Right thumb nailbed repair, distal phalanx irrigation and debridement and closed reduction with percutaneous pinning;  Surgeon: Ernest Mallick, MD;  Location: [facility];  Service: Orthopedics;  Laterality: Right;  thumb    TUBAL LIGATION     Family History  Problem Relation Age of Onset   Diabetes Other    Hypertension Mother    Diabetes Mother    Hypertension Father    Heart disease Father    Cancer Father    Prostate cancer Father    Hypertension Brother    Diabetes Brother    Diabetes Brother    Colon cancer Neg Hx    Social History   Tobacco Use   Smoking status: Former Smoker    Quit date: 02/15/1986    Years since quitting: 33.9   Smokeless tobacco: Never Used  Vaping Use   Vaping Use: Never used  Substance Use Topics   Alcohol use: No   Drug use: No    ROS Constitutional: Denies fever, chills, weight loss/gain, headaches, insomnia,  night sweats, and change in appetite. Does c/o fatigue. Eyes: Denies redness, blurred vision, diplopia, discharge, itchy, watery eyes.  ENT: Denies discharge, congestion, post nasal drip, epistaxis, sore throat, earache, hearing loss, dental pain, Tinnitus, Vertigo, Sinus pain, snoring.  Cardio: Denies chest pain, palpitations, irregular heartbeat, syncope, dyspnea, diaphoresis, orthopnea, PND, claudication, edema Respiratory: denies cough, dyspnea, DOE, pleurisy, hoarseness, laryngitis, wheezing.  Gastrointestinal: Denies dysphagia, heartburn, reflux, water brash, pain, cramps, nausea, vomiting, bloating, diarrhea, constipation, hematemesis, melena, hematochezia, jaundice, hemorrhoids Genitourinary:  Denies dysuria, frequency, urgency, nocturia, hesitancy, discharge, hematuria, flank pain Breast: Breast lumps, nipple discharge, bleeding.  Musculoskeletal:  Denies arthralgia, myalgia, stiffness, Jt. Swelling, pain, limp, and strain/sprain. Denies falls. Skin: Denies puritis, rash, hives, warts, acne, eczema, changing in skin lesion Neuro: No weakness, tremor, incoordination, spasms, paresthesia, pain Psychiatric: Denies confusion, memory loss, sensory loss. Denies Depression. Endocrine: Denies change in weight, skin, hair change, nocturia, and paresthesia, diabetic polys, visual blurring, hyper / hypo glycemic episodes.  Heme/Lymph: No excessive bleeding, bruising, enlarged lymph nodes.  Physical Exam  There were no vitals taken for this visit.  General Appearance: Well nourished, well groomed and in no apparent distress. (+) congested cough.  Eyes: PERRLA, EOMs, conjunctiva no swelling or erythema, normal fundi and vessels. Sinuses:  (+) frontal/maxillary tenderness ENT/Mouth: EACs patent / TMs  nl. Nares clear without erythema, swelling, mucoid exudates. Oral hygiene is good. No erythema, swelling, or exudate. Tongue normal, non-obstructing. Tonsils not swollen or erythematous. Hearing normal.  Neck: Supple, thyroid not palpable. No bruits, nodes or JVD. Respiratory: Respiratory effort normal.  BS equal and clear bilateral with few scattered rales &  Rhonchi clearing with cough and no wheezing or stridor. Cardio: Heart sounds are normal with regular rate and rhythm and no murmurs, rubs or gallops. Peripheral pulses are normal and equal bilaterally without edema. No aortic or femoral bruits. Chest: symmetric with normal excursions and percussion. Breasts: Symmetric, without lumps, nipple discharge, retractions, or fibrocystic changes.  Abdomen: Flat, soft with bowel sounds active. Nontender, no guarding, rebound, hernias, masses, or organomegaly.  Lymphatics: Non tender without lymphadenopathy.  Genitourinary:  Musculoskeletal: Full ROM all peripheral extremities, joint stability, 5/5 strength, and normal gait. Skin: Warm and dry without  rashes, lesions, cyanosis, clubbing or  ecchymosis.  Neuro: Cranial nerves intact, reflexes equal bilaterally. Normal muscle tone, no cerebellar symptoms. Sensation intact.  Pysch: Alert and oriented X 3, normal affect, Insight and Judgment appropriate.   Assessment and Plan  1. Annual Preventative Screening Examination   2. Essential hypertension  - EKG 12-Lead - Urinalysis, Routine w reflex microscopic - Microalbumin / creatinine urine ratio - CBC with Differential/Platelet - COMPLETE METABOLIC PANEL WITH GFR - Magnesium - TSH  3. Hyperlipidemia, mixed  - EKG 12-Lead - Lipid panel - TSH  4. Abnormal glucose  - EKG 12-Lead - Hemoglobin A1c - Insulin, random  5. Vitamin D deficiency  - VITAMIN D 25 Hydroxy   6. Screening for colorectal cancer   7. Screening for ischemic heart disease  - EKG 12-Lead  8. FHx: heart disease  - EKG 12-Lead  9. Former smoker  - EKG 12-Lead  10. Medication management  - Urinalysis, Routine w reflex microscopic - Microalbumin / creatinine urine ratio - CBC with Differential/Platelet - COMPLETE METABOLIC PANEL WITH GFR - Magnesium - Lipid panel - TSH - Hemoglobin A1c - Insulin, random - VITAMIN D 25 Hydroxy         Patient was counseled in prudent diet to achieve/maintain BMI less than 25 for weight control, BP monitoring, regular exercise and medications. Discussed med's effects and SE's. Screening labs and tests as requested with regular follow-up as recommended. Over 40 minutes of exam, counseling, chart review and high complex critical decision making was performed.   Marinus Maw, MD

## 2023-03-03 NOTE — Patient Instructions (Signed)

## 2023-03-04 ENCOUNTER — Encounter: Payer: Medicare Other | Admitting: Nurse Practitioner

## 2023-03-04 ENCOUNTER — Ambulatory Visit: Payer: Medicare Other | Admitting: Internal Medicine

## 2023-03-04 DIAGNOSIS — R7309 Other abnormal glucose: Secondary | ICD-10-CM

## 2023-03-04 DIAGNOSIS — Z136 Encounter for screening for cardiovascular disorders: Secondary | ICD-10-CM

## 2023-03-04 DIAGNOSIS — Z8249 Family history of ischemic heart disease and other diseases of the circulatory system: Secondary | ICD-10-CM

## 2023-03-04 DIAGNOSIS — I10 Essential (primary) hypertension: Secondary | ICD-10-CM

## 2023-03-04 DIAGNOSIS — E782 Mixed hyperlipidemia: Secondary | ICD-10-CM

## 2023-03-04 DIAGNOSIS — Z79899 Other long term (current) drug therapy: Secondary | ICD-10-CM

## 2023-03-04 DIAGNOSIS — Z87891 Personal history of nicotine dependence: Secondary | ICD-10-CM

## 2023-03-04 DIAGNOSIS — E559 Vitamin D deficiency, unspecified: Secondary | ICD-10-CM

## 2023-03-04 DIAGNOSIS — Z1211 Encounter for screening for malignant neoplasm of colon: Secondary | ICD-10-CM

## 2023-03-04 DIAGNOSIS — Z0001 Encounter for general adult medical examination with abnormal findings: Secondary | ICD-10-CM

## 2023-03-13 ENCOUNTER — Other Ambulatory Visit: Payer: Self-pay | Admitting: Internal Medicine

## 2023-03-13 DIAGNOSIS — E663 Overweight: Secondary | ICD-10-CM

## 2023-03-13 DIAGNOSIS — Z6828 Body mass index (BMI) 28.0-28.9, adult: Secondary | ICD-10-CM

## 2023-03-15 DIAGNOSIS — Z961 Presence of intraocular lens: Secondary | ICD-10-CM | POA: Diagnosis not present

## 2023-03-15 DIAGNOSIS — H43812 Vitreous degeneration, left eye: Secondary | ICD-10-CM | POA: Diagnosis not present

## 2023-03-15 DIAGNOSIS — H26491 Other secondary cataract, right eye: Secondary | ICD-10-CM | POA: Diagnosis not present

## 2023-03-16 ENCOUNTER — Other Ambulatory Visit: Payer: Self-pay | Admitting: Internal Medicine

## 2023-03-16 DIAGNOSIS — B0229 Other postherpetic nervous system involvement: Secondary | ICD-10-CM

## 2023-03-28 ENCOUNTER — Encounter: Payer: Medicare Other | Admitting: Internal Medicine

## 2023-03-29 ENCOUNTER — Encounter: Payer: Self-pay | Admitting: *Deleted

## 2023-04-01 ENCOUNTER — Other Ambulatory Visit: Payer: Self-pay

## 2023-04-01 DIAGNOSIS — J019 Acute sinusitis, unspecified: Secondary | ICD-10-CM | POA: Diagnosis not present

## 2023-04-01 DIAGNOSIS — F3342 Major depressive disorder, recurrent, in full remission: Secondary | ICD-10-CM

## 2023-04-01 MED ORDER — SERTRALINE HCL 100 MG PO TABS: ORAL_TABLET | ORAL | 0 refills | Status: DC

## 2023-04-05 ENCOUNTER — Other Ambulatory Visit: Payer: Self-pay

## 2023-04-05 DIAGNOSIS — D2372 Other benign neoplasm of skin of left lower limb, including hip: Secondary | ICD-10-CM | POA: Diagnosis not present

## 2023-04-05 DIAGNOSIS — R208 Other disturbances of skin sensation: Secondary | ICD-10-CM | POA: Diagnosis not present

## 2023-04-05 DIAGNOSIS — Z85828 Personal history of other malignant neoplasm of skin: Secondary | ICD-10-CM | POA: Diagnosis not present

## 2023-04-05 DIAGNOSIS — L821 Other seborrheic keratosis: Secondary | ICD-10-CM | POA: Diagnosis not present

## 2023-04-05 DIAGNOSIS — C44319 Basal cell carcinoma of skin of other parts of face: Secondary | ICD-10-CM | POA: Diagnosis not present

## 2023-04-05 DIAGNOSIS — D225 Melanocytic nevi of trunk: Secondary | ICD-10-CM | POA: Diagnosis not present

## 2023-04-05 DIAGNOSIS — L538 Other specified erythematous conditions: Secondary | ICD-10-CM | POA: Diagnosis not present

## 2023-04-05 DIAGNOSIS — Z08 Encounter for follow-up examination after completed treatment for malignant neoplasm: Secondary | ICD-10-CM | POA: Diagnosis not present

## 2023-04-05 DIAGNOSIS — L814 Other melanin hyperpigmentation: Secondary | ICD-10-CM | POA: Diagnosis not present

## 2023-04-05 DIAGNOSIS — R001 Bradycardia, unspecified: Secondary | ICD-10-CM

## 2023-04-05 DIAGNOSIS — I10 Essential (primary) hypertension: Secondary | ICD-10-CM

## 2023-04-05 DIAGNOSIS — D485 Neoplasm of uncertain behavior of skin: Secondary | ICD-10-CM | POA: Diagnosis not present

## 2023-04-05 DIAGNOSIS — Z789 Other specified health status: Secondary | ICD-10-CM | POA: Diagnosis not present

## 2023-04-05 MED ORDER — ATENOLOL 25 MG PO TABS: ORAL_TABLET | ORAL | 0 refills | Status: DC

## 2023-04-05 MED ORDER — HYDROCHLOROTHIAZIDE 25 MG PO TABS: ORAL_TABLET | ORAL | 0 refills | Status: DC

## 2023-04-07 DIAGNOSIS — L270 Generalized skin eruption due to drugs and medicaments taken internally: Secondary | ICD-10-CM | POA: Diagnosis not present

## 2023-04-21 ENCOUNTER — Encounter: Payer: Self-pay | Admitting: *Deleted

## 2023-05-30 ENCOUNTER — Ambulatory Visit (INDEPENDENT_AMBULATORY_CARE_PROVIDER_SITE_OTHER): Payer: Medicare Other | Admitting: Family Medicine

## 2023-05-30 ENCOUNTER — Encounter: Payer: Self-pay | Admitting: Family Medicine

## 2023-05-30 VITALS — BP 123/74 | HR 66 | Ht 66.0 in | Wt 156.1 lb

## 2023-05-30 DIAGNOSIS — I10 Essential (primary) hypertension: Secondary | ICD-10-CM | POA: Diagnosis not present

## 2023-05-30 DIAGNOSIS — E785 Hyperlipidemia, unspecified: Secondary | ICD-10-CM

## 2023-05-30 DIAGNOSIS — Z7689 Persons encountering health services in other specified circumstances: Secondary | ICD-10-CM

## 2023-05-30 DIAGNOSIS — F3342 Major depressive disorder, recurrent, in full remission: Secondary | ICD-10-CM | POA: Diagnosis not present

## 2023-05-30 DIAGNOSIS — D127 Benign neoplasm of rectosigmoid junction: Secondary | ICD-10-CM | POA: Insufficient documentation

## 2023-05-30 DIAGNOSIS — K760 Fatty (change of) liver, not elsewhere classified: Secondary | ICD-10-CM | POA: Diagnosis not present

## 2023-05-30 DIAGNOSIS — B0222 Postherpetic trigeminal neuralgia: Secondary | ICD-10-CM | POA: Insufficient documentation

## 2023-05-30 MED ORDER — GABAPENTIN 100 MG PO CAPS: 100.0000 mg | ORAL_CAPSULE | Freq: Two times a day (BID) | ORAL | 1 refills | Status: DC

## 2023-05-30 NOTE — Assessment & Plan Note (Signed)
 Taking Crestor 20 mg.  Most recently her cholesterol was normal.  Will recheck at next visit.

## 2023-05-30 NOTE — Assessment & Plan Note (Signed)
 Most recent liver function testing was normal.  Continue weight management

## 2023-05-30 NOTE — Assessment & Plan Note (Signed)
 Continuing atenolol and hydrochlorothiazide

## 2023-05-30 NOTE — Assessment & Plan Note (Signed)
Continue sertraline 100 mg 

## 2023-05-30 NOTE — Patient Instructions (Addendum)
 It was nice to see you today,  We addressed the following topics today: -I am sending in gabapentin 100 mg for you to take in the morning and at lunch as needed to help with the postherpetic neuralgia.  You can increase this up to 200 and 200 before seeing me again. If insurance does not pay for it it should be cheap if you use good Rx coupon. - I would recommend getting the shingles vaccine.  I would also recommend getting the Tdap which covers tetanus diphtheria and pertussis.  And I would recommend getting yearly flu and COVID vaccines.  Other than that looks like you are up-to-date on all your vaccines so COVID and flu should be done when she needs from now on - If you want a referral to neurology for possible Botox or other interventions for your postherpetic neuralgia let us  know. - I am sending in referral to the colonoscopy.  Make sure that your insurance will cover it before you get it done since you just had a Cologuard within the past year.  Have a great day,  Etha Henle, MD

## 2023-05-30 NOTE — Progress Notes (Signed)
 New Patient Office Visit  Subjective   Patient ID: Ruth Ward, female    DOB: [birth year]  Age: 68 y.o. MRN: [id number]  CC:  Chief Complaint  Patient presents with   New Patient (Initial Visit)    HPI Ruth Ward presents to establish care Dr. Oneta Rack patient.  Patient takes Topamax, Wegovy and phentermine for weight loss.  Pays for the [facility] out-of-pocket.  Takes "20 cc" of the 0.25 mg dosing but does not take it every week.  Patient had an episode of shingles outbreak last year on the right V1 distribution.  Has continued postherpetic neuralgia and takes 300 to 900 mg gabapentin at night.  Topicals have not helped.   PMH: Hypertension, MDD, fatty liver disease, HLD, allergies  PSH: left cheek - basal cell carcinoma removal . Dr. Melida Quitter.  [facility].  Left leg SCC excision.  Mini face lift.    FH: father - prostate cancer. 2x brother - dm.    Tobacco use: quit 30 years ago.  Alcohol use: none in 18 years.   Drug use: no Marital status: married 50 years.  Daughter, son.  4 grandchildren.   Employment: Retail buyer for 40 years.   Screenings:  Colon Cancer: due.  Patient had a Cologuard last year but she has had previous polyps found to be adenomas and therefore would need colonoscopy over Cologuard.    Outpatient Encounter Medications as of 05/30/2023  Medication Sig   atenolol (TENORMIN) 25 MG tablet Take 1 tablet by mouth once daily for blood pressure   Cholecalciferol (VITAMIN D3) 125 MCG (5000 UT) TABS Take      1 capsule       Daily   FLUZONE HIGH-DOSE 0.5 ML injection    gabapentin (NEURONTIN) 100 MG capsule Take 1 capsule (100 mg total) by mouth 2 (two) times daily with breakfast and lunch.   gabapentin (NEURONTIN) 300 MG capsule TAKE 1 CAPSULE BY MOUTH THREE TIMES DAILY FOR NEUROPATHY PAIN   hydrochlorothiazide (HYDRODIURIL) 25 MG tablet Take 1 tablet by mouth once daily   MAGNESIUM PO Take by mouth.   Multiple Vitamin (MULTIVITAMIN WITH  MINERALS) TABS tablet Take 1 tablet by mouth daily.   Omega-3 Fatty Acids (FISH OIL) 1000 MG CAPS Take by mouth.   phentermine (ADIPEX-P) 37.5 MG tablet TAKE 1/2 TO 1 TABLET BY MOUTH IN THE MORNING FOR DIETING AND WEIGHT LOSS   rosuvastatin (CRESTOR) 20 MG tablet TAKE 1 TABLET BY MOUTH ONCE DAILY FOR CHOLESTEROL   Semaglutide-Weight Management (WEGOVY) 0.25 MG/0.5ML SOAJ Inject 0.25 mg into the skin once a week.   sertraline (ZOLOFT) 100 MG tablet TAKE 1 TABLET BY MOUTH ONCE DAILY FOR MOOD   topiramate (TOPAMAX) 50 MG tablet TAKE 1/2 TO 1 TABLET BY MOUTH TWICE DAILY AT SUPPERTIME AND BEDTIME FOR DIETING AND WEIGHT LOSS   Wheat Dextrin (BENEFIBER PO) Take by mouth.   Zinc 50 MG TABS Take by mouth daily.   [DISCONTINUED] loratadine (CLARITIN) 10 MG tablet Take by mouth.   No facility-administered encounter medications on file as of 05/30/2023.    Past Medical History:  Diagnosis Date   Allergy    Anxiety    Climacteric    Depression    Fibrocystic breast    Hypercholesteremia    Hyperlipidemia    Hypertension    Thumb laceration, right, initial encounter    Vitamin D deficiency     Past Surgical History:  Procedure Laterality Date   ABDOMINAL SURGERY  09/2016   "  tummy tuck"   CESAREAN SECTION  1975 and 1980   I & D EXTREMITY Right 12/14/2018   Procedure: Right thumb nailbed repair, distal phalanx irrigation and debridement and closed reduction with percutaneous pinning;  Surgeon: Ernest Mallick, MD;  Location: [facility];  Service: Orthopedics;  Laterality: Right;  thumb    TUBAL LIGATION      Family History  Problem Relation Age of Onset   Diabetes Other    Hypertension Mother    Diabetes Mother    Hypertension Father    Heart disease Father    Cancer Father    Prostate cancer Father    Hypertension Brother    Diabetes Brother    Diabetes Brother    Colon cancer Neg Hx     Social History   Socioeconomic History   Marital status: Married     Spouse name: Not on file   Number of children: Not on file   Years of education: Not on file   Highest education level: Not on file  Occupational History   Not on file  Tobacco Use   Smoking status: Former    Current packs/day: 0.00    Types: Cigarettes    Quit date: 02/15/1986    Years since quitting: 37.3   Smokeless tobacco: Never  Vaping Use   Vaping status: Never Used  Substance and Sexual Activity   Alcohol use: No   Drug use: No   Sexual activity: Not on file  Other Topics Concern   Not on file  Social History Narrative   Not on file   Social Drivers of Health   Financial Resource Strain: Not on file  Food Insecurity: Not on file  Transportation Needs: Not on file  Physical Activity: Not on file  Stress: Not on file  Social Connections: Not on file  Intimate Partner Violence: Not on file    ROS     Objective   BP 123/74   Pulse 66   Ht 5\' 6"  (1.676 m)   Wt 156 lb 1.9 oz (70.8 kg)   SpO2 100%   BMI 25.20 kg/m   Physical Exam General: Alert, oriented HEENT: PERRLA, EOMI, moist mucosa CV: Rate rate rhythm Pulmonary: Lungs clear bilaterally GI: Soft, normal bowel sounds MSK: Strength of bilateral Tremors: No pedal edema Psych: Pleasant affect Skin warm and dry     Assessment & Plan:   Encounter to establish care  Depression, major, recurrent, in complete remission (HCC) Assessment & Plan: Continue sertraline 100 mg   Fatty liver Assessment & Plan: Most recent liver function testing was normal.  Continue weight management   Hyperlipidemia, unspecified hyperlipidemia type Assessment & Plan: Taking Crestor 20 mg.  Most recently her cholesterol was normal.  Will recheck at next visit.   Primary hypertension Assessment & Plan: Continuing atenolol and hydrochlorothiazide   Trigeminal neuralgia, postherpetic Assessment & Plan: Right sided V1 distribution.  Still with significant pain more so in the morning but she manages with  gabapentin 300-900 mg in the evening.  Does not take doses in the morning or afternoon because of concern for drowsiness.  Discussed other options.  Would avoid an SNRI for now while she is taking an SSRI.  Would avoid topical capsaicin due to the proximity to her eye.  Offered neurology referral for possible Botox or other procedural intervention.  Will send in 100 mg gabapentin to use at breakfast and lunch.  Can titrate up to 200/200 if needed in  addition to her nighttime dosing.   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  Was hesitant to get a repeat so she had a Cologuard last year.  We discussed how in the presence of known precancerous polyps the recommendation is colonoscopy over Cologuard.  Patient agreeable to colonoscopy.  Advised her to find out from her insurance company if they cover it given her recent Cologuard.  If they do she is to reach out to her gastroenterologist   Other orders -     Gabapentin; Take 1 capsule (100 mg total) by mouth 2 (two) times daily with breakfast and lunch.  Dispense: 180 capsule; Refill: 1    Return in about 3 months (around 08/29/2023) for Fasting labs a week before.   Laneta Pintos, MD

## 2023-05-30 NOTE — Assessment & Plan Note (Signed)
 Patient had colonoscopy approximately 5 years ago.  Was hesitant to get a repeat so she had a Cologuard last year.  We discussed how in the presence of known precancerous polyps the recommendation is colonoscopy over Cologuard.  Patient agreeable to colonoscopy.  Advised her to find out from her insurance company if they cover it given her recent Cologuard.  If they do she is to reach out to her gastroenterologist

## 2023-05-30 NOTE — Assessment & Plan Note (Addendum)
 Right sided V1 distribution.  Still with significant pain more so in the morning but she manages with gabapentin 300-900 mg in the evening.  Does not take doses in the morning or afternoon because of concern for drowsiness.  Discussed other options.  Would avoid an SNRI for now while she is taking an SSRI.  Would avoid topical capsaicin due to the proximity to her eye.  Offered neurology referral for possible Botox or other procedural intervention.  Will send in 100 mg gabapentin to use at breakfast and lunch.  Can titrate up to 200/200 if needed in addition to her nighttime dosing.

## 2023-05-31 ENCOUNTER — Other Ambulatory Visit: Payer: Self-pay | Admitting: Family Medicine

## 2023-05-31 MED ORDER — ROSUVASTATIN CALCIUM 20 MG PO TABS: 20.0000 mg | ORAL_TABLET | Freq: Every day | ORAL | 2 refills | Status: DC

## 2023-05-31 NOTE — Telephone Encounter (Signed)
 Copied from CRM [id number]. Topic: Clinical - Medication Refill >> May 31, 2023 10:27 AM Emylou G wrote: Most Recent Primary Care Visit:  Provider: Laneta Pintos  Department: [facility]  Visit Type: NEW PT - OFFICE VISIT  Date: 05/30/2023  Medication: rosuvastatin (CRESTOR) 20 MG tablet Looking for 90 day supply  Has the patient contacted their pharmacy? Yes (Agent: If no, request that the patient contact the pharmacy for the refill. If patient does not wish to contact the pharmacy document the reason why and proceed with request.) (Agent: If yes, when and what did the pharmacy advise?) said to call us   Is this the correct pharmacy for this prescription? Yes If no, delete pharmacy and type the correct one.  This is the patient's preferred pharmacy:  [facility] [address] ([address]), [place] - [address] [place] ([place]) [place] [address] Phone: [phone] Fax: [phone]     Has the prescription been filled recently? No  Is the patient out of the medication? Yes.. dozen  Has the patient been seen for an appointment in the last year OR does the patient have an upcoming appointment? Yes  Can we respond through MyChart? No  Agent: Please be advised that Rx refills may take up to 3 business days. We ask that you follow-up with your pharmacy.

## 2023-06-02 ENCOUNTER — Ambulatory Visit: Payer: Medicare Other | Admitting: Nurse Practitioner

## 2023-06-24 ENCOUNTER — Telehealth: Payer: Self-pay | Admitting: Family Medicine

## 2023-06-24 DIAGNOSIS — F3342 Major depressive disorder, recurrent, in full remission: Secondary | ICD-10-CM

## 2023-06-24 MED ORDER — SERTRALINE HCL 100 MG PO TABS: ORAL_TABLET | ORAL | 0 refills | Status: DC

## 2023-06-24 NOTE — Telephone Encounter (Signed)
 Copied from CRM [id number]. Topic: Clinical - Medication Refill >> Jun 24, 2023 10:18 AM Elle L wrote: Medication: sertraline  (ZOLOFT ) 100 MG tablet  Has the patient contacted their pharmacy? Yes  This is the patient's preferred pharmacy:  [facility] [address] ([address] [place] - [address] [place] ([place]) [place] [address] Phone: [phone] Fax: [phone]  Is this the correct pharmacy for this prescription? Yes  Has the prescription been filled recently? No  Is the patient out of the medication? Yes  Has the patient been seen for an appointment in the last year OR does the patient have an upcoming appointment? Yes  Can we respond through MyChart? No  Agent: Please be advised that Rx refills may take up to 3 business days. We ask that you follow-up with your pharmacy.

## 2023-06-29 ENCOUNTER — Other Ambulatory Visit: Payer: Self-pay | Admitting: Family

## 2023-06-29 DIAGNOSIS — I10 Essential (primary) hypertension: Secondary | ICD-10-CM

## 2023-06-29 DIAGNOSIS — R001 Bradycardia, unspecified: Secondary | ICD-10-CM

## 2023-07-06 ENCOUNTER — Other Ambulatory Visit: Payer: Self-pay | Admitting: Family Medicine

## 2023-07-06 DIAGNOSIS — R001 Bradycardia, unspecified: Secondary | ICD-10-CM

## 2023-07-06 DIAGNOSIS — Z6828 Body mass index (BMI) 28.0-28.9, adult: Secondary | ICD-10-CM

## 2023-07-06 DIAGNOSIS — I10 Essential (primary) hypertension: Secondary | ICD-10-CM

## 2023-07-06 MED ORDER — PHENTERMINE HCL 37.5 MG PO TABS: ORAL_TABLET | ORAL | 0 refills | Status: DC

## 2023-07-06 MED ORDER — TOPIRAMATE 50 MG PO TABS: ORAL_TABLET | ORAL | 0 refills | Status: DC

## 2023-07-06 MED ORDER — HYDROCHLOROTHIAZIDE 25 MG PO TABS: ORAL_TABLET | ORAL | 0 refills | Status: DC

## 2023-07-06 MED ORDER — ATENOLOL 25 MG PO TABS: ORAL_TABLET | ORAL | 0 refills | Status: DC

## 2023-07-06 NOTE — Addendum Note (Signed)
 Addended by: Laneta Pintos on: 07/06/2023 12:22 PM   Modules accepted: Orders

## 2023-07-06 NOTE — Telephone Encounter (Signed)
 Copied from CRM [id number]. Topic: Clinical - Medication Refill >> Jul 06, 2023 10:04 AM Alysia Jumbo S wrote: Medication:  atenolol  (TENORMIN ) 25 MG tablet hydrochlorothiazide  (HYDRODIURIL ) 25 MG tablet phentermine  (ADIPEX-P ) 37.5 MG tablet topiramate  (TOPAMAX ) 50 MG tablet  Has the patient contacted their pharmacy? Yes (Agent: If no, request that the patient contact the pharmacy for the refill. If patient does not wish to contact the pharmacy document the reason why and proceed with request.) (Agent: If yes, when and what did the pharmacy advise?)  This is the patient's preferred pharmacy:  [facility] [address] ([address]), [place] - [address] [place] ([place]) [place] [address] Phone: [phone] Fax: [phone]  Is this the correct pharmacy for this prescription? Yes If no, delete pharmacy and type the correct one.   Has the prescription been filled recently? No  Is the patient out of the medication? Yes  Has the patient been seen for an appointment in the last year OR does the patient have an upcoming appointment? Yes  Can we respond through MyChart? No  Agent: Please be advised that Rx refills may take up to 3 business days. We ask that you follow-up with your pharmacy.

## 2023-07-08 ENCOUNTER — Telehealth: Payer: Self-pay

## 2023-07-08 DIAGNOSIS — E663 Overweight: Secondary | ICD-10-CM

## 2023-07-08 NOTE — Telephone Encounter (Signed)
 Copied from CRM [id number]. Topic: Clinical - Prescription Issue >> Jul 08, 2023  8:41 AM Adonis Hoot wrote: Reason for CRM: [facility] pharmacy called stating that her BMI of 25 is out of the range of 27 ,what is required for this patient to be prescribed medication phentermine  (ADIPEX-P ) 37.5 MG tablet, looks like she no longer needs this medication ,so they are unable to fill medication.

## 2023-07-12 ENCOUNTER — Other Ambulatory Visit: Payer: Self-pay | Admitting: Family Medicine

## 2023-07-12 ENCOUNTER — Encounter: Payer: Self-pay | Admitting: Pediatrics

## 2023-07-12 DIAGNOSIS — Z1231 Encounter for screening mammogram for malignant neoplasm of breast: Secondary | ICD-10-CM

## 2023-07-12 MED ORDER — PHENTERMINE HCL 37.5 MG PO TABS: ORAL_TABLET | ORAL | 0 refills | Status: DC

## 2023-07-12 NOTE — Addendum Note (Signed)
 Addended by: Laneta Pintos on: 07/12/2023 05:01 PM   Modules accepted: Orders

## 2023-07-12 NOTE — Telephone Encounter (Signed)
 Please let the patient know that this medication is not expensive out of pocket if she uses a goodrx coupon, so she can still get the prescription if she wishes to pay out of pocket.

## 2023-07-12 NOTE — Telephone Encounter (Signed)
Informed pt of below via VM.

## 2023-07-12 NOTE — Telephone Encounter (Signed)
 Contacted pt and informed her of below and she would like the Rx to be sent to the [facility] on [address].

## 2023-07-26 ENCOUNTER — Ambulatory Visit
Admission: RE | Admit: 2023-07-26 | Discharge: 2023-07-26 | Disposition: A | Discharge status: Home / Self Care | Source: Ambulatory Visit | Attending: Family Medicine | Admitting: Family Medicine

## 2023-07-26 DIAGNOSIS — Z1231 Encounter for screening mammogram for malignant neoplasm of breast: Secondary | ICD-10-CM

## 2023-08-11 ENCOUNTER — Encounter: Payer: Self-pay | Admitting: Pediatrics

## 2023-08-11 ENCOUNTER — Ambulatory Visit

## 2023-08-11 VITALS — Ht 66.0 in | Wt 154.0 lb

## 2023-08-11 DIAGNOSIS — Z860100 Personal history of colon polyps, unspecified: Secondary | ICD-10-CM

## 2023-08-11 MED ORDER — NA SULFATE-K SULFATE-MG SULF 17.5-3.13-1.6 GM/177ML PO SOLN: 1.0000 | Freq: Once | ORAL | 0 refills | Status: AC

## 2023-08-11 NOTE — Progress Notes (Signed)
 No egg or soy allergy known to patient  No issues known to pt with past sedation with any surgeries or procedures Patient denies ever being told they had issues or difficulty with intubation  No FH of Malignant Hyperthermia  Pt is on diet pills, Pentermine and Wegovy , hold instructions provided.  Pt is not on  home 02  Pt is not on blood thinners  Pt denies issues with constipation, but states she did have problems not getting cleaned out with last colonoscopy.  Instructed to take extra Miralax.   No A fib or A flutter Have any cardiac testing pending--No Pt can ambulate  Pt denies use of chewing tobacco Discussed diabetic I weight loss medication holds Discussed NSAID holds Checked BMI Pt instructed to use Singlecare.com or GoodRx for a price reduction on prep  Patient's chart reviewed by Norleen Schillings CNRA prior to previsit and patient appropriate for the [facility].  Pre visit completed and red dot placed by patient's name on their procedure day (on provider's schedule).

## 2023-08-12 ENCOUNTER — Other Ambulatory Visit: Payer: Self-pay | Admitting: Family Medicine

## 2023-08-12 DIAGNOSIS — B0229 Other postherpetic nervous system involvement: Secondary | ICD-10-CM

## 2023-08-12 MED ORDER — GABAPENTIN 300 MG PO CAPS: ORAL_CAPSULE | ORAL | 2 refills | Status: DC

## 2023-08-12 NOTE — Telephone Encounter (Signed)
 Copied from CRM [id number]. Topic: Clinical - Medication Refill >> Aug 12, 2023  8:52 AM Avram MATSU wrote: Medication: gabapentin  (NEURONTIN ) 300 MG capsule [[id number]]  Has the patient contacted their pharmacy? Yes (Agent: If no, request that the patient contact the pharmacy for the refill. If patient does not wish to contact the pharmacy document the reason why and proceed with request.) (Agent: If yes, when and what did the pharmacy advise?)  This is the patient's preferred pharmacy:  [facility] [address] ([address]), [place] - [address] [place] ([place]) [place] [address] Phone: [phone] Fax: [phone]   Is this the correct pharmacy for this prescription? Yes If no, delete pharmacy and type the correct one.   Has the prescription been filled recently? No  Is the patient out of the medication? Yes  Has the patient been seen for an appointment in the last year OR does the patient have an upcoming appointment? Yes  Can we respond through MyChart? No  Agent: Please be advised that Rx refills may take up to 3 business days. We ask that you follow-up with your pharmacy.

## 2023-08-22 NOTE — Progress Notes (Unsigned)
 [facility] History and Physical   Primary Care Physician:  Chandra Toribio POUR, MD   Reason for Procedure:  History of adenomatous colon polyps  Plan:    Surveillance colonoscopy     HPI: Ruth Ward is a 68 y.o. female undergoing surveillance colonoscopy for a history of adenomatous colon polyps.  Last colonoscopy was performed in 2019 and showed 3 nonadvanced tubular adenomas.  Colonoscopy in 2006 was normal without any polyps.  No family history of colorectal cancer or polyps.  Patient denies rectal bleeding or change in bowel habits at the time of this exam.   Past Medical History:  Diagnosis Date   Allergy    Anxiety    Cancer (HCC)    basal cell; squamous cell   Climacteric    Depression    Fibrocystic breast    Hypercholesteremia    Hyperlipidemia    Hypertension    Thumb laceration, right, initial encounter    Vitamin D  deficiency     Past Surgical History:  Procedure Laterality Date   ABDOMINAL SURGERY  09/2016   tummy tuck   CESAREAN SECTION  1975 and 1980   I & D EXTREMITY Right 12/14/2018   Procedure: Right thumb nailbed repair, distal phalanx irrigation and debridement and closed reduction with percutaneous pinning;  Surgeon: Carolee Lynwood JINNY DOUGLAS, MD;  Location: [facility];  Service: Orthopedics;  Laterality: Right;  thumb    TUBAL LIGATION      Prior to Admission medications   Medication Sig Start Date End Date Taking? Authorizing Provider  atenolol  (TENORMIN ) 25 MG tablet Take 1 tablet by mouth once daily for blood pressure 07/06/23   Chandra Toribio POUR, MD  Cholecalciferol (VITAMIN D3) 125 MCG (5000 UT) TABS Take      1 capsule       Daily 01/14/20   Tonita Fallow, MD  FLUZONE HIGH-DOSE 0.5 ML injection  12/16/22   [provider]  gabapentin  (NEURONTIN ) 100 MG capsule Take 1 capsule (100 mg total) by mouth 2 (two) times daily with breakfast and lunch. 05/30/23   Chandra Toribio POUR, MD  gabapentin  (NEURONTIN ) 300  MG capsule TAKE 1 CAPSULE BY MOUTH THREE TIMES DAILY FOR NEUROPATHY PAIN 08/12/23   Chandra Toribio POUR, MD  hydrochlorothiazide  (HYDRODIURIL ) 25 MG tablet Take 1 tablet by mouth once daily 07/06/23   Olson, Daniel K, MD  MAGNESIUM PO Take by mouth.    [provider]  Multiple Vitamin (MULTIVITAMIN WITH MINERALS) TABS tablet Take 1 tablet by mouth daily.    [provider]  Omega-3 Fatty Acids (FISH OIL) 1000 MG CAPS Take by mouth.    [provider]  phentermine  (ADIPEX-P ) 37.5 MG tablet TAKE 1/2 TO 1 TABLET BY MOUTH IN THE MORNING FOR DIETING AND WEIGHT LOSS 07/12/23   Chandra Toribio POUR, MD  rosuvastatin  (CRESTOR ) 20 MG tablet Take 1 tablet (20 mg total) by mouth daily. for cholesterol. 05/31/23   Chandra Toribio POUR, MD  Semaglutide -Weight Management (WEGOVY ) 0.25 MG/0.5ML SOAJ Inject 0.25 mg into the skin once a week. 05/26/22   Wilkinson, Dana E, FNP  sertraline  (ZOLOFT ) 100 MG tablet TAKE 1 TABLET BY MOUTH ONCE DAILY FOR MOOD 06/24/23   Chandra Toribio POUR, MD  topiramate  (TOPAMAX ) 50 MG tablet TAKE 1/2 TO 1 TABLET BY MOUTH TWICE DAILY AT SUPPERTIME AND BEDTIME FOR DIETING AND WEIGHT LOSS 07/06/23   Chandra Toribio POUR, MD  Wheat Dextrin (BENEFIBER PO) Take by mouth.    [provider]  Zinc 50 MG TABS Take by mouth daily.    [provider]    Current Outpatient Medications  Medication Sig Dispense Refill   atenolol  (TENORMIN ) 25 MG tablet Take 1 tablet by mouth once daily for blood pressure 90 tablet 0   Cholecalciferol (VITAMIN D3) 125 MCG (5000 UT) TABS Take      1 capsule       Daily     gabapentin  (NEURONTIN ) 100 MG capsule Take 1 capsule (100 mg total) by mouth 2 (two) times daily with breakfast and lunch. 180 capsule 1   gabapentin  (NEURONTIN ) 300 MG capsule TAKE 1 CAPSULE BY MOUTH THREE TIMES DAILY FOR NEUROPATHY PAIN 270 capsule 2   hydrochlorothiazide  (HYDRODIURIL ) 25 MG tablet Take 1 tablet by mouth once daily 90 tablet 0   MAGNESIUM PO Take by mouth.      Multiple Vitamin (MULTIVITAMIN WITH MINERALS) TABS tablet Take 1 tablet by mouth daily.     Omega-3 Fatty Acids (FISH OIL) 1000 MG CAPS Take by mouth.     rosuvastatin  (CRESTOR ) 20 MG tablet Take 1 tablet (20 mg total) by mouth daily. for cholesterol. 90 tablet 2   sertraline  (ZOLOFT ) 100 MG tablet TAKE 1 TABLET BY MOUTH ONCE DAILY FOR MOOD 90 tablet 0   topiramate  (TOPAMAX ) 50 MG tablet TAKE 1/2 TO 1 TABLET BY MOUTH TWICE DAILY AT SUPPERTIME AND BEDTIME FOR DIETING AND WEIGHT LOSS 180 tablet 0   Zinc 50 MG TABS Take by mouth daily.     phentermine  (ADIPEX-P ) 37.5 MG tablet TAKE 1/2 TO 1 TABLET BY MOUTH IN THE MORNING FOR DIETING AND WEIGHT LOSS 90 tablet 0   Semaglutide -Weight Management (WEGOVY ) 0.25 MG/0.5ML SOAJ Inject 0.25 mg into the skin once a week. 2 mL 2   Wheat Dextrin (BENEFIBER PO) Take by mouth.     Current Facility-Administered Medications  Medication Dose Route Frequency Provider Last Rate Last Admin   0.9 %  sodium chloride  infusion  500 mL Intravenous Continuous Abimelec Grochowski, Inocente HERO, MD        Allergies as of 08/25/2023 - Review Complete 08/25/2023  Allergen Reaction Noted   Amoxicillin Rash 05/30/2023   Atorvastatin Nausea Only 12/01/2012    Family History  Problem Relation Age of Onset   Hypertension Mother    Diabetes Mother    Hypertension Father    Heart disease Father    Cancer Father    Prostate cancer Father    Hypertension Brother    Diabetes Brother    Diabetes Brother    Diabetes Other    Colon cancer Neg Hx    Rectal cancer Neg Hx    Stomach cancer Neg Hx    Esophageal cancer Neg Hx     Social History   Socioeconomic History   Marital status: Married    Spouse name: Not on file   Number of children: Not on file   Years of education: Not on file   Highest education level: Not on file  Occupational History   Not on file  Tobacco Use   Smoking status: Former    Current packs/day: 0.00    Types: Cigarettes    Quit date: 02/15/1986    Years  since quitting: 37.5   Smokeless tobacco: Never  Vaping Use   Vaping status: Never Used  Substance and Sexual Activity   Alcohol use: No   Drug use: No   Sexual activity: Not on file  Other Topics Concern   Not on  file  Social History Narrative   Not on file   Social Drivers of Health   Financial Resource Strain: Not on file  Food Insecurity: Not on file  Transportation Needs: Not on file  Physical Activity: Not on file  Stress: Not on file  Social Connections: Not on file  Intimate Partner Violence: Not on file    Review of Systems:  All other review of systems negative except as mentioned in the HPI.  Physical Exam: Vital signs BP (!) 175/94   Pulse 61   Temp 98.6 F (37 C) (Temporal)   Resp 14   Ht 5' 6 (1.676 m)   Wt 154 lb (69.9 kg)   SpO2 100%   BMI 24.86 kg/m   General:   Alert,  Well-developed, well-nourished, pleasant and cooperative in NAD Airway:  Mallampati 1 Lungs:  Clear throughout to auscultation.   Heart:  Regular rate and rhythm; no murmurs, clicks, rubs,  or gallops. Abdomen:  Soft, nontender and nondistended. Normal bowel sounds.   Neuro/Psych:  Normal mood and affect. A and O x 3  Inocente Hausen, MD [facility]

## 2023-08-23 ENCOUNTER — Other Ambulatory Visit: Payer: Self-pay | Admitting: *Deleted

## 2023-08-23 DIAGNOSIS — I10 Essential (primary) hypertension: Secondary | ICD-10-CM

## 2023-08-23 DIAGNOSIS — E785 Hyperlipidemia, unspecified: Secondary | ICD-10-CM

## 2023-08-23 DIAGNOSIS — Z131 Encounter for screening for diabetes mellitus: Secondary | ICD-10-CM

## 2023-08-24 ENCOUNTER — Other Ambulatory Visit

## 2023-08-24 DIAGNOSIS — Z131 Encounter for screening for diabetes mellitus: Secondary | ICD-10-CM

## 2023-08-24 DIAGNOSIS — E785 Hyperlipidemia, unspecified: Secondary | ICD-10-CM

## 2023-08-24 DIAGNOSIS — I10 Essential (primary) hypertension: Secondary | ICD-10-CM

## 2023-08-25 ENCOUNTER — Encounter: Payer: Self-pay | Admitting: Pediatrics

## 2023-08-25 ENCOUNTER — Ambulatory Visit: Admitting: Pediatrics

## 2023-08-25 VITALS — BP 142/80 | HR 65 | Temp 98.6°F | Resp 17 | Ht 66.0 in | Wt 154.0 lb

## 2023-08-25 DIAGNOSIS — K635 Polyp of colon: Secondary | ICD-10-CM

## 2023-08-25 DIAGNOSIS — D128 Benign neoplasm of rectum: Secondary | ICD-10-CM

## 2023-08-25 DIAGNOSIS — Z1211 Encounter for screening for malignant neoplasm of colon: Secondary | ICD-10-CM | POA: Diagnosis not present

## 2023-08-25 DIAGNOSIS — D122 Benign neoplasm of ascending colon: Secondary | ICD-10-CM

## 2023-08-25 DIAGNOSIS — K648 Other hemorrhoids: Secondary | ICD-10-CM | POA: Diagnosis not present

## 2023-08-25 DIAGNOSIS — D124 Benign neoplasm of descending colon: Secondary | ICD-10-CM

## 2023-08-25 DIAGNOSIS — D123 Benign neoplasm of transverse colon: Secondary | ICD-10-CM | POA: Diagnosis not present

## 2023-08-25 DIAGNOSIS — K644 Residual hemorrhoidal skin tags: Secondary | ICD-10-CM

## 2023-08-25 DIAGNOSIS — Z860100 Personal history of colon polyps, unspecified: Secondary | ICD-10-CM

## 2023-08-25 LAB — CBC WITH DIFFERENTIAL/PLATELET
Basophils Absolute: 0 x10E3/uL (ref 0.0–0.2)
Basos: 1 %
EOS (ABSOLUTE): 0 x10E3/uL (ref 0.0–0.4)
Eos: 1 %
Hematocrit: 43.1 % (ref 34.0–46.6)
Hemoglobin: 14.4 g/dL (ref 11.1–15.9)
Immature Grans (Abs): 0 x10E3/uL (ref 0.0–0.1)
Immature Granulocytes: 0 %
Lymphocytes Absolute: 1.6 x10E3/uL (ref 0.7–3.1)
Lymphs: 36 %
MCH: 32.2 pg (ref 26.6–33.0)
MCHC: 33.4 g/dL (ref 31.5–35.7)
MCV: 96 fL (ref 79–97)
Monocytes Absolute: 0.4 x10E3/uL (ref 0.1–0.9)
Monocytes: 8 %
Neutrophils Absolute: 2.4 x10E3/uL (ref 1.4–7.0)
Neutrophils: 54 %
Platelets: 221 x10E3/uL (ref 150–450)
RBC: 4.47 x10E6/uL (ref 3.77–5.28)
RDW: 12.2 % (ref 11.7–15.4)
WBC: 4.5 x10E3/uL (ref 3.4–10.8)

## 2023-08-25 LAB — COMPREHENSIVE METABOLIC PANEL WITH GFR
ALT: 51 IU/L — ABNORMAL HIGH (ref 0–32)
AST: 38 IU/L (ref 0–40)
Albumin: 4.2 g/dL (ref 3.9–4.9)
Alkaline Phosphatase: 124 IU/L — ABNORMAL HIGH (ref 44–121)
BUN/Creatinine Ratio: 24 (ref 12–28)
BUN: 15 mg/dL (ref 8–27)
Bilirubin Total: 0.3 mg/dL (ref 0.0–1.2)
CO2: 26 mmol/L (ref 20–29)
Calcium: 9.2 mg/dL (ref 8.7–10.3)
Chloride: 103 mmol/L (ref 96–106)
Creatinine, Ser: 0.62 mg/dL (ref 0.57–1.00)
Globulin, Total: 2.3 g/dL (ref 1.5–4.5)
Glucose: 89 mg/dL (ref 70–99)
Potassium: 3.8 mmol/L (ref 3.5–5.2)
Sodium: 142 mmol/L (ref 134–144)
Total Protein: 6.5 g/dL (ref 6.0–8.5)
eGFR: 96 mL/min/1.73 (ref >59)

## 2023-08-25 LAB — LIPID PANEL
Chol/HDL Ratio: 2.4 ratio (ref 0.0–4.4)
Cholesterol, Total: 147 mg/dL (ref 100–199)
HDL: 61 mg/dL (ref >39)
LDL Chol Calc (NIH): 67 mg/dL (ref 0–99)
Triglycerides: 107 mg/dL (ref 0–149)
VLDL Cholesterol Cal: 19 mg/dL (ref 5–40)

## 2023-08-25 LAB — HEMOGLOBIN A1C
Est. average glucose Bld gHb Est-mCnc: 108 mg/dL
Hgb A1c MFr Bld: 5.4 % (ref 4.8–5.6)

## 2023-08-25 MED ORDER — SODIUM CHLORIDE 0.9 % IV SOLN: 500.0000 mL | INTRAVENOUS | Status: DC

## 2023-08-25 NOTE — Patient Instructions (Signed)

## 2023-08-25 NOTE — Progress Notes (Signed)
 Report given to PACU, vss

## 2023-08-25 NOTE — Progress Notes (Signed)
 Pt's states no medical or surgical changes since previsit or office visit.

## 2023-08-25 NOTE — Op Note (Signed)
 [facility] Patient Name: Ruth Ward Procedure Date: 08/25/2023 10:05 AM MRN: [id number] Endoscopist: Inocente Hausen , MD, [id number] Age: 68 Referring MD:  Date of Birth: [birth year] Gender: Female Account #: [id number] Procedure:                Colonoscopy Indications:              High risk colon cancer surveillance: Personal                            history of multiple (3 or more) adenomas, Last                            colonoscopy: March 2019 Medicines:                Monitored Anesthesia Care Procedure:                Pre-Anesthesia Assessment:                           - Prior to the procedure, a History and Physical                            was performed, and patient medications and                            allergies were reviewed. The patient's tolerance of                            previous anesthesia was also reviewed. The risks                            and benefits of the procedure and the sedation                            options and risks were discussed with the patient.                            All questions were answered, and informed consent                            was obtained. Prior Anticoagulants: The patient has                            taken no anticoagulant or antiplatelet agents. ASA                            Grade Assessment: II - A patient with mild systemic                            disease. After reviewing the risks and benefits,                            the patient was deemed in satisfactory condition to  undergo the procedure.                           After obtaining informed consent, the colonoscope                            was passed under direct vision. Throughout the                            procedure, the patient's blood pressure, pulse, and                            oxygen saturations were monitored continuously. The                            Olympus Scope SN: [id number] was  introduced through                            the anus and advanced to the cecum, identified by                            appendiceal orifice and ileocecal valve. The                            colonoscopy was performed without difficulty. The                            patient tolerated the procedure well. The quality                            of the bowel preparation was good. The ileocecal                            valve, appendiceal orifice, and rectum were                            photographed. Scope In: 10:54:10 AM Scope Out: 11:17:37 AM Scope Withdrawal Time: 0 hours 16 minutes 35 seconds  Total Procedure Duration: 0 hours 23 minutes 27 seconds  Findings:                 Hemorrhoids were found on perianal exam.                           The digital rectal exam was normal. Pertinent                            negatives include normal sphincter tone and no                            palpable rectal lesions.                           Three sessile polyps were found in the descending  colon, transverse colon and ascending colon. The                            polyps were 4 to 5 mm in size. These polyps were                            removed with a cold snare. Resection and retrieval                            were complete.                           A 3 mm polyp was found in the rectum. The polyp was                            sessile. The polyp was removed with a cold biopsy                            forceps. Resection and retrieval were complete.                           Internal hemorrhoids were found during retroflexion. Complications:            No immediate complications. Estimated blood loss:                            Minimal. Estimated Blood Loss:     Estimated blood loss was minimal. Impression:               - Hemorrhoids found on perianal exam.                           - Three 4 to 5 mm polyps in the descending colon,                             in the transverse colon and in the ascending colon,                            removed with a cold snare. Resected and retrieved.                           - One 3 mm polyp in the rectum, removed with a cold                            biopsy forceps. Resected and retrieved.                           - Internal hemorrhoids. Recommendation:           - Discharge patient to home (ambulatory).                           - Await pathology results.                           -  Repeat colonoscopy for surveillance based on                            pathology results.                           - The findings and recommendations were discussed                            with the patient's family.                           - Patient has a contact number available for                            emergencies. The signs and symptoms of potential                            delayed complications were discussed with the                            patient. Return to normal activities tomorrow.                            Written discharge instructions were provided to the                            patient. Inocente Hausen, MD 08/25/2023 11:22:29 AM This report has been signed electronically.

## 2023-08-25 NOTE — Progress Notes (Signed)
 Called to room to assist during endoscopic procedure.  Patient ID and intended procedure confirmed with present staff. Received instructions for my participation in the procedure from the performing physician.

## 2023-08-26 ENCOUNTER — Ambulatory Visit: Payer: Self-pay | Admitting: Family Medicine

## 2023-08-26 ENCOUNTER — Telehealth: Payer: Self-pay | Admitting: *Deleted

## 2023-08-26 NOTE — Telephone Encounter (Signed)
  Follow up Call-     08/25/2023    9:32 AM  Call back number  Post procedure Call Back phone  # [phone]  Permission to leave phone message Yes     Patient questions:  Do you have a fever, pain , or abdominal swelling? No. Pain Score  0 *  Have you tolerated food without any problems? Yes.    Have you been able to return to your normal activities? Yes.    Do you have any questions about your discharge instructions: Diet   No. Medications  No. Follow up visit  No.  Do you have questions or concerns about your Care? No.  Actions: * If pain score is 4 or above: No action needed, pain <4.

## 2023-08-29 ENCOUNTER — Ambulatory Visit: Payer: Self-pay | Admitting: Pediatrics

## 2023-08-29 ENCOUNTER — Telehealth: Payer: Self-pay

## 2023-08-29 ENCOUNTER — Ambulatory Visit (INDEPENDENT_AMBULATORY_CARE_PROVIDER_SITE_OTHER): Admitting: Family Medicine

## 2023-08-29 ENCOUNTER — Encounter: Payer: Self-pay | Admitting: Family Medicine

## 2023-08-29 VITALS — BP 134/82 | HR 68 | Ht 66.0 in | Wt 157.2 lb

## 2023-08-29 DIAGNOSIS — I781 Nevus, non-neoplastic: Secondary | ICD-10-CM | POA: Insufficient documentation

## 2023-08-29 DIAGNOSIS — R7989 Other specified abnormal findings of blood chemistry: Secondary | ICD-10-CM

## 2023-08-29 DIAGNOSIS — Z7689 Persons encountering health services in other specified circumstances: Secondary | ICD-10-CM

## 2023-08-29 DIAGNOSIS — B0222 Postherpetic trigeminal neuralgia: Secondary | ICD-10-CM

## 2023-08-29 DIAGNOSIS — K760 Fatty (change of) liver, not elsewhere classified: Secondary | ICD-10-CM | POA: Diagnosis not present

## 2023-08-29 DIAGNOSIS — Z0000 Encounter for general adult medical examination without abnormal findings: Secondary | ICD-10-CM | POA: Diagnosis not present

## 2023-08-29 DIAGNOSIS — Z6829 Body mass index (BMI) 29.0-29.9, adult: Secondary | ICD-10-CM

## 2023-08-29 DIAGNOSIS — E663 Overweight: Secondary | ICD-10-CM

## 2023-08-29 LAB — SURGICAL PATHOLOGY

## 2023-08-29 MED ORDER — PHENTERMINE HCL 37.5 MG PO TABS: ORAL_TABLET | ORAL | 0 refills | Status: AC

## 2023-08-29 NOTE — Patient Instructions (Signed)
 It was nice to see you today,  We addressed the following topics today: -I am sending in a referral to neurology.  If you have not heard from somebody in 1 month let us  know - I have sent in your phentermine  to [facility].  You should be able to pick this up without insurance coverage.  You can tell them that you would like to use a GoodRx coupon to get the cheapest cost.   Have a great day,  Rolan Slain, MD

## 2023-08-29 NOTE — Assessment & Plan Note (Signed)
 Currently on phentermine , topiramate , and Wegovy  for weight management. Weight is stable at a healthy level. Topiramate  also provides some pain relief. - Continue current medications. Can consider discontinuing phentermine  or Wegovy  in the future if desired.

## 2023-08-29 NOTE — Assessment & Plan Note (Signed)
 Telangiectasias: Concerned about spider veins on legs. No associated symptoms like pain or swelling. - Reassured this is a cosmetic issue. - Counselled on the nature of spider veins and lack of specific treatment to reverse them.

## 2023-08-29 NOTE — Telephone Encounter (Signed)
 Copied from CRM [id number]. Topic: Clinical - Prescription Issue >> Aug 29, 2023 11:37 AM Corin V wrote: Reason for CRM: Patient called and stated that the issue with the phentermine  is with the manufacturer, not the pharmacy, and the patient's BMI. She said they need provider authorization (possibly prior authorization) to get this approved. Please contact [facility] at [phone] with approval.

## 2023-08-29 NOTE — Assessment & Plan Note (Signed)
 Alt mildly elevated again w/ slightly elevated alk phos.  Recheck in 3 weeks

## 2023-08-29 NOTE — Telephone Encounter (Signed)
 Pharmacy called and stated that they need office notes stating how long the pt will be on the Phentermine  and why is it a medical necessity for her to continue the medication since her BMI is under the 27 BMI guideline.  Please fax notes to [phone]

## 2023-08-29 NOTE — Assessment & Plan Note (Signed)
 Post-herpetic neuralgia: Pain is ongoing, particularly at night, with partial relief from gabapentin . Declines dose increase at this time. Discussed other treatment options including seeing a neurologist for consideration of Botox or other medications. - Refer to Neurology for evaluation and management recommendations. - Continue current gabapentin  dose. - Follow up in 6 months.

## 2023-08-29 NOTE — Progress Notes (Unsigned)
 Established Patient Office Visit  Subjective   Patient ID: Ruth Ward, female    DOB: [birth year]  Age: 68 y.o. MRN: [id number]  Chief Complaint  Patient presents with   Medical Management of Chronic Issues    HPI  Subjective - Post-herpetic neuralgia: Reports some, but not significant, improvement with gabapentin  300 mg TID. Pain is worse at night before bed. Usually takes three 300 mg capsules at night, but sometimes only two. During the day, the pain is less severe and adequately controlled more than half the time. Discussed that increasing gabapentin  further may lead to tolerance. Declines increasing the morning and afternoon doses to 300 mg at this time. - Medication refill: Reports [facility] and [facility] would not fill phentermine  prescription. States the medication helps with energy. Takes half a tablet daily. - Skin concern: Asks about spider veins on legs, noting one area is worse than others. Reports no pain or swelling.  Medications Current medications include atenolol , gabapentin , hydrochlorothiazide , magnesium, multivitamin, fish oil, phentermine , Crestor  (rosuvastatin ), Zoloft  (sertraline ), Topamax  (topiramate ), and Wegovy  (semaglutide ). The phentermine  is taken as a half tablet. Topiramate  is taken as two tablets daily, one with the evening meal and one at bedtime, for pain and weight management. Has not taken Wegovy  recently.  PMH, PSH, FH, Social Hx - PMHx: Post-herpetic neuralgia following a shingles outbreak, hypertension, hyperlipidemia, obesity. - PSHx: Recent colonoscopy last week, pathology results are pending. They found four polyps. - FHx: Sister and daughter are patients at this practice. - Social Hx: Denies excessive alcohol use.  ROS - Constitutional: No fever, no body aches reported with vaccinations. Reports low energy when not taking phentermine . - Neurological: Reports nerve pain. Denies other neurological symptoms. - Integumentary: Notes spider veins  on legs. No pain or swelling.    The 10-year ASCVD risk score (Arnett DK, et al., 2019) is: 10.9%  Health Maintenance Due  Topic Date Due   COVID-19 Vaccine (7 - 2024-25 season) 10/17/2022   Medicare Annual Wellness (AWV)  05/26/2023      Objective:     BP 134/82   Pulse 68   Ht 5' 6 (1.676 m)   Wt 157 lb 3.2 oz (71.3 kg)   SpO2 100%   BMI 25.37 kg/m  {Vitals History (Optional):23777}  Physical Exam General: Alert and oriented. CV: Regular rate and rhythm, no murmurs. PULM: Clear to auscultation bilaterally. SKIN: Noted telangiectasias on legs without evidence of bulging varicose veins, pain, or swelling.   No results found for any visits on 08/29/23.      Assessment & Plan:   Physical exam, annual  Overweight with body mass index (BMI) of 28 to 28.9 in adult -     Phentermine  HCl; TAKE 1/2 TO 1 TABLET BY MOUTH IN THE MORNING FOR DIETING AND WEIGHT LOSS  Dispense: 90 tablet; Refill: 0  Elevated LFTs -     Comprehensive metabolic panel with GFR; Future  Trigeminal neuralgia, postherpetic Assessment & Plan: Post-herpetic neuralgia: Pain is ongoing, particularly at night, with partial relief from gabapentin . Declines dose increase at this time. Discussed other treatment options including seeing a neurologist for consideration of Botox or other medications. - Refer to Neurology for evaluation and management recommendations. - Continue current gabapentin  dose. - Follow up in 6 months.  Orders: -     Ambulatory referral to Neurology  Fatty liver Assessment & Plan: Alt mildly elevated again w/ slightly elevated alk phos.  Recheck in 3 weeks   Overweight with body mass index (  BMI) of 29 to 29.9 in adult Assessment & Plan: Currently on phentermine , topiramate , and Wegovy  for weight management. Weight is stable at a healthy level. Topiramate  also provides some pain relief. - Continue current medications. Can consider discontinuing phentermine  or Wegovy  in the  future if desired.   Telangiectasias Assessment & Plan: Telangiectasias: Concerned about spider veins on legs. No associated symptoms like pain or swelling. - Reassured this is a cosmetic issue. - Counselled on the nature of spider veins and lack of specific treatment to reverse them.      Return in about 6 months (around 02/29/2024) for weight, neuralgia.    Toribio MARLA Slain, MD

## 2023-08-30 NOTE — Telephone Encounter (Signed)
 Pt called in about an update on the Refill. I advised her once note was complete it will be faxed over to the pharmacy and it will be up to the pharmacy at that time whether or not that fill the Rx.   Pt states she would like to know Dr. Chandra recommendation on whether or not he thinks she should stay on it and hoping that she can because she likes the energy it gives her.   Please fax office note after its complete to pharmacy and reach out to pt and let her know when its sent. Thanks

## 2023-09-06 ENCOUNTER — Other Ambulatory Visit: Payer: Self-pay | Admitting: Family Medicine

## 2023-09-06 ENCOUNTER — Telehealth: Payer: Self-pay

## 2023-09-06 DIAGNOSIS — Z6829 Body mass index (BMI) 29.0-29.9, adult: Secondary | ICD-10-CM

## 2023-09-06 NOTE — Telephone Encounter (Signed)
 Copied from CRM [id number]. Topic: Clinical - Prescription Issue >> Sep 06, 2023 10:21 AM Cleave MATSU wrote: Reason for CRM: pharmacy stated that they have to actually speak with the Dr about medication. They cannot take fax. This is [facility], [facility] on [address]. Follow up with pt after Dr speak with pharmacy

## 2023-09-06 NOTE — Telephone Encounter (Signed)
 Last OV 08/29/2023

## 2023-09-06 NOTE — Telephone Encounter (Signed)
 Copied from CRM [id number]. Topic: Clinical - Medication Refill >> Sep 06, 2023 10:12 AM Emylou G wrote: Medication: phentermine  (ADIPEX-P ) 37.5 MG tablet  Has the patient contacted their pharmacy? No (Agent: If no, request that the patient contact the pharmacy for the refill. If patient does not wish to contact the pharmacy document the reason why and proceed with request.) (Agent: If yes, when and what did the pharmacy advise?)  This is the patient's preferred pharmacy:  [facility] [address] ([address] [place] - [address] [place] ([place]) [place] [address] Phone: [phone] Fax: [phone]    Is this the correct pharmacy for this prescription? Yes If no, delete pharmacy and type the correct one.   Has the prescription been filled recently? No  Is the patient out of the medication? Yes  Has the patient been seen for an appointment in the last year OR does the patient have an upcoming appointment? Yes  Can we respond through MyChart? Yes  Agent: Please be advised that Rx refills may take up to 3 business days. We ask that you follow-up with your pharmacy.

## 2023-09-07 ENCOUNTER — Telehealth: Payer: Self-pay

## 2023-09-07 ENCOUNTER — Ambulatory Visit: Payer: Medicare Other | Admitting: Internal Medicine

## 2023-09-07 NOTE — Telephone Encounter (Signed)
 Copied from CRM #[id number]. Topic: Clinical - Prescription Issue >> Sep 07, 2023 12:09 PM Emylou G wrote: Reason for CRM: Removed [facility] from future refills[facility] she wants [facility] use  It looks like [facility] has been removed from the pharmacy list. [facility].

## 2023-09-07 NOTE — Telephone Encounter (Signed)
 I called the [facility] pharmacy and discussed it with them.  They are agreeable to filling the medication.

## 2023-09-07 NOTE — Telephone Encounter (Signed)
 Copied from CRM #[id number]. Topic: Clinical - Prescription Issue >> Sep 07, 2023 12:09 PM Emylou G wrote: Reason for CRM: Removed [facility] from future refills.[facility] she wants to use   It looks like

## 2023-09-20 ENCOUNTER — Other Ambulatory Visit: Payer: Self-pay | Admitting: Family Medicine

## 2023-09-20 DIAGNOSIS — F3342 Major depressive disorder, recurrent, in full remission: Secondary | ICD-10-CM

## 2023-09-20 MED ORDER — SERTRALINE HCL 100 MG PO TABS: ORAL_TABLET | ORAL | 1 refills | Status: DC

## 2023-09-20 NOTE — Telephone Encounter (Signed)
 Copied from CRM [id number]. Topic: Clinical - Medication Refill >> Sep 20, 2023  9:23 AM Elle L wrote: Medication: sertraline  (ZOLOFT ) 100 MG tablet  Has the patient contacted their pharmacy? Yes  This is the patient's preferred pharmacy:  [id number] [address], [place] - [address] [place] [address] Phone: [phone] Fax: [phone]  Is this the correct pharmacy for this prescription? Yes  Has the prescription been filled recently? Yes  Is the patient out of the medication? No  Has the patient been seen for an appointment in the last year OR does the patient have an upcoming appointment? Yes  Can we respond through MyChart? Yes  Agent: Please be advised that Rx refills may take up to 3 business days. We ask that you follow-up with your pharmacy.

## 2023-09-21 ENCOUNTER — Other Ambulatory Visit

## 2023-09-21 DIAGNOSIS — R7989 Other specified abnormal findings of blood chemistry: Secondary | ICD-10-CM

## 2023-09-22 ENCOUNTER — Other Ambulatory Visit: Payer: Self-pay | Admitting: Family Medicine

## 2023-09-22 DIAGNOSIS — Z6828 Body mass index (BMI) 28.0-28.9, adult: Secondary | ICD-10-CM

## 2023-09-22 DIAGNOSIS — I10 Essential (primary) hypertension: Secondary | ICD-10-CM

## 2023-09-22 DIAGNOSIS — R001 Bradycardia, unspecified: Secondary | ICD-10-CM

## 2023-09-22 LAB — COMPREHENSIVE METABOLIC PANEL WITH GFR
ALT: 27 IU/L (ref 0–32)
AST: 22 IU/L (ref 0–40)
Albumin: 4.5 g/dL (ref 3.9–4.9)
Alkaline Phosphatase: 96 IU/L (ref 44–121)
BUN/Creatinine Ratio: 26 (ref 12–28)
BUN: 15 mg/dL (ref 8–27)
Bilirubin Total: 0.3 mg/dL (ref 0.0–1.2)
CO2: 24 mmol/L (ref 20–29)
Calcium: 9.2 mg/dL (ref 8.7–10.3)
Chloride: 103 mmol/L (ref 96–106)
Creatinine, Ser: 0.57 mg/dL (ref 0.57–1.00)
Globulin, Total: 1.9 g/dL (ref 1.5–4.5)
Glucose: 91 mg/dL (ref 70–99)
Potassium: 3.8 mmol/L (ref 3.5–5.2)
Sodium: 142 mmol/L (ref 134–144)
Total Protein: 6.4 g/dL (ref 6.0–8.5)
eGFR: 98 mL/min/1.73 (ref >59)

## 2023-09-22 MED ORDER — ATENOLOL 25 MG PO TABS
ORAL_TABLET | ORAL | 1 refills | Status: DC
Start: 2023-09-22 — End: 2023-10-03

## 2023-09-22 MED ORDER — TOPIRAMATE 50 MG PO TABS: ORAL_TABLET | ORAL | 1 refills | Status: DC

## 2023-09-22 MED ORDER — HYDROCHLOROTHIAZIDE 25 MG PO TABS: ORAL_TABLET | ORAL | 1 refills | Status: DC

## 2023-09-22 NOTE — Telephone Encounter (Signed)
 Copied from CRM [id number]. Topic: Clinical - Medication Refill >> Sep 22, 2023 10:38 AM Larissa S wrote: Medication: atenolol  (TENORMIN ) 25 MG tablet topiramate  (TOPAMAX ) 50 MG tablet hydrochlorothiazide  (HYDRODIURIL ) 25 MG tablet  Has the patient contacted their pharmacy? Yes (Agent: If no, request that the patient contact the pharmacy for the refill. If patient does not wish to contact the pharmacy document the reason why and proceed with request.) (Agent: If yes, when and what did the pharmacy advise?)  This is the patient's preferred pharmacy:  [id number] [address], [place] - [address] [place] [address] Phone: [phone] Fax: [phone]  Is this the correct pharmacy for this prescription? Yes If no, delete pharmacy and type the correct one.   Has the prescription been filled recently? No  Is the patient out of the medication? No  Has the patient been seen for an appointment in the last year OR does the patient have an upcoming appointment? Yes  Can we respond through MyChart? No  Agent: Please be advised that Rx refills may take up to 3 business days. We ask that you follow-up with your pharmacy.

## 2023-09-26 ENCOUNTER — Other Ambulatory Visit: Payer: Self-pay | Admitting: Family Medicine

## 2023-09-26 MED ORDER — GABAPENTIN 100 MG PO CAPS: 100.0000 mg | ORAL_CAPSULE | Freq: Two times a day (BID) | ORAL | 1 refills | Status: DC

## 2023-09-26 NOTE — Telephone Encounter (Signed)
 Copied from CRM [id number]. Topic: Clinical - Medication Refill >> Sep 26, 2023 10:38 AM Deleta RAMAN wrote: Medication:  gabapentin  (NEURONTIN ) 100 MG capsule   Has the patient contacted their pharmacy? No (Agent: If no, request that the patient contact the pharmacy for the refill. If patient does not wish to contact the pharmacy document the reason why and proceed with request.) (Agent: If yes, when and what did the pharmacy advise?)  This is the patient's preferred pharmacy:  [phone] [address], [place] - [address] [place] [address] Phone: [phone] Fax: [phone]  Is this the correct pharmacy for this prescription? Yes If no, delete pharmacy and type the correct one.   Has the prescription been filled recently? No  Is the patient out of the medication? No  Has the patient been seen for an appointment in the last year OR does the patient have an upcoming appointment? Yes  Can we respond through MyChart? No  Agent: Please be advised that Rx refills may take up to 3 business days. We ask that you follow-up with your pharmacy.

## 2023-09-27 ENCOUNTER — Ambulatory Visit: Payer: Self-pay | Admitting: Family Medicine

## 2023-09-29 ENCOUNTER — Telehealth: Payer: Self-pay | Admitting: Neurology

## 2023-09-29 ENCOUNTER — Ambulatory Visit: Admitting: Neurology

## 2023-09-29 ENCOUNTER — Encounter: Payer: Self-pay | Admitting: Neurology

## 2023-09-29 VITALS — BP 128/84 | HR 71 | Ht 66.0 in | Wt 160.0 lb

## 2023-09-29 DIAGNOSIS — B0229 Other postherpetic nervous system involvement: Secondary | ICD-10-CM | POA: Diagnosis not present

## 2023-09-29 DIAGNOSIS — G509 Disorder of trigeminal nerve, unspecified: Secondary | ICD-10-CM | POA: Diagnosis not present

## 2023-09-29 DIAGNOSIS — B0222 Postherpetic trigeminal neuralgia: Secondary | ICD-10-CM

## 2023-09-29 MED ORDER — GABAPENTIN ENACARBIL ER 600 MG PO TBCR: 600.0000 mg | EXTENDED_RELEASE_TABLET | Freq: Every evening | ORAL | 11 refills | Status: DC

## 2023-09-29 NOTE — Progress Notes (Addendum)
 [id number]    Provider:  Dr Ines Requesting Provider: Chandra Toribio POUR, MD Primary Care Provider:  Chandra Toribio POUR, MD  CC:  postherpetic V1 neuralgia  HPI:  Ruth Ward is a 68 y.o. female here as requested by Chandra Toribio POUR, MD for postherpetic neuralgia. 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 I reviewed Dr. Adine notes from August 29, 2023, patient has postherpetic trigeminal neuralgia, improved with gabapentin  300 mg 3 times daily, usually takes 3 capsules at night but sometimes only 2, during the day the pain is less severe and adequately controlled more than half the time, she declines increasing the morning and afternoon doses.  She is here, she had shingles, in her eyes and her forehead, the pain is in her eyebrow, eye and scalp, and her hair follicles are sore at times, the only thing that seems to relieve it is ice/pressure. It is burning and pain and itching. Iike a very bad poison oak.  Had shingles in 2023. Shows me a picture and appears it is V1, did not get into the cornea or into the eyes lesions very demarcated to V1 on the picture(see on the picture below). She has tried gabapentin  and in the morning it is not painful bc she takes the gabapentin  in the evening(900mg ) and then prn 300mg  throughout the day. It helps. Makes her sedated during the day. In the morning however can be excruciating. No other focal neurologic deficits, associated symptoms, inciting events or modifiable factors.  Reviewed notes, labs and imaging from outside physicians, which showed:  Mri brain 2016: reviewed imaging and I think that  the white matter changes are within normal age limits otherwise agree. CLINICAL DATA:  Confusion. Dizziness, tremors, weakness, lightheadedness, and numbness.   EXAM: MRI HEAD WITHOUT CONTRAST   TECHNIQUE: Multiplanar, multiecho pulse sequences of the brain and surrounding structures were obtained without intravenous contrast.   COMPARISON:  CT head without contrast 04/15/2006   FINDINGS: The diffusion-weighted images demonstrate no evidence for acute or subacute infarction. Mild periventricular and scattered subcortical T2 hyperintensities are slightly greater than expected for age. No other significant white matter disease is present. The brainstem and cerebellum are within normal limits.   The internal auditory canals within normal limits. Flow is present in the major intracranial arteries. The globes and orbits are intact. The paranasal sinuses and mastoid air cells are clear.   Skullbase is within normal limits. Midline structures are unremarkable.   IMPRESSION: 1. Mild periventricular and scattered subcortical T2 hyperintensities bilaterally are greater than expected for age. The finding is nonspecific but can be seen in the setting of chronic microvascular ischemia, a demyelinating process such as multiple sclerosis, vasculitis, complicated migraine headaches, or as the sequelae of a prior infectious or inflammatory process. 2. No acute intracranial abnormality.  August 24, 2023, lipid panel normal, hemoglobin A1c 5.4 normal, CBC normal, CMP with elevated alk phos 124, elevated ALT 51 otherwise normal.  Review of Systems: Patient complains of symptoms per HPI as well as the following symptoms none. Pertinent negatives and positives per HPI. All others negative.   Social History   Socioeconomic History   Marital status: Married    Spouse name: Not on file   Number of children: Not  on file   Years of education: Not on file   Highest education level: 12th grade   Occupational History   Not on file  Tobacco Use   Smoking status: Former    Current packs/day: 0.00    Types: Cigarettes    Quit date: 02/15/1986    Years since quitting: 37.6   Smokeless tobacco: Never  Vaping Use   Vaping status: Never Used  Substance and Sexual Activity   Alcohol use: No   Drug use: No   Sexual activity: Not on file  Other Topics Concern   Not on file  Social History Narrative   Pt lives with spouse    Retired    Chief Executive Officer Drivers of Corporate investment banker Strain: Low Risk  (08/28/2023)   Overall Financial Resource Strain (CARDIA)    Difficulty of Paying Living Expenses: Not hard at all  Food Insecurity: No Food Insecurity (08/28/2023)   Hunger Vital Sign    Worried About Running Out of Food in the Last Year: Never true    Ran Out of Food in the Last Year: Never true  Transportation Needs: No Transportation Needs (08/28/2023)   PRAPARE - Administrator, Civil Service (Medical): No    Lack of Transportation (Non-Medical): No  Physical Activity: Sufficiently Active (08/28/2023)   Exercise Vital Sign    Days of Exercise per Week: 3 days    Minutes of Exercise per Session: 60 min  Stress: No Stress Concern Present (08/28/2023)   [employer] of Occupational Health - Occupational Stress Questionnaire    Feeling of Stress: Only a little  Social Connections: Socially Integrated (08/28/2023)   Social Connection and Isolation Panel    Frequency of Communication with Friends and Family: More than three times a week    Frequency of Social Gatherings with Friends and Family: More than three times a week    Attends Religious Services: More than 4 times per year    Active Member of Clubs or Organizations: Yes    Attends Engineer, structural: More than 4 times per year    Marital Status: Married  Catering manager Violence: Not on file    Family History  Problem Relation Age of Onset   Hypertension Mother    Diabetes Mother     Hypertension Father    Heart disease Father    Cancer Father    Prostate cancer Father    Hypertension Brother    Diabetes Brother    Diabetes Brother    Diabetes Other    Colon cancer Neg Hx    Rectal cancer Neg Hx    Stomach cancer Neg Hx    Esophageal cancer Neg Hx     Past Medical History:  Diagnosis Date   Allergy    Anxiety    Cancer (HCC)    basal cell; squamous cell   Climacteric    Depression    Fibrocystic breast    Hypercholesteremia    Hyperlipidemia    Hypertension    Thumb laceration, right, initial encounter    Vitamin D  deficiency     Patient Active Problem List   Diagnosis Date Noted   Telangiectasias 08/29/2023   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 05/30/2023   Adenoma determined by colorectal biopsy 05/30/2023   Encounter for weight management 03/03/2022   COVID 03/19/2020   Osteopenia 07/12/2019   History of adenomatous polyp of colon 04/04/2019   Fatty liver 03/01/2017   Depression, major,  recurrent, in complete remission (HCC) 05/02/2014   Medication management 10/09/2013   Hypertension    Hyperlipidemia    Vitamin D  deficiency     Past Surgical History:  Procedure Laterality Date   ABDOMINAL SURGERY  09/2016   tummy tuck   CESAREAN SECTION  1975 and 1980   I & D EXTREMITY Right 12/14/2018   Procedure: Right thumb nailbed repair, distal phalanx irrigation and debridement and closed reduction with percutaneous pinning;  Surgeon: Carolee Lynwood JINNY DOUGLAS, MD;  Location: [facility];  Service: Orthopedics;  Laterality: Right;  thumb    TUBAL LIGATION      Current Outpatient Medications  Medication Sig Dispense Refill   Cholecalciferol (VITAMIN D3) 125 MCG (5000 UT) TABS Take      1 capsule       Daily     MAGNESIUM PO Take by mouth.     Multiple Vitamin (MULTIVITAMIN WITH MINERALS) TABS tablet Take 1 tablet by mouth daily.     Omega-3 Fatty Acids (FISH OIL) 1000 MG CAPS Take by mouth.      phentermine  (ADIPEX-P ) 37.5 MG tablet TAKE 1/2 TO 1 TABLET BY MOUTH IN THE MORNING FOR DIETING AND WEIGHT LOSS 90 tablet 0   rosuvastatin  (CRESTOR ) 20 MG tablet Take 1 tablet (20 mg total) by mouth daily. for cholesterol. 90 tablet 2   Semaglutide -Weight Management (WEGOVY ) 0.25 MG/0.5ML SOAJ Inject 0.25 mg into the skin once a week. 2 mL 2   sertraline  (ZOLOFT ) 100 MG tablet TAKE 1 TABLET BY MOUTH ONCE DAILY FOR MOOD 90 tablet 1   topiramate  (TOPAMAX ) 50 MG tablet TAKE 1/2 TO 1 TABLET BY MOUTH TWICE DAILY AT SUPPERTIME AND BEDTIME FOR DIETING AND WEIGHT LOSS 180 tablet 1   Wheat Dextrin (BENEFIBER PO) Take by mouth.     Zinc 50 MG TABS Take by mouth daily.     atenolol  (TENORMIN ) 25 MG tablet Take 1 tablet by mouth once daily for blood pressure 90 tablet 0   hydrochlorothiazide  (HYDRODIURIL ) 25 MG tablet Take 1 tablet by mouth once daily 90 tablet 0   pregabalin  (LYRICA ) 50 MG capsule Take 1 capsule (50 mg total) by mouth 3 (three) times daily. 90 capsule 4   No current facility-administered medications for this visit.    Allergies as of 09/29/2023 - Review Complete 09/29/2023  Allergen Reaction Noted   Amoxicillin Rash 05/30/2023   Atorvastatin Nausea Only 12/01/2012    Vitals: BP 128/84   Pulse 71   Ht 5' 6 (1.676 m)   Wt 160 lb (72.6 kg)   BMI 25.82 kg/m  Last Weight:  Wt Readings from Last 1 Encounters:  09/29/23 160 lb (72.6 kg)   Last Height:   Ht Readings from Last 1 Encounters:  09/29/23 5' 6 (1.676 m)     Physical exam: Exam: Gen: NAD, conversant, well nourised, well groomed                     CV: RRR, no MRG. No Carotid Bruits. No peripheral edema, warm, nontender Eyes: Conjunctivae clear without exudates or hemorrhage  Neuro: Detailed Neurologic Exam  Speech:    Speech is normal; fluent and spontaneous with normal comprehension.  Cognition:    The patient is oriented to person, place, and time;     recent and remote memory intact;     language fluent;      normal attention, concentration,     fund of knowledge Cranial Nerves:  The pupils are equal, round, and reactive to light. The fundi could not be visualized bc pupils were too small. Visual fields are full to finger confrontation. Extraocular movements are intact. Trigeminal sensation is intact and the muscles of mastication are normal. The face is symmetric. The palate elevates in the midline. Hearing intact. Voice is normal. Shoulder shrug is normal. The tongue has normal motion without fasciculations.   Coordination: nml  Gait: nml  Motor Observation:    No asymmetry, no atrophy, and no involuntary movements noted. Tone:    Normal muscle tone.    Posture:    Posture is normal. normal erect    Strength:    Strength is V/V in the upper and lower limbs.      Sensation: intact to LT     Reflex Exam:  DTR's:    Deep tendon reflexes in the upper and lower extremities are normal bilaterally.   Toes:    The toes are downgoing bilaterally.   Clonus:    Clonus is absent.    Assessment/Plan:  Postherpetic neuralgia in V1, painful  Discussed Options: changes in medication:. We could change from gabapentin  IR to ER, cannot increase doses of IR due to sedation, tried topical lidocaine  and other treatment, tried OTC analgesics/nsaids, shampoo for 700 dollars, gabapentin  is first line failed IR. (Unfortunately ER was costly, can try Lyrica  instead, sent patient message)  Botox is possibly and option, however she has low brows and uses frontalis to lift up her eyebrows, and to the extent we would have to inject, would fear frontalis weakness affecting her - and insurance does not pay for botox use in this condition Peripheral nerve interventions? . I am going to send a referral to Dr. Rosine at [facility]. He has a specialty in trigeminal neuralgia and he does perform procedures like gamma knife or radiofrequency ablation on the trigeminal nerve. Given that patient's pain in the the  ophthalmic divison of the trigaminal nerve, I am unsure if he can perform these procedures there but it is a really good place to start and see if he can help in any way., lovely patient is really suffering.  Orders Placed This Encounter  Procedures   Ambulatory referral to Neurosurgery   Meds ordered this encounter  Medications   DISCONTD: Gabapentin  Enacarbil 600 MG TBCR    Sig: Take 1 tablet (600 mg total) by mouth at bedtime.    Dispense:  30 tablet    Refill:  11    Cc: Chandra Toribio POUR, MD,  Chandra Toribio POUR, MD  Onetha Epp, MD  [facility] [address] [place], [place] [address]  Phone [phone] Fax [phone]

## 2023-09-29 NOTE — Patient Instructions (Addendum)
 Refer to Dr. Rosine at [facility] Will inquire see what other headache specialists are doing Change the gabapentin  IR to ER can you can use both for several days. Can further increase the Gabapentin    Gabapentin  Enacarbil Extended-Release Tablets What is this medication? GABAPENTIN  (GA ba pen tin) treats nerve pain. It may also be used to treat restless legs syndrome (RLS). It works by calming overactive nerves in your body. This medicine may be used for other purposes; ask your health care provider or pharmacist if you have questions. COMMON BRAND NAME(S): Horizant  What should I tell my care team before I take this medication? They need to know if you have any of these conditions: Kidney disease Lung or breathing disease Substance use disorder Suicidal thoughts, plans, or attempt by you or a family member An unusual or allergic reaction to gabapentin , other medications, foods, dyes, or preservatives Pregnant or trying to get pregnant Breast-feeding How should I use this medication? Take this medication by mouth with a glass of water. Follow the directions on the prescription label. Take this medication with food. Do not cut, crush, or chew this medication. Take your medication at regular intervals. Do not take it more often than directed. Do not stop taking except on your care team's advice. A special MedGuide will be given to you by the pharmacist with each prescription and refill. Be sure to read this information carefully each time. Talk to your care team about the use of this medication in children. Special care may be needed. Overdosage: If you think you have taken too much of this medicine contact a poison control center or emergency room at once. NOTE: This medicine is only for you. Do not share this medicine with others. What if I miss a dose? If you miss a dose, wait and take the next dose at your regular time. Do not take double or extra doses. What may interact with this  medication? Alcohol Antihistamines for allergy, cough, and cold Certain medications for anxiety or sleep Certain medications for depression like amitriptyline, fluoxetine, sertraline  Certain medications for seizures like phenobarbital, primidone Certain medications for stomach problems General anesthetics like halothane, isoflurane, methoxyflurane, propofol Local anesthetics like lidocaine , pramoxine, tetracaine Medications that relax muscles for surgery Opioid medications for pain Phenothiazines like chlorpromazine, mesoridazine, prochlorperazine, thioridazine This list may not describe all possible interactions. Give your health care provider a list of all the medicines, herbs, non-prescription drugs, or dietary supplements you use. Also tell them if you smoke, drink alcohol, or use illegal drugs. Some items may interact with your medicine. What should I watch for while using this medication? Tell your care team if your symptoms do not start to get better or if they get worse. Do not stop taking except on your care team's advice. You may develop a severe reaction if you do. This medication may cause serious skin reactions. They can happen weeks to months after starting the medication. Contact your care team right away if you notice fevers or flu-like symptoms with a rash. The rash may be red or purple and then turn into blisters or peeling of the skin. Or, you might notice a red rash with swelling of the face, lips or lymph nodes in your neck or under your arms. This medication may affect your coordination, reaction time, or judgment. Do not drive or operate machinery until you know how this medication affects you. Sit up or stand slowly to reduce the risk of dizzy or fainting spells. Drinking alcohol  with this medication can increase the risk of these side effects. Watch for new or worsening thoughts of suicide or depression. This includes sudden changes in mood, behaviors, or thoughts. These  changes can happen at any time but are more common in the beginning of treatment or after a change in dose. Call your care team right away if you experience these thoughts or worsening depression. Your mouth may get dry. Chewing sugarless gum or sucking hard candy, and drinking plenty of water may help. What side effects may I notice from receiving this medication? Side effects that you should report to your care team as soon as possible: Allergic reactions or angioedema--skin rash, itching, hives, swelling of the face, eyes, lips, tongue, arms, or legs, trouble swallowing or breathing Rash, fever, and swollen lymph nodes Thoughts of suicide or self harm, worsening mood, feelings of depression Trouble breathing Side effects that usually do not require medical attention (report to your care team if they continue or are bothersome): Dizziness Drowsiness Headache This list may not describe all possible side effects. Call your doctor for medical advice about side effects. You may report side effects to FDA at 1-800-FDA-1088. Where should I keep my medication? Keep out of the reach of children and pets. Store between 15 and 30 degrees C (59 and 86 degrees F). Throw away any unused medication after the expiration date. Keep this medication dry and away from moisture. NOTE: This sheet is a summary. It may not cover all possible information. If you have questions about this medicine, talk to your doctor, pharmacist, or health care provider.  2024 Elsevier/Gold Standard (2020-10-24 00:00:00)

## 2023-09-29 NOTE — Telephone Encounter (Signed)
 Pt is calling to report that the cost of  Gabapentin  Enacarbil 600 MG TBCR  is over $500.00 Pt says Dr Ines informed her she would look for something else if that were to be the case.

## 2023-10-01 ENCOUNTER — Other Ambulatory Visit: Payer: Self-pay | Admitting: Family Medicine

## 2023-10-01 ENCOUNTER — Encounter: Payer: Self-pay | Admitting: Neurology

## 2023-10-01 DIAGNOSIS — I10 Essential (primary) hypertension: Secondary | ICD-10-CM

## 2023-10-01 DIAGNOSIS — R001 Bradycardia, unspecified: Secondary | ICD-10-CM

## 2023-10-02 ENCOUNTER — Other Ambulatory Visit: Payer: Self-pay | Admitting: Neurology

## 2023-10-02 DIAGNOSIS — B0229 Other postherpetic nervous system involvement: Secondary | ICD-10-CM

## 2023-10-02 MED ORDER — PREGABALIN 75 MG PO CAPS: 75.0000 mg | ORAL_CAPSULE | Freq: Two times a day (BID) | ORAL | 6 refills | Status: DC

## 2023-10-03 NOTE — Telephone Encounter (Signed)
 Sent in Lyrica  thanks

## 2023-10-11 ENCOUNTER — Other Ambulatory Visit: Payer: Self-pay | Admitting: Neurology

## 2023-10-11 DIAGNOSIS — B0229 Other postherpetic nervous system involvement: Secondary | ICD-10-CM

## 2023-10-11 MED ORDER — PREGABALIN 50 MG PO CAPS: 50.0000 mg | ORAL_CAPSULE | Freq: Three times a day (TID) | ORAL | 4 refills | Status: DC

## 2023-10-11 NOTE — Addendum Note (Signed)
 Addended by: Amiracle Neises B on: 10/11/2023 06:05 PM   Modules accepted: Orders

## 2023-10-12 ENCOUNTER — Telehealth: Payer: Self-pay | Admitting: Neurology

## 2023-10-12 NOTE — Telephone Encounter (Signed)
 Referral for neurosurgery fax to [facility] to see Dr. Yancy MICAEL Fare as requested by Neurologist. Phone: [phone], Fax: [phone]

## 2023-10-24 ENCOUNTER — Other Ambulatory Visit: Payer: Self-pay | Admitting: Neurology

## 2023-10-25 ENCOUNTER — Other Ambulatory Visit: Payer: Self-pay | Admitting: Neurology

## 2023-10-25 DIAGNOSIS — S50862A Insect bite (nonvenomous) of left forearm, initial encounter: Secondary | ICD-10-CM | POA: Diagnosis not present

## 2023-10-25 DIAGNOSIS — Z08 Encounter for follow-up examination after completed treatment for malignant neoplasm: Secondary | ICD-10-CM | POA: Diagnosis not present

## 2023-10-25 DIAGNOSIS — L578 Other skin changes due to chronic exposure to nonionizing radiation: Secondary | ICD-10-CM | POA: Diagnosis not present

## 2023-10-25 DIAGNOSIS — Z85828 Personal history of other malignant neoplasm of skin: Secondary | ICD-10-CM | POA: Diagnosis not present

## 2023-10-25 DIAGNOSIS — D1801 Hemangioma of skin and subcutaneous tissue: Secondary | ICD-10-CM | POA: Diagnosis not present

## 2023-10-25 DIAGNOSIS — L814 Other melanin hyperpigmentation: Secondary | ICD-10-CM | POA: Diagnosis not present

## 2023-10-25 DIAGNOSIS — L821 Other seborrheic keratosis: Secondary | ICD-10-CM | POA: Diagnosis not present

## 2023-10-25 MED ORDER — CARBAMAZEPINE ER 100 MG PO TB12: 100.0000 mg | ORAL_TABLET | Freq: Two times a day (BID) | ORAL | 6 refills | Status: DC

## 2023-11-10 ENCOUNTER — Encounter: Payer: Self-pay | Admitting: Neurology

## 2023-11-10 ENCOUNTER — Other Ambulatory Visit: Payer: Self-pay | Admitting: Neurology

## 2023-11-10 MED ORDER — GABAPENTIN 300 MG PO CAPS: 300.0000 mg | ORAL_CAPSULE | Freq: Three times a day (TID) | ORAL | 0 refills | Status: DC

## 2023-11-10 NOTE — Telephone Encounter (Signed)
Gabapentin 300 mg TID sent to pharmacy.

## 2023-12-05 DIAGNOSIS — G500 Trigeminal neuralgia: Secondary | ICD-10-CM | POA: Diagnosis not present

## 2023-12-06 ENCOUNTER — Telehealth: Payer: Self-pay | Admitting: Family Medicine

## 2023-12-06 ENCOUNTER — Other Ambulatory Visit: Payer: Self-pay | Admitting: Family Medicine

## 2023-12-06 MED ORDER — ROSUVASTATIN CALCIUM 20 MG PO TABS: 20.0000 mg | ORAL_TABLET | Freq: Every day | ORAL | 3 refills | Status: DC

## 2023-12-06 NOTE — Telephone Encounter (Unsigned)
 Copied from CRM #[id number]. Topic: Clinical - Medication Refill >> Dec 06, 2023  9:46 AM Zebedee SAUNDERS wrote: Medication: rosuvastatin  (CRESTOR ) 20 MG tablet  Has the patient contacted their pharmacy? Yes (Agent: If no, request that the patient contact the pharmacy for the refill. If patient does not wish to contact the pharmacy document the reason why and proceed with request.) (Agent: If yes, when and what did the pharmacy advise?)  This is the patient's preferred pharmacy:  [facility] [phone] [address], [place] - [address] [place] [address] Phone: [phone] Fax: [phone]  Is this the correct pharmacy for this prescription? Yes If no, delete pharmacy and type the correct one.   Has the prescription been filled recently? Yes  Is the patient out of the medication? Yes  Has the patient been seen for an appointment in the last year OR does the patient have an upcoming appointment? Yes  Can we respond through MyChart? Yes  Agent: Please be advised that Rx refills may take up to 3 business days. We ask that you follow-up with your pharmacy.

## 2023-12-07 DIAGNOSIS — B0222 Postherpetic trigeminal neuralgia: Secondary | ICD-10-CM | POA: Diagnosis not present

## 2023-12-07 MED ORDER — ROSUVASTATIN CALCIUM 20 MG PO TABS: 20.0000 mg | ORAL_TABLET | Freq: Every day | ORAL | 3 refills | Status: AC

## 2023-12-07 NOTE — Addendum Note (Signed)
 Addended by: CHANDRA TORIBIO POUR on: 12/07/2023 06:35 AM   Modules accepted: Orders

## 2023-12-08 ENCOUNTER — Ambulatory Visit: Payer: Medicare Other | Admitting: Nurse Practitioner

## 2023-12-20 HISTORY — PX: OTHER SURGICAL HISTORY: SHX169

## 2024-01-10 ENCOUNTER — Other Ambulatory Visit: Payer: Self-pay | Admitting: Family Medicine

## 2024-01-10 MED ORDER — GABAPENTIN 300 MG PO CAPS: 300.0000 mg | ORAL_CAPSULE | Freq: Three times a day (TID) | ORAL | 0 refills | Status: DC

## 2024-01-10 NOTE — Telephone Encounter (Signed)
 Copied from CRM #[id number]. Topic: Clinical - Medication Refill >> Jan 10, 2024  1:33 PM Amy B wrote: Medication:  gabapentin  (NEURONTIN ) 300 MG capsule  Has the patient contacted their pharmacy? No (Agent: If no, request that the patient contact the pharmacy for the refill. If patient does not wish to contact the pharmacy document the reason why and proceed with request.) (Agent: If yes, when and what did the pharmacy advise?)  This is the patient's preferred pharmacy:  [phone] [address], [place] - [address] [place] [address] Phone: [phone] Fax: [phone]  Is this the correct pharmacy for this prescription? Yes If no, delete pharmacy and type the correct one.   Has the prescription been filled recently? No  Is the patient out of the medication? No  Has the patient been seen for an appointment in the last year OR does the patient have an upcoming appointment? Yes  Can we respond through MyChart? No  Agent: Please be advised that Rx refills may take up to 3 business days. We ask that you follow-up with your pharmacy.

## 2024-01-26 ENCOUNTER — Ambulatory Visit (INDEPENDENT_AMBULATORY_CARE_PROVIDER_SITE_OTHER)

## 2024-01-26 DIAGNOSIS — Z0000 Encounter for general adult medical examination without abnormal findings: Secondary | ICD-10-CM | POA: Diagnosis not present

## 2024-01-26 NOTE — Patient Instructions (Signed)
 Ruth Ward,  Thank you for taking the time for your Medicare Wellness Visit. I appreciate your continued commitment to your health goals. Please review the care plan we discussed, and feel free to reach out if I can assist you further.  Please note that Annual Wellness Visits do not include a physical exam. Some assessments may be limited, especially if the visit was conducted virtually. If needed, we may recommend an in-person follow-up with your provider.  Ongoing Care Seeing your primary care provider every 3 to 6 months helps us  monitor your health and provide consistent, personalized care.   Referrals If a referral was made during today's visit and you haven't received any updates within two weeks, please contact the referred provider directly to check on the status.  Recommended Screenings:  Health Maintenance  Topic Date Due   Medicare Annual Wellness Visit  05/26/2023   COVID-19 Vaccine (8 - 2025-26 season) 10/17/2023   Breast Cancer Screening  07/25/2024   Colon Cancer Screening  08/25/2026   DTaP/Tdap/Td vaccine (4 - Td or Tdap) 06/07/2033   Pneumococcal Vaccine for age over 69  Completed   Flu Shot  Completed   Osteoporosis screening with Bone Density Scan  Completed   Hepatitis C Screening  Completed   Zoster (Shingles) Vaccine  Completed   Meningitis B Vaccine  Aged Out       01/22/2024    1:35 PM  Advanced Directives  Does Patient Have a Medical Advance Directive? No    Vision: Annual vision screenings are recommended for early detection of glaucoma, cataracts, and diabetic retinopathy. These exams can also reveal signs of chronic conditions such as diabetes and high blood pressure.  Dental: Annual dental screenings help detect early signs of oral cancer, gum disease, and other conditions linked to overall health, including heart disease and diabetes.  Please see the attached documents for additional preventive care recommendations.

## 2024-01-26 NOTE — Progress Notes (Signed)
 Chief Complaint  Patient presents with   Medicare Wellness     Subjective:   Ruth Ward is a 68 y.o. female who presents for a Medicare Annual Wellness Visit.  Visit info / Clinical Intake: Medicare Wellness Visit Type:: Subsequent Annual Wellness Visit Persons participating in visit and providing information:: patient Medicare Wellness Visit Mode:: Telephone If telephone:: video declined Since this visit was completed virtually, some vitals may be partially provided or unavailable. Missing vitals are due to the limitations of the virtual format.: Unable to obtain vitals - no equipment If Telephone or Video please confirm:: I connected with patient using audio/video enable telemedicine. I verified patient identity with two identifiers, discussed telehealth limitations, and patient agreed to proceed. Patient Location:: home Provider Location:: home office Interpreter Needed?: No Pre-visit prep was completed: yes AWV questionnaire completed by patient prior to visit?: yes Date:: 01/22/24 Living arrangements:: (Patient-Rptd) lives with spouse/significant other Patient's Overall Health Status Rating: (Patient-Rptd) excellent Typical amount of pain: (Patient-Rptd) none Does pain affect daily life?: (Patient-Rptd) no Are you currently prescribed opioids?: no  Dietary Habits and Nutritional Risks How many meals a day?: (Patient-Rptd) 3 Eats fruit and vegetables daily?: (Patient-Rptd) yes Most meals are obtained by: (Patient-Rptd) preparing own meals In the last 2 weeks, have you had any of the following?: none Diabetic:: no  Functional Status Activities of Daily Living (to include ambulation/medication): (Patient-Rptd) Independent Ambulation: (Patient-Rptd) Independent Medication Administration: (Patient-Rptd) Independent Home Management (perform basic housework or laundry): (Patient-Rptd) Independent Manage your own finances?: (Patient-Rptd) yes Primary transportation  is: (Patient-Rptd) driving Concerns about vision?: no *vision screening is required for WTM* Concerns about hearing?: no  Fall Screening Falls in the past year?: (Patient-Rptd) 0 Number of falls in past year: 0 Was there an injury with Fall?: 0 Fall Risk Category Calculator: 0 Patient Fall Risk Level: Low Fall Risk  Fall Risk Patient at Risk for Falls Due to: Medication side effect Fall risk Follow up: Falls prevention discussed; Falls evaluation completed  Home and Transportation Safety: All rugs have non-skid backing?: (Patient-Rptd) yes All stairs or steps have railings?: (Patient-Rptd) yes Grab bars in the bathtub or shower?: (!) (Patient-Rptd) no Have non-skid surface in bathtub or shower?: (Patient-Rptd) yes Good home lighting?: (Patient-Rptd) yes Regular seat belt use?: (Patient-Rptd) yes Hospital stays in the last year:: (Patient-Rptd) no  Cognitive Assessment Difficulty concentrating, remembering, or making decisions? : (Patient-Rptd) no Will 6CIT or Mini Cog be Completed: yes What year is it?: 0 points What month is it?: 0 points Give patient an address phrase to remember (5 components): [address] About what time is it?: 0 points Count backwards from 20 to 1: 0 points Say the months of the year in reverse: 0 points Repeat the address phrase from earlier: 2 points 6 CIT Score: 2 points  Advance Directives (For Healthcare) Does Patient Have a Medical Advance Directive?: No  Reviewed/Updated  Reviewed/Updated: Reviewed All (Medical, Surgical, Family, Medications, Allergies, Care Teams, Patient Goals)    Allergies (verified) Amoxicillin and Atorvastatin   Current Medications (verified) Outpatient Encounter Medications as of 01/26/2024  Medication Sig   atenolol  (TENORMIN ) 25 MG tablet Take 1 tablet by mouth once daily for blood pressure   Cholecalciferol (VITAMIN D3) 125 MCG (5000 UT) TABS Take      1 capsule       Daily   gabapentin  (NEURONTIN )  300 MG capsule Take 1 capsule (300 mg total) by mouth 3 (three) times daily.   hydrochlorothiazide  (HYDRODIURIL )  25 MG tablet Take 1 tablet by mouth once daily   MAGNESIUM PO Take by mouth.   Multiple Vitamin (MULTIVITAMIN WITH MINERALS) TABS tablet Take 1 tablet by mouth daily.   Omega-3 Fatty Acids (FISH OIL) 1000 MG CAPS Take by mouth.   phentermine  (ADIPEX-P ) 37.5 MG tablet TAKE 1/2 TO 1 TABLET BY MOUTH IN THE MORNING FOR DIETING AND WEIGHT LOSS   rosuvastatin  (CRESTOR ) 20 MG tablet Take 1 tablet (20 mg total) by mouth daily. for cholesterol.   Semaglutide -Weight Management (WEGOVY ) 0.25 MG/0.5ML SOAJ Inject 0.25 mg into the skin once a week.   sertraline  (ZOLOFT ) 100 MG tablet TAKE 1 TABLET BY MOUTH ONCE DAILY FOR MOOD   topiramate  (TOPAMAX ) 50 MG tablet TAKE 1/2 TO 1 TABLET BY MOUTH TWICE DAILY AT SUPPERTIME AND BEDTIME FOR DIETING AND WEIGHT LOSS   Zinc 50 MG TABS Take by mouth daily.   carbamazepine  (TEGRETOL -XR) 100 MG 12 hr tablet Take 1 tablet (100 mg total) by mouth 2 (two) times daily.   Wheat Dextrin (BENEFIBER PO) Take by mouth.   No facility-administered encounter medications on file as of 01/26/2024.    History: Past Medical History:  Diagnosis Date   Allergy    Anxiety    Cancer (HCC)    basal cell; squamous cell   Climacteric    Depression    Fibrocystic breast    Hypercholesteremia    Hyperlipidemia    Hypertension    Thumb laceration, right, initial encounter    Vitamin D  deficiency    Past Surgical History:  Procedure Laterality Date   ABDOMINAL SURGERY  09/2016   tummy tuck   CESAREAN SECTION  1975 and 1980   gamma knife  12/20/2023   Dr. Yancy Fare   I & D EXTREMITY Right 12/14/2018   Procedure: Right thumb nailbed repair, distal phalanx irrigation and debridement and closed reduction with percutaneous pinning;  Surgeon: Carolee Lynwood JINNY DOUGLAS, MD;  Location: [facility];  Service: Orthopedics;  Laterality: Right;  thumb    TUBAL  LIGATION     Family History  Problem Relation Age of Onset   Hypertension Mother    Diabetes Mother    Hypertension Father    Heart disease Father    Cancer Father    Prostate cancer Father    Hypertension Brother    Diabetes Brother    Diabetes Brother    Diabetes Other    Colon cancer Neg Hx    Rectal cancer Neg Hx    Stomach cancer Neg Hx    Esophageal cancer Neg Hx    Social History   Occupational History   Not on file  Tobacco Use   Smoking status: Former    Current packs/day: 0.00    Types: Cigarettes    Quit date: 02/15/1986    Years since quitting: 37.9   Smokeless tobacco: Never  Vaping Use   Vaping status: Never Used  Substance and Sexual Activity   Alcohol use: No   Drug use: No   Sexual activity: Not on file   Tobacco Counseling Counseling given: Not Answered  SDOH Screenings   Food Insecurity: No Food Insecurity (01/22/2024)  Housing: Unknown (01/22/2024)  Transportation Needs: No Transportation Needs (01/22/2024)  Utilities: Not At Risk (01/26/2024)  Alcohol Screen: Low Risk (01/22/2024)  Depression (PHQ2-9): Low Risk (01/26/2024)  Financial Resource Strain: Patient Declined (01/22/2024)  Physical Activity: Insufficiently Active (01/22/2024)  Social Connections: Socially Integrated (01/22/2024)  Stress: No Stress Concern Present (01/22/2024)  Tobacco Use: Medium Risk (01/26/2024)  Health Literacy: Adequate Health Literacy (01/26/2024)   See flowsheets for full screening details  Depression Screen PHQ 2 & 9 Depression Scale- Over the past 2 weeks, how often have you been bothered by any of the following problems? Little interest or pleasure in doing things: 0 Feeling down, depressed, or hopeless (PHQ Adolescent also includes...irritable): 0 PHQ-2 Total Score: 0 Trouble falling or staying asleep, or sleeping too much: 0 Feeling tired or having little energy: 0 Poor appetite or overeating (PHQ Adolescent also includes...weight loss): 0 Feeling bad  about yourself - or that you are a failure or have let yourself or your family down: 0 Trouble concentrating on things, such as reading the newspaper or watching television (PHQ Adolescent also includes...like school work): 0 Moving or speaking so slowly that other people could have noticed. Or the opposite - being so fidgety or restless that you have been moving around a lot more than usual: 0 Thoughts that you would be better off dead, or of hurting yourself in some way: 0 PHQ-9 Total Score: 0 If you checked off any problems, how difficult have these problems made it for you to do your work, take care of things at home, or get along with other people?: Not difficult at all  Depression Treatment Depression Interventions/Treatment : EYV7-0 Score <4 Follow-up Not Indicated     Goals Addressed             This Visit's Progress    Patient Stated       01/26/2024, wants to lose weight             Objective:    Today's Vitals   There is no height or weight on file to calculate BMI.  Hearing/Vision screen Hearing Screening - Comments:: Denies hearing issues Vision Screening - Comments:: Regular eye exams, Dr. Medford Gaudy Immunizations and Health Maintenance Health Maintenance  Topic Date Due   COVID-19 Vaccine (8 - 2025-26 season) 10/17/2023   Mammogram  07/25/2024   Medicare Annual Wellness (AWV)  01/25/2025   Colonoscopy  08/25/2026   DTaP/Tdap/Td (4 - Td or Tdap) 06/07/2033   Pneumococcal Vaccine: 50+ Years  Completed   Influenza Vaccine  Completed   Bone Density Scan  Completed   Hepatitis C Screening  Completed   Zoster Vaccines- Shingrix  Completed   Meningococcal B Vaccine  Aged Out        Assessment/Plan:  This is a routine wellness examination for Ruth Ward.  Patient Care Team: Chandra Toribio POUR, MD as PCP - General (Family Medicine) Rosine Yancy ORN (Neurosurgery)  I have personally reviewed and noted the following in the patients chart:   Medical and  social history Use of alcohol, tobacco or illicit drugs  Current medications and supplements including opioid prescriptions. Functional ability and status Nutritional status Physical activity Advanced directives List of other physicians Hospitalizations, surgeries, and ER visits in previous 12 months Vitals Screenings to include cognitive, depression, and falls Referrals and appointments  No orders of the defined types were placed in this encounter.  In addition, I have reviewed and discussed with patient certain preventive protocols, quality metrics, and best practice recommendations. A written personalized care plan for preventive services as well as general preventive health recommendations were provided to patient.   Ruth Ward Dawn, LPN   [id number]/7974   Return in 1 year (on 01/25/2025).  After Visit Summary: (MyChart) Due to this being a telephonic visit, the after visit summary with patients  personalized plan was offered to patient via MyChart   Nurse Notes: No voiced or noted concerns at this time

## 2024-02-29 ENCOUNTER — Encounter: Payer: Self-pay | Admitting: Family Medicine

## 2024-02-29 ENCOUNTER — Ambulatory Visit (INDEPENDENT_AMBULATORY_CARE_PROVIDER_SITE_OTHER): Admitting: Family Medicine

## 2024-02-29 VITALS — BP 138/84 | HR 75 | Temp 98.4°F | Ht 66.0 in | Wt 175.0 lb

## 2024-02-29 DIAGNOSIS — Z7689 Persons encountering health services in other specified circumstances: Secondary | ICD-10-CM

## 2024-02-29 DIAGNOSIS — R059 Cough, unspecified: Secondary | ICD-10-CM | POA: Insufficient documentation

## 2024-02-29 DIAGNOSIS — B0222 Postherpetic trigeminal neuralgia: Secondary | ICD-10-CM

## 2024-02-29 DIAGNOSIS — R052 Subacute cough: Secondary | ICD-10-CM

## 2024-02-29 NOTE — Patient Instructions (Signed)
 It was nice to see you today,  We addressed the following topics today: - I have given you a list of the new prices for wegovy .  If you would like us  to order it let me know.  - send in a picture of your doses of the wegovy  you are taking currently and the previous compounded medication - if you have any worsening of your cough or it is not better after two weeks message us  and I will send in an antibiotic if needed.   Have a great day,  Rolan Slain, MD

## 2024-02-29 NOTE — Assessment & Plan Note (Signed)
 Significant improvement overall since gammaknife procedure in November.

## 2024-02-29 NOTE — Assessment & Plan Note (Signed)
 Weight has increased since last visit.  Discussed lower prices for direct pay of wegovy  and new oral form.  Pt will consider this and let us  know if she would like us  to send in a prescription.  Continue topamax  and phentermine 

## 2024-02-29 NOTE — Assessment & Plan Note (Signed)
 Continued cough 3 weeks after initial respiratory infection.  Overall improving. Exam reassuring. Continue to monitor.

## 2024-02-29 NOTE — Progress Notes (Signed)
" ° °  Established Patient Office Visit  Subjective   Patient ID: Ruth Ward, female    DOB: 1955/07/12  Age: 69 y.o. MRN: [id number]  Chief Complaint  Patient presents with   Medical Management of Chronic Issues    HPI  - discussed weight loss medications.  Pt has gained weight since last visit.  No longer taking her compounded semaglutide .  Is taking her topamax  and phentermine   - has had persistent cough since an initial viral illness on dec 27th.  Overall improving but still has productive cough.  Taking mucinex.   - has had singificant improvement in neuralgia since gammaknife procedure in November.  Symptoms did get worse when she caught her respiratory infection but are getting better and still better overall than prior to procedure.    The 10-year ASCVD risk score (Arnett DK, et al., 2019) is: 13%  Health Maintenance Due  Topic Date Due   COVID-19 Vaccine (8 - 2025-26 season) 10/17/2023      Objective:     BP 138/84   Pulse 75   Temp 98.4 F (36.9 C)   Ht 5' 6 (1.676 m)   Wt 175 lb (79.4 kg)   SpO2 98%   BMI 28.25 kg/m    Physical Exam Gen: alert, oriented Cv: rrr Pulm: no respiratory distress.  No wheezes , rhonchi or rales   Psych: pleasant affect   No results found for any visits on 02/29/24.      Assessment & Plan:   Encounter for weight management Assessment & Plan: Weight has increased since last visit.  Discussed lower prices for direct pay of wegovy  and new oral form.  Pt will consider this and let us  know if she would like us  to send in a prescription.  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     Subacute cough Assessment & Plan: Continued cough 3 weeks after initial respiratory infection.  Overall improving. Exam reassuring. Continue to monitor.       Return in about 6 months  (around 08/28/2024) for physical.    Toribio MARLA Slain, MD "

## 2024-03-01 ENCOUNTER — Other Ambulatory Visit: Payer: Self-pay | Admitting: Family Medicine

## 2024-03-15 ENCOUNTER — Encounter: Payer: Medicare Other | Admitting: Internal Medicine

## 2024-03-16 ENCOUNTER — Other Ambulatory Visit: Payer: Self-pay | Admitting: Family Medicine

## 2024-03-16 DIAGNOSIS — E663 Overweight: Secondary | ICD-10-CM

## 2024-03-16 DIAGNOSIS — I10 Essential (primary) hypertension: Secondary | ICD-10-CM

## 2024-03-16 DIAGNOSIS — R001 Bradycardia, unspecified: Secondary | ICD-10-CM

## 2024-03-16 DIAGNOSIS — F3342 Major depressive disorder, recurrent, in full remission: Secondary | ICD-10-CM

## 2024-03-23 ENCOUNTER — Encounter: Payer: Self-pay | Admitting: Family Medicine

## 2024-08-23 ENCOUNTER — Other Ambulatory Visit

## 2024-08-30 ENCOUNTER — Encounter: Admitting: Family Medicine
